# Patient Record
Sex: Male | Born: 1935 | Race: White | Hispanic: No | Marital: Married | Smoking: Former smoker
Health system: Southern US, Community
[De-identification: ages and names within clinical notes are randomized; demographics above are authoritative.]

## PROBLEM LIST (undated history)

## (undated) ENCOUNTER — Emergency Department (HOSPITAL_COMMUNITY): Payer: No Typology Code available for payment source | Source: Home / Self Care

## (undated) DIAGNOSIS — E119 Type 2 diabetes mellitus without complications: Secondary | ICD-10-CM

## (undated) DIAGNOSIS — R350 Frequency of micturition: Secondary | ICD-10-CM

## (undated) DIAGNOSIS — I10 Essential (primary) hypertension: Secondary | ICD-10-CM

## (undated) DIAGNOSIS — N400 Enlarged prostate without lower urinary tract symptoms: Secondary | ICD-10-CM

## (undated) DIAGNOSIS — I2510 Atherosclerotic heart disease of native coronary artery without angina pectoris: Secondary | ICD-10-CM

## (undated) DIAGNOSIS — R011 Cardiac murmur, unspecified: Secondary | ICD-10-CM

## (undated) DIAGNOSIS — K219 Gastro-esophageal reflux disease without esophagitis: Secondary | ICD-10-CM

## (undated) DIAGNOSIS — E7800 Pure hypercholesterolemia, unspecified: Secondary | ICD-10-CM

## (undated) DIAGNOSIS — H409 Unspecified glaucoma: Secondary | ICD-10-CM

## (undated) HISTORY — PX: BACK SURGERY: SHX140

## (undated) HISTORY — PX: LEG SURGERY: SHX1003

## (undated) HISTORY — PX: HERNIA REPAIR: SHX51

---

## 2000-08-19 ENCOUNTER — Emergency Department (HOSPITAL_COMMUNITY): Admission: EM | Admit: 2000-08-19 | Discharge: 2000-08-20 | Payer: Self-pay | Admitting: Emergency Medicine

## 2000-09-01 ENCOUNTER — Encounter: Payer: Self-pay | Admitting: Neurosurgery

## 2000-09-02 ENCOUNTER — Encounter: Payer: Self-pay | Admitting: Neurosurgery

## 2000-09-02 ENCOUNTER — Inpatient Hospital Stay (HOSPITAL_COMMUNITY): Admission: RE | Admit: 2000-09-02 | Discharge: 2000-09-03 | Payer: Self-pay | Admitting: Neurosurgery

## 2000-11-15 ENCOUNTER — Encounter: Payer: Self-pay | Admitting: Orthopaedic Surgery

## 2000-11-15 ENCOUNTER — Encounter: Admission: RE | Admit: 2000-11-15 | Discharge: 2000-11-15 | Payer: Self-pay | Admitting: Orthopaedic Surgery

## 2003-12-03 ENCOUNTER — Encounter (INDEPENDENT_AMBULATORY_CARE_PROVIDER_SITE_OTHER): Payer: Self-pay | Admitting: Specialist

## 2003-12-03 ENCOUNTER — Observation Stay (HOSPITAL_COMMUNITY): Admission: RE | Admit: 2003-12-03 | Discharge: 2003-12-04 | Payer: Self-pay | Admitting: General Surgery

## 2009-03-06 ENCOUNTER — Ambulatory Visit (HOSPITAL_COMMUNITY): Admission: RE | Admit: 2009-03-06 | Discharge: 2009-03-06 | Payer: Self-pay | Admitting: Orthopaedic Surgery

## 2010-10-02 LAB — COMPREHENSIVE METABOLIC PANEL
ALT: 20 U/L (ref 0–53)
AST: 30 U/L (ref 0–37)
Calcium: 9.6 mg/dL (ref 8.4–10.5)
GFR calc Af Amer: >60 mL/min (ref >60)
Sodium: 137 mEq/L (ref 135–145)
Total Protein: 7 g/dL (ref 6.0–8.3)

## 2010-10-02 LAB — GLUCOSE, CAPILLARY
Glucose-Capillary: 119 mg/dL — ABNORMAL HIGH (ref 70–99)
Glucose-Capillary: 73 mg/dL (ref 70–99)
Glucose-Capillary: 97 mg/dL (ref 70–99)

## 2010-10-02 LAB — CBC
HCT: 42.3 % (ref 39.0–52.0)
Hemoglobin: 14.2 g/dL (ref 13.0–17.0)
MCHC: 33.5 g/dL (ref 30.0–36.0)
MCV: 90 fL (ref 78.0–100.0)
Platelets: 275 10*3/uL (ref 150–400)
RBC: 4.7 MIL/uL (ref 4.22–5.81)
RDW: 15.1 % (ref 11.5–15.5)
WBC: 7.5 10*3/uL (ref 4.0–10.5)

## 2010-10-02 LAB — PROTIME-INR: INR: 0.9 (ref 0.00–1.49)

## 2010-10-02 LAB — DIFFERENTIAL
Eosinophils Absolute: 0.1 10*3/uL (ref 0.0–0.7)
Eosinophils Relative: 1 % (ref 0–5)
Lymphs Abs: 0.7 10*3/uL (ref 0.7–4.0)
Monocytes Relative: 12 % (ref 3–12)

## 2010-11-13 NOTE — H&P (Signed)
[place]  Patient:    Kurt Navarro, Kurt Navarro Visit Number: [id number] MRN: [id number]          Service Type: SUR Location: 3000 [id number] Attending Physician:  Danella Penton Admit Date:  09/02/2000                           History and Physical  BRIEF HISTORY:  Mr. Saetern is a gentleman who was in my office less than 1 week ago with a 1 month history of back pain with radiation down to the groin and then to the right leg which is getting worse.  What happened was, he was at work, he was crawling and doing some type of work underneath the house.  The pain was so severe up to the point that he was seen by his medical physician who proceeded with course of treatment without improvement.  Because of that an MRI was obtained and sent to [place] for evaluation.  He denies any pain in the left leg related to bowel or bladder.  PAST MEDICAL HISTORY:  About 3 years fracture the L1 with some stenosis at that level.  Off and on he complains of some back pain but the pain that he is having right now is completely different.  REVIEW OF SYSTEMS:  Positive for high blood pressure, leg pain, back pain.  He has a history of premature heart contractions but according to him his physician told him it is no big deal.  FAMILY HISTORY:  Mother is 75 and in good condition.  Father died at the age of 38 with a heart attack.  SOCIAL HISTORY:  Does not smoke, does not drink.  MEDICATIONS:  He is taking Cardura.  PHYSICAL EXAMINATION:  GENERAL:  The patient came to my office with main complaint in the right leg. He had quite a bit of difficulty undressing.  HEENT:  Normal.  NECK:  Normal.  NODES:  Clear.  HEART:  Sounds normal.  ABDOMEN:  Normal.  EXTREMITIES:  Normal pulses.  NEUROLOGIC:  Mental status:  Normal.  Cranial nerves:  Normal.  Strength is 5/5 except that in the right leg I found weakness of the right iliopsoas and the quadriceps.  Distal is  normal.  The left leg is normal.  Reflexes are symmetrical without decrease of the right knee jerk.  Sterile dressing on the left side is positive at 80, right side about 45 degrees.  Femoral stretch maneuver is highly positive on the right side and negative on the left side. The MRI showed that he has an old fracture of L1, but at the level of 3-4 especially going to the right side he has a large herniated disk extending into the body of L5 and also into the body of L4 affecting the L4 and L5 nerve root.  There is displacement of the thecal sac.  CLINICAL IMPRESSION: 1. Right L3-4 herniated disk. 2. Fracture of L1 - old.  RECOMMENDATIONS:  The patient wants to go ahead with surgery.  The procedure will be a right L3-4 diskectomy.  He knows about the risks, such as infection, CSF lead, worsening pain, paralysis, need for further surgery. Attending Physician:  Danella Penton DD:  09/02/00 TD:  09/03/00 Job: [id number]

## 2010-11-13 NOTE — Op Note (Signed)
[place] [place]  Patient:    Kurt Navarro, Kurt Navarro Visit Number: [id number] MRN: [id number]          Service Type: SUR Location: 3000 [id number] Attending Physician:  Danella Penton Proc. Date: 09/02/00 Admit Date:  09/02/2000 Discharge Date: 09/03/2000                             Operative Report  PREOPERATIVE DIAGNOSIS:   Right L3-L4 herniated disk with L3 fragment.  POSTOPERATIVE DIAGNOSIS:  Right L3-L4 herniated disk with L3 fragment.  OPERATION:  Right L3 hemilaminectomy, removal of L3 fragment, foraminotomy, decompression of the L3 nerve root.  Microscope.  SURGEON:  Tanya Nones. Jeral Fruit, M.D.  ASSISTANT:  Hewitt Shorts, M.D.  INDICATIONS:  [name] is admitted because of back and right leg pain.  He had weakness of because of back and right leg pain.  He had weakness of the quadriceps and the psoas.  MRI showed a large herniated disk at the level of L3-L4 with fragment going to the body of L4.  Surgery was advised.  The patient knew of the risk such as infection, CSF leak, worsening of the pain, paralysis and need for further surgery.  DESCRIPTION OF PROCEDURE:  The patient was taken to the operating room, properly positioned and placed on the monitor.  The lumbar area was prepped with Betadine.  A midline incision from L3 to L4 was made.  Muscles were retracted on the right side.  We took an x-ray that showed indeed we were at the level of L3.  From then on we went down and localized L3-L4.  By x-ray we knew there was a large herniated disk.  We proceeded with hemilaminectomy of L3 and partial of L4.  This was done with the microscope and the drill.  With the microscope, with microdissection we removed the yellow ligament.  We identified the L3 nerve root and later the L4.  Indeed, at the level of L3, there were at least four fragments going to the foramina affecting the L3 nerve root.  We went above into the shoulder of the nerve root  and we found at least two more.  We localized the L3-4 disk and indeed there was some bulging but there was no evidence of any opening.  Because of that, we decided not to do any diskectomy.  Foraminotomy with decompression of the L3-L4 nerve root was achieved.  Balfour maneuver was negative.  The wound was closed with Vicryl and nylon.  The patient did well. Attending Physician:  Danella Penton DD:  09/02/00 TD:  09/04/00 Job: [id number]

## 2010-11-13 NOTE — Op Note (Signed)
Kurt Navarro, Kurt Navarro                            ACCOUNT NO.:  [id number]   MEDICAL RECORD NO.:  [id number]                   PATIENT TYPE:  AMB   LOCATION:  DAY                                  FACILITY[facility]   PHYSICIAN:  Timothy E. Earlene Plater, M.D.              DATE OF BIRTH:  18-May-1927   DATE OF PROCEDURE:  12/03/2003  DATE OF DISCHARGE:                                 OPERATIVE REPORT   PREOPERATIVE DIAGNOSES:  1. Biliary dyskinesia.  2. Chronic cholecystitis.   PREOPERATIVE DIAGNOSIS:  1. Biliary dyskinesia.  2. Chronic cholecystitis.   OPERATION/PROCEDURE:  Laparoscopic cholecystectomy and operative  cholangiogram.   SURGEON:  Timothy E. Earlene Plater, M.D.   ASSISTANT:  Vikki Ports, M.D.   ANESTHESIA:  CRNA supervised by M.D.   INDICATIONS:  Kurt Navarro is 63.  Has been seen, evaluated and worked up by his  internist and gastroenterologist in [place] and found to have biliary  dyskinesia.  After review of his data and a careful discussion with him and  his family, he wishes to proceed with a laparoscopic cholecystectomy.  Today  his CBC and chemistry profile are normal.  He is seen, identified and the  permit signed.   DESCRIPTION OF PROCEDURE:  The patient is taken to the operating room and  placed supine. General endotracheal anesthesia administered.  The abdomen  was shaved, prepped and draped in the usual fashion.  Marcaine 0.25% with  epinephrine was used prior to each incision.   An infraumbilical incision made, the fascia identified, opened vertically,  peritoneum entered.  The Hasson catheter placed and tied in place with a #1  Vicryl.  General peritoneoscopy was unremarkable.  A second 10 mm trocar was  placed in the mid epigastrium.  Two 5 mm trocars in the right upper  quadrant.  The gallbladder was tense, did have some fatty adhesions.  It was  punctured by the first forceps applied on the dome.  The bile was suctioned  away.  The gallbladder was then  placed on traction.  Careful dissection at  the base of the gallbladder revealed a normal appearing, small cystic duct  entering the gallbladder.  This was completely dissected out with a full  window posteriorly and a clip applied on the gallbladder side of the cystic  duct.  An incision made in the cystic duct and a percutaneous catheter  placed into the cystic duct.  A real time cholangiogram was made showing  quick and rapid emptying of dye into the duodenum with full filling of the  normal biliary tree.  The clip and catheter were removed.  The stump of the  cystic duct was doubly clipped, the cystic duct divided.  The cystic artery  identified posteriorly, was dissected free, triply clipped and divided.  The  gallbladder was removed from the gallbladder bed without incident or  complication.  Bleeding was controlled with the  cautery.  The gallbladder  was placed in an EndoCatch bag and then removed through the infraumbilical  incision which was then tied under direct vision.  One small bleeder on the  peritoneum was cauterized.  Copious irrigation was carried out until all  bile was removed and irrigant was clear.  All sites were inspected and there  was no bleeding or complication.  All CO2, irrigant, instruments, and  trocars were removed under direct vision.  The fascial defect in the mid  epigastrium was closed with a single suture of 0 Vicryl.  Skin incisions  inspected and closed with 3-0 Monocryl.  Steri-Strips applied.  Final counts  correct.  He tolerated it well and was removed to the recovery room in good  condition.                                               Timothy E. Earlene Plater, M.D.    TED/MEDQ  D:  12/03/2003  T:  12/03/2003  Job:  [id number]   cc:   Renae Fickle  [address]  [place]  [place] [address]  Fax: [phone]   [facility]  [address]  [place]  [place] [address]  Fax: [phone]

## 2011-01-21 ENCOUNTER — Inpatient Hospital Stay (HOSPITAL_COMMUNITY)
Admission: EM | Admit: 2011-01-21 | Discharge: 2011-01-25 | DRG: 481 | Disposition: A | Discharge status: Skilled Nursing Facility | Payer: Medicare Other | Attending: Orthopedic Surgery | Admitting: Orthopedic Surgery

## 2011-01-21 ENCOUNTER — Emergency Department (HOSPITAL_COMMUNITY): Payer: Medicare Other

## 2011-01-21 DIAGNOSIS — S72143A Displaced intertrochanteric fracture of unspecified femur, initial encounter for closed fracture: Principal | ICD-10-CM | POA: Diagnosis present

## 2011-01-21 DIAGNOSIS — I10 Essential (primary) hypertension: Secondary | ICD-10-CM | POA: Diagnosis present

## 2011-01-21 DIAGNOSIS — E236 Other disorders of pituitary gland: Secondary | ICD-10-CM | POA: Diagnosis not present

## 2011-01-21 DIAGNOSIS — D62 Acute posthemorrhagic anemia: Secondary | ICD-10-CM | POA: Diagnosis not present

## 2011-01-21 DIAGNOSIS — Y92009 Unspecified place in unspecified non-institutional (private) residence as the place of occurrence of the external cause: Secondary | ICD-10-CM

## 2011-01-21 DIAGNOSIS — W010XXA Fall on same level from slipping, tripping and stumbling without subsequent striking against object, initial encounter: Secondary | ICD-10-CM | POA: Diagnosis present

## 2011-01-21 DIAGNOSIS — E039 Hypothyroidism, unspecified: Secondary | ICD-10-CM | POA: Diagnosis present

## 2011-01-21 DIAGNOSIS — E119 Type 2 diabetes mellitus without complications: Secondary | ICD-10-CM | POA: Diagnosis present

## 2011-01-21 LAB — COMPREHENSIVE METABOLIC PANEL
Albumin: 3.1 g/dL — ABNORMAL LOW (ref 3.5–5.2)
Alkaline Phosphatase: 57 U/L (ref 39–117)
BUN: 24 mg/dL — ABNORMAL HIGH (ref 6–23)
CO2: 24 mEq/L (ref 19–32)
Chloride: 97 mEq/L (ref 96–112)
Creatinine, Ser: 0.86 mg/dL (ref 0.50–1.35)
GFR calc non Af Amer: >60 mL/min (ref >60)
Potassium: 4.6 mEq/L (ref 3.5–5.1)
Total Bilirubin: 0.2 mg/dL — ABNORMAL LOW (ref 0.3–1.2)

## 2011-01-21 LAB — URINALYSIS, ROUTINE W REFLEX MICROSCOPIC
Bilirubin Urine: NEGATIVE
Ketones, ur: NEGATIVE mg/dL
Leukocytes, UA: NEGATIVE
Nitrite: NEGATIVE
Protein, ur: NEGATIVE mg/dL
Urobilinogen, UA: 0.2 mg/dL (ref 0.0–1.0)
pH: 7 (ref 5.0–8.0)

## 2011-01-21 LAB — DIFFERENTIAL
Basophils Absolute: 0 10*3/uL (ref 0.0–0.1)
Eosinophils Absolute: 0.1 10*3/uL (ref 0.0–0.7)
Lymphocytes Relative: 7 % — ABNORMAL LOW (ref 12–46)
Lymphs Abs: 0.6 10*3/uL — ABNORMAL LOW (ref 0.7–4.0)
Monocytes Relative: 11 % (ref 3–12)

## 2011-01-21 LAB — CBC
MCH: 29.3 pg (ref 26.0–34.0)
MCHC: 33.9 g/dL (ref 30.0–36.0)
Platelets: 225 10*3/uL (ref 150–400)
RBC: 4.16 MIL/uL — ABNORMAL LOW (ref 4.22–5.81)
RDW: 15 % (ref 11.5–15.5)

## 2011-01-21 LAB — ABO/RH: ABO/RH(D): A NEG

## 2011-01-22 ENCOUNTER — Inpatient Hospital Stay (HOSPITAL_COMMUNITY): Payer: Medicare Other

## 2011-01-22 LAB — HEMOGLOBIN A1C: Mean Plasma Glucose: 166 mg/dL — ABNORMAL HIGH (ref <117)

## 2011-01-22 LAB — GLUCOSE, CAPILLARY
Glucose-Capillary: 183 mg/dL — ABNORMAL HIGH (ref 70–99)
Glucose-Capillary: 145 mg/dL — ABNORMAL HIGH (ref 70–99)
Glucose-Capillary: 158 mg/dL — ABNORMAL HIGH (ref 70–99)
Glucose-Capillary: 129 mg/dL — ABNORMAL HIGH (ref 70–99)
Glucose-Capillary: 139 mg/dL — ABNORMAL HIGH (ref 70–99)
Glucose-Capillary: 121 mg/dL — ABNORMAL HIGH (ref 70–99)
Glucose-Capillary: 194 mg/dL — ABNORMAL HIGH (ref 70–99)

## 2011-01-22 LAB — SURGICAL PCR SCREEN: MRSA, PCR: NEGATIVE

## 2011-01-22 LAB — APTT: aPTT: 29 seconds (ref 24–37)

## 2011-01-23 LAB — PROTIME-INR: Prothrombin Time: 15.1 seconds (ref 11.6–15.2)

## 2011-01-23 LAB — GLUCOSE, CAPILLARY
Glucose-Capillary: 148 mg/dL — ABNORMAL HIGH (ref 70–99)
Glucose-Capillary: 155 mg/dL — ABNORMAL HIGH (ref 70–99)

## 2011-01-23 LAB — URINE CULTURE

## 2011-01-23 LAB — BASIC METABOLIC PANEL
Calcium: 8.4 mg/dL (ref 8.4–10.5)
GFR calc Af Amer: >60 mL/min (ref >60)
GFR calc non Af Amer: >60 mL/min (ref >60)
Glucose, Bld: 159 mg/dL — ABNORMAL HIGH (ref 70–99)
Sodium: 127 mEq/L — ABNORMAL LOW (ref 135–145)

## 2011-01-23 LAB — CBC
MCV: 86.2 fL (ref 78.0–100.0)
Platelets: 227 10*3/uL (ref 150–400)
RDW: 14.6 % (ref 11.5–15.5)
WBC: 10.3 10*3/uL (ref 4.0–10.5)

## 2011-01-23 LAB — PREPARE RBC (CROSSMATCH)

## 2011-01-23 NOTE — Op Note (Signed)
NAMEADYN, Kurt Navarro                ACCOUNT NO.:  [id number]  MEDICAL RECORD NO.:  [id number]  LOCATION:  1601                         FACILITY:  [facility]  PHYSICIAN:  Eulas Post, MD    DATE OF BIRTH:  05/07/1927  DATE OF PROCEDURE:  01/22/2011 DATE OF DISCHARGE:                              OPERATIVE REPORT   ATTENDING SURGEON:  Eulas Post, MD  ASSISTANT:  Kurt Navarro, OPA, orthopedic PA-C  PREOPERATIVE DIAGNOSIS:  Right intertrochanteric hip fracture.  POSTOPERATIVE DIAGNOSIS:  Right intertrochanteric hip fracture.  OPERATIVE PROCEDURE:  Right hip trochanteric femoral nail.  ANESTHESIA:  General.  ESTIMATED BLOOD LOSS:  75 cc.  OPERATIVE IMPLANTS:  Synthes size 11 trochanteric femoral nail with a 95 mm helical blade.  PREOPERATIVE INDICATIONS:  Mr. Kurt Navarro is an 77 year old gentleman who fell and broke his right hip.  He elected for surgical treatment. The risks, benefits, and alternatives were discussed with him before the procedure including but not limited to risks of infection, bleeding, nerve injury, malunion, nonunion, hardware prominence, hardware failure, the need for hardware removal, blood clots, cardiopulmonary complications, failure to regain ambulatory function, among others and he is willing to proceed.  OPERATIVE PROCEDURE:  The patient brought to the operating room, placed in supine position.  IV antibiotics were given.  General anesthesia was administered.  He was placed on the fracture table.  The right lower extremity was closed reduced using C-arm guidance.  The right lower extremity was then prepped and draped in usual sterile fashion. Incision was made proximal to the greater trochanter.  A guidewire was introduced in the appropriate location in the femur.  I opened the femur with the reamer and then measured the length of the nail.  The nail was selected and placed down by hand.  I did use a mallet at the end to completely seat  the nail, and it had reasonably good interference fit. I confirmed reduction on the lateral view and then placed a guide pin into the center-center location.  A 95 mm helical blade was selected.  I opened the lateral cortex and then reamed up into the head in order to prepare the path.  Anatomic reduction was achieved.  I placed the helical blade and had excellent support and fit and I did not need any distal interlocking screws due to the stability of the fracture as well as the interference fit of the nail within the medullary cortex.  The length was confirmed and final C-arm pictures were taken and I secured the sliding portion of the nail proximally and then backed it off half of turn and then removed all the instruments.  The wounds were irrigated copiously and the subcutaneous tissue was closed with Vicryl followed by Monocryl with Steri-Strips.  Sterile gauze for the skin.  The patient was awakened and returned to PACU in stable and satisfactory condition.  Kurt Navarro, orthopedic PA-C, was present and scrubbed throughout the case and critical for assistance with reduction as well as manipulation, instrumentation, and closure.  He tolerated the procedure well and there were no complications.  He will be weightbearing as tolerated and will have Lovenox transitioning to Coumadin for  a period of 1 month postoperatively.     Eulas Post, MD     JPL/MEDQ  D:  01/22/2011  T:  01/22/2011  Job:  [id number]  Electronically Signed by Teryl Lucy MD on 01/23/2011 09:47:49 AM

## 2011-01-24 LAB — CBC
MCH: 30.2 pg (ref 26.0–34.0)
MCHC: 35.5 g/dL (ref 30.0–36.0)
Platelets: 190 10*3/uL (ref 150–400)
RBC: 3.38 MIL/uL — ABNORMAL LOW (ref 4.22–5.81)

## 2011-01-24 LAB — TYPE AND SCREEN: Unit division: 0

## 2011-01-24 LAB — GLUCOSE, CAPILLARY: Glucose-Capillary: 178 mg/dL — ABNORMAL HIGH (ref 70–99)

## 2011-01-24 LAB — BASIC METABOLIC PANEL
BUN: 10 mg/dL (ref 6–23)
Creatinine, Ser: 0.68 mg/dL (ref 0.50–1.35)
GFR calc Af Amer: >60 mL/min (ref >60)
GFR calc non Af Amer: >60 mL/min (ref >60)

## 2011-01-24 LAB — PROTIME-INR: Prothrombin Time: 16.2 seconds — ABNORMAL HIGH (ref 11.6–15.2)

## 2011-01-25 LAB — GLUCOSE, CAPILLARY
Glucose-Capillary: 170 mg/dL — ABNORMAL HIGH (ref 70–99)
Glucose-Capillary: 203 mg/dL — ABNORMAL HIGH (ref 70–99)
Glucose-Capillary: 227 mg/dL — ABNORMAL HIGH (ref 70–99)
Glucose-Capillary: 173 mg/dL — ABNORMAL HIGH (ref 70–99)

## 2011-01-25 LAB — BASIC METABOLIC PANEL
BUN: 16 mg/dL (ref 6–23)
Chloride: 99 mEq/L (ref 96–112)
GFR calc Af Amer: >60 mL/min (ref >60)
Glucose, Bld: 162 mg/dL — ABNORMAL HIGH (ref 70–99)
Potassium: 4.3 mEq/L (ref 3.5–5.1)

## 2011-01-28 NOTE — Discharge Summary (Signed)
  NAMEAUBRY, Kurt Navarro                ACCOUNT NO.:  [id number]  MEDICAL RECORD NO.:  [id number]  LOCATION:  1601                         FACILITY:  [facility]  PHYSICIAN:  Eulas Post, MD    DATE OF BIRTH:  1926-12-06  DATE OF ADMISSION:  01/21/2011 DATE OF DISCHARGE:                        DISCHARGE SUMMARY - REFERRING   DATE OF ANTICIPATED DISCHARGE:  January 25, 2011.  ADMISSION DIAGNOSES: 1. Right intertrochanteric hip fracture. 2. History of diabetes. 3. Hypertension. 4. Hypothyroidism. 5. Lumbar compression fracture. 6. History of right shoulder arthroscopy. 7. History of lumbar spine surgery.  DISCHARGE DIAGNOSES: 1. Right intertrochanteric hip fracture. 2. History of diabetes. 3. Hypertension. 4. Hypothyroidism. 5. Lumbar compression fracture. 6. History of right shoulder arthroscopy. 7. History of lumbar spine surgery.  Additional diagnoses during the hospital:  Acute postoperative blood loss anemia and hyponatremia with SIADH.  HOSPITAL COURSE:  Mr. Kurt Navarro is an 77 year old gentleman who fell and broke his right hip.  He had an intertrochanteric hip fracture.  He underwent intramedullary nailing and tolerated the procedure well. Postoperatively, he did have acute postoperative blood loss anemia with hemoglobin going down to the low 8s on postoperative day #1 and he was given 2 units of packed red blood cells.  This corrected his hemoglobin appropriately.  He also had progressive decline in his sodium, down to 123.  He had fluid restriction implemented, and followup sodiums are pending.  His dressings were clean and dry and his neurovascular function in his right lower extremity was intact.  He was given perioperative antibiotics and antimicrobial prophylaxis.  He was given sequential compression devices and Lovenox bridging with Coumadin for 1 month for DVT prophylaxis.  He was working with physical therapy, weightbearing as tolerated and making steady  progress.  He is planned be discharged to skilled nursing facility tomorrow.  He will follow up with me in 2 weeks.  No known complications.  He benefited maximally from his hospital stay.     Eulas Post, MD     JPL/MEDQ  D:  01/24/2011  T:  01/24/2011  Job:  [id number]  cc:   Claude Manges. Cleophas Dunker, M.D. Fax: [phone]  Electronically Signed by Teryl Lucy MD on 01/28/2011 11:39:33 AM

## 2014-04-28 ENCOUNTER — Encounter (HOSPITAL_COMMUNITY): Payer: Self-pay | Admitting: Emergency Medicine

## 2014-04-28 ENCOUNTER — Emergency Department (HOSPITAL_COMMUNITY)
Admission: EM | Admit: 2014-04-28 | Discharge: 2014-04-28 | Disposition: A | Discharge status: Home / Self Care | Payer: No Typology Code available for payment source | Attending: Emergency Medicine | Admitting: Emergency Medicine

## 2014-04-28 ENCOUNTER — Emergency Department (HOSPITAL_COMMUNITY): Payer: No Typology Code available for payment source

## 2014-04-28 DIAGNOSIS — R011 Cardiac murmur, unspecified: Secondary | ICD-10-CM | POA: Insufficient documentation

## 2014-04-28 DIAGNOSIS — Y92410 Unspecified street and highway as the place of occurrence of the external cause: Secondary | ICD-10-CM | POA: Insufficient documentation

## 2014-04-28 DIAGNOSIS — Z8639 Personal history of other endocrine, nutritional and metabolic disease: Secondary | ICD-10-CM | POA: Diagnosis not present

## 2014-04-28 DIAGNOSIS — S29001A Unspecified injury of muscle and tendon of front wall of thorax, initial encounter: Secondary | ICD-10-CM | POA: Diagnosis not present

## 2014-04-28 DIAGNOSIS — E119 Type 2 diabetes mellitus without complications: Secondary | ICD-10-CM | POA: Insufficient documentation

## 2014-04-28 DIAGNOSIS — Y9389 Activity, other specified: Secondary | ICD-10-CM | POA: Insufficient documentation

## 2014-04-28 DIAGNOSIS — I10 Essential (primary) hypertension: Secondary | ICD-10-CM | POA: Diagnosis not present

## 2014-04-28 HISTORY — DX: Type 2 diabetes mellitus without complications: E11.9

## 2014-04-28 HISTORY — DX: Pure hypercholesterolemia, unspecified: E78.00

## 2014-04-28 HISTORY — DX: Essential (primary) hypertension: I10
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
## 2014-04-28 NOTE — Discharge Instructions (Signed)

## 2014-12-05 ENCOUNTER — Other Ambulatory Visit: Payer: Self-pay | Admitting: Urology

## 2014-12-05 NOTE — Progress Notes (Signed)
Please release orders in Epic to sign and held surgery 12-11-13 pre op 12-11-14 Thanks

## 2014-12-10 ENCOUNTER — Other Ambulatory Visit (HOSPITAL_COMMUNITY): Payer: Self-pay | Admitting: *Deleted

## 2014-12-10 NOTE — Progress Notes (Signed)
ekg 11-27-14 dr Thea Silversmith on chart Medical clearance/lov note dr Thea Silversmith on chart for 12-12-14 surgery

## 2014-12-10 NOTE — Patient Instructions (Addendum)
Kurt Navarro  12/10/2014  Your procedure is scheduled on: 12-12-14  Report to [facility] Main  Entrance and follow signs to               Short Stay Center at  700 AM.  Call this number if you have problems the morning of surgery [phone]   Remember: ONLY 1 PERSON MAY GO WITH YOU TO SHORT STAY TO GET  READY MORNING OF YOUR SURGERY.  Do not eat food or drink liquids :After Midnight.     Take these medicines the morning of surgery with A SIP OF WATER: Levothyroxine, Omeprazole, Tamsulosin                               You may not have any metal on your body including hair pins and              piercings  Do not wear jewelry, make-up, lotions, powders or perfumes, deodorant             Do not wear nail polish.  Do not shave  48 hours prior to surgery.              Men may shave face and neck.   Do not bring valuables to the hospital. [place] IS NOT             RESPONSIBLE   FOR VALUABLES.  Contacts, dentures or bridgework may not be worn into surgery.  Leave suitcase in the car. After surgery it may be brought to your room.     Patients discharged the day of surgery will not be allowed to drive home.  Name and phone number of your driver: son timathy mixell cell [phone]  Special Instructions: N/A              Please read over the following fact sheets you were given: _____________________________________________________________________             [facility] - Preparing for Surgery Before surgery, you can play an important role.  Because skin is not sterile, your skin needs to be as free of germs as possible.  You can reduce the number of germs on your skin by washing with CHG (chlorahexidine gluconate) soap before surgery.  CHG is an antiseptic cleaner which kills germs and bonds with the skin to continue killing germs even after washing. Please DO NOT use if you have an allergy to CHG or antibacterial soaps.  If your skin becomes  reddened/irritated stop using the CHG and inform your nurse when you arrive at Short Stay. Do not shave (including legs and underarms) for at least 48 hours prior to the first CHG shower.  You may shave your face/neck. Please follow these instructions carefully:  1.  Shower with CHG Soap the night before surgery and the  morning of Surgery.  2.  If you choose to wash your hair, wash your hair first as usual with your  normal  shampoo.  3.  After you shampoo, rinse your hair and body thoroughly to remove the  shampoo.                           4.  Use CHG as you would any other liquid soap.  You can apply chg directly  to the skin  and wash                       Gently with a scrungie or clean washcloth.  5.  Apply the CHG Soap to your body ONLY FROM THE NECK DOWN.   Do not use on face/ open                           Wound or open sores. Avoid contact with eyes, ears mouth and genitals (private parts).                       Wash face,  Genitals (private parts) with your normal soap.             6.  Wash thoroughly, paying special attention to the area where your surgery  will be performed.  7.  Thoroughly rinse your body with warm water from the neck down.  8.  DO NOT shower/wash with your normal soap after using and rinsing off  the CHG Soap.                9.  Pat yourself dry with a clean towel.            10.  Wear clean pajamas.            11.  Place clean sheets on your bed the night of your first shower and do not  sleep with pets. Day of Surgery : Do not apply any lotions/deodorants the morning of surgery.  Please wear clean clothes to the hospital/surgery center.  FAILURE TO FOLLOW THESE INSTRUCTIONS MAY RESULT IN THE CANCELLATION OF YOUR SURGERY PATIENT SIGNATURE_________________________________  NURSE SIGNATURE__________________________________  ________________________________________________________________________

## 2014-12-11 ENCOUNTER — Encounter (HOSPITAL_COMMUNITY)
Admission: RE | Admit: 2014-12-11 | Discharge: 2014-12-11 | Disposition: A | Discharge status: Home / Self Care | Payer: Medicare Other | Source: Ambulatory Visit | Attending: Urology | Admitting: Urology

## 2014-12-11 ENCOUNTER — Encounter (HOSPITAL_COMMUNITY): Payer: Self-pay

## 2014-12-11 DIAGNOSIS — N3281 Overactive bladder: Secondary | ICD-10-CM | POA: Diagnosis not present

## 2014-12-11 DIAGNOSIS — N3941 Urge incontinence: Secondary | ICD-10-CM | POA: Diagnosis not present

## 2014-12-11 DIAGNOSIS — Z791 Long term (current) use of non-steroidal anti-inflammatories (NSAID): Secondary | ICD-10-CM | POA: Diagnosis not present

## 2014-12-11 DIAGNOSIS — Z947 Corneal transplant status: Secondary | ICD-10-CM | POA: Diagnosis not present

## 2014-12-11 DIAGNOSIS — I10 Essential (primary) hypertension: Secondary | ICD-10-CM | POA: Diagnosis not present

## 2014-12-11 DIAGNOSIS — I2510 Atherosclerotic heart disease of native coronary artery without angina pectoris: Secondary | ICD-10-CM | POA: Diagnosis not present

## 2014-12-11 DIAGNOSIS — K219 Gastro-esophageal reflux disease without esophagitis: Secondary | ICD-10-CM | POA: Diagnosis not present

## 2014-12-11 DIAGNOSIS — Z79899 Other long term (current) drug therapy: Secondary | ICD-10-CM | POA: Diagnosis not present

## 2014-12-11 DIAGNOSIS — E119 Type 2 diabetes mellitus without complications: Secondary | ICD-10-CM | POA: Diagnosis not present

## 2014-12-11 DIAGNOSIS — R312 Other microscopic hematuria: Secondary | ICD-10-CM | POA: Diagnosis present

## 2014-12-11 DIAGNOSIS — Z87891 Personal history of nicotine dependence: Secondary | ICD-10-CM | POA: Diagnosis not present

## 2014-12-11 DIAGNOSIS — H409 Unspecified glaucoma: Secondary | ICD-10-CM | POA: Diagnosis not present

## 2014-12-11 DIAGNOSIS — N3289 Other specified disorders of bladder: Secondary | ICD-10-CM | POA: Diagnosis not present

## 2014-12-11 DIAGNOSIS — N3011 Interstitial cystitis (chronic) with hematuria: Secondary | ICD-10-CM | POA: Diagnosis not present

## 2014-12-11 HISTORY — DX: Gastro-esophageal reflux disease without esophagitis: K21.9

## 2014-12-11 HISTORY — DX: Unspecified glaucoma: H40.9

## 2014-12-11 HISTORY — DX: Frequency of micturition: R35.0

## 2014-12-11 HISTORY — DX: Benign prostatic hyperplasia without lower urinary tract symptoms: N40.0

## 2014-12-11 HISTORY — DX: Cardiac murmur, unspecified: R01.1

## 2014-12-11 HISTORY — DX: Atherosclerotic heart disease of native coronary artery without angina pectoris: I25.10

## 2014-12-11 LAB — CBC
HCT: 38.1 % — ABNORMAL LOW (ref 39.0–52.0)
Hemoglobin: 12.4 g/dL — ABNORMAL LOW (ref 13.0–17.0)
MCH: 29.5 pg (ref 26.0–34.0)
MCHC: 32.5 g/dL (ref 30.0–36.0)
MCV: 90.5 fL (ref 78.0–100.0)
PLATELETS: 283 10*3/uL (ref 150–400)
RBC: 4.21 MIL/uL — ABNORMAL LOW (ref 4.22–5.81)
RDW: 13.6 % (ref 11.5–15.5)
WBC: 8.2 10*3/uL (ref 4.0–10.5)

## 2014-12-11 LAB — BASIC METABOLIC PANEL
Anion gap: 7 (ref 5–15)
BUN: 25 mg/dL — AB (ref 6–20)
CALCIUM: 9.1 mg/dL (ref 8.9–10.3)
CO2: 26 mmol/L (ref 22–32)
CREATININE: 0.82 mg/dL (ref 0.61–1.24)
Chloride: 98 mmol/L — ABNORMAL LOW (ref 101–111)
Glucose, Bld: 144 mg/dL — ABNORMAL HIGH (ref 65–99)
POTASSIUM: 5 mmol/L (ref 3.5–5.1)
Sodium: 131 mmol/L — ABNORMAL LOW (ref 135–145)

## 2014-12-11 MED ORDER — GENTAMICIN SULFATE 40 MG/ML IJ SOLN
320.0000 mg | Freq: Once | INTRAVENOUS | Status: AC
  Administered 2014-12-12: 320 mg via INTRAVENOUS
  Filled 2014-12-11: qty 8

## 2014-12-11 NOTE — Progress Notes (Signed)
bmet results faxed to dr wrenn by epic 

## 2014-12-11 NOTE — H&P (Signed)
Active Problems Problems  1. Microscopic hematuria (R31.2) 2. Nocturia (R35.1) 3. Pyuria (N39.0) 4. Urge incontinence of urine (N39.41) 5. Urinary urgency (R39.15)  History of Present Illness Kurt Navarro is an 81 yo WM who is sent in consulation by Dr. Thea Silversmith for OAB and pyuria.  He has a several year history of frequency but has been getting progressively worse. He has frequency q61min and has urgency with UUI. He has nocturia q1hrs. He has no hesitancy and reports a good stream.  He feels like he empties. He has no dysuria or hematuria. He has been on tamsulosin for several years.  He has had no UTI's. He has had no stones. He has had no GU surgery. He has had some pyuria on a recent UA but a negative culture. His UA today has 3-6 RBC's and 3-6 WBC's.  His PSA is 0.14.  He has CKD 3.   Past Medical History Problems  1. History of Benign heart murmur (R01.0) 2. History of diabetes mellitus (Z86.39) 3. History of esophageal reflux (Z87.19) 4. History of glaucoma (Z86.69) 5. History of Tremor (R25.1)  Surgical History Problems  1. History of Cornea Transplant 2. History of Laminectomy Lumbar 3. History of Tonsillectomy  Current Meds 1. Ecotrin 325 MG Oral Tablet Delayed Release;  Therapy: (Recorded:25May2016) to Recorded 2. ICaps MV TABS;  Therapy: (Recorded:25May2016) to Recorded 3. Iron TABS;  Therapy: (Recorded:25May2016) to Recorded 4. Levothyroxine Sodium 50 MCG Oral Tablet;  Therapy: (Recorded:25May2016) to Recorded 5. Losartan Potassium 100 MG Oral Tablet;  Therapy: (Recorded:25May2016) to Recorded 6. MetFORMIN HCl ER 500 MG Oral Tablet Extended Release 24 Hour;  Therapy: (Recorded:25May2016) to Recorded 7. Multi-Vitamin TABS;  Therapy: (Recorded:25May2016) to Recorded 8. Omeprazole 20 MG Oral Capsule Delayed Release;  Therapy: (Recorded:25May2016) to Recorded 9. Pravastatin Sodium 20 MG Oral Tablet;  Therapy: (Recorded:25May2016) to Recorded 10. Tamsulosin HCl -  0.4 MG Oral Capsule;   Therapy: (Recorded:25May2016) to Recorded 11. Travatan Z 0.004 % Ophthalmic Solution;   Therapy: (Recorded:25May2016) to Recorded  Allergies Medication  1. No Known Drug Allergies  Family History Problems  1. No pertinent family history : Mother  Social History Problems    Death in the family, father   Death in the family, mother   age 57   Former smoker (Z87.891)   Quit in 40   Married   No alcohol use   No caffeine use   Number of children   1 Son & 1 Daughter   Occupation   Retired  Review of Building surveyor, constitutional, skin, eye, otolaryngeal, hematologic/lymphatic, cardiovascular, pulmonary, endocrine, musculoskeletal, gastrointestinal, neurological and psychiatric system(s) were reviewed and pertinent findings if present are noted and are otherwise negative.  Genitourinary: urinary frequency, feelings of urinary urgency, nocturia and incontinence.  Gastrointestinal: (He has some rectal urgency with encopresis on occasion).  Eyes: blurred vision.  Hematologic/Lymphatic: a tendency to easily bruise.    Vitals Vital Signs [Data Includes: Last 1 Day]  Recorded: 25May2016 01:59PM  Height: 5 ft 7 in Weight: 150 lb  BMI Calculated: 23.49 BSA Calculated: 1.79 Blood Pressure: 146 / 81 Temperature: 98.1 F Heart Rate: 84  Physical Exam Constitutional: Well nourished and well developed . No acute distress. Elderly and frail with a mild tremor.  ENT:. The ears and nose are normal in appearance.  Neck: The appearance of the neck is normal and no neck mass is present.  Pulmonary: No respiratory distress and normal respiratory rhythm and effort.  Cardiovascular: Heart rate and rhythm are normal .  No peripheral edema. A grade 3/6 systolic murmur was heard.  Abdomen: The abdomen is soft and nontender. No masses are palpated. No CVA tenderness. No hernias are palpable. No hepatosplenomegaly noted.  Rectal: Rectal exam  demonstrates normal sphincter tone, no tenderness and no masses. Estimated prostate size is 2+. The prostate has no nodularity and is not tender. The left seminal vesicle is nonpalpable. The right seminal vesicle is nonpalpable. The perineum is normal on inspection.  Genitourinary: Examination of the penis demonstrates no discharge, no masses, no lesions and a normal meatus. The scrotum is without lesions. Examination of the right scrotum demonstrates a hydrocele. Examination of the left scrotum demostrates a hydrocele. The left testis is non-tender and without masses.  Lymphatics: The supraclavicular, femoral and inguinal nodes are not enlarged or tender.  Skin: Normal skin turgor, no visible rash and no visible skin lesions.  Neuro/Psych:. Mood and affect are appropriate. Normal sensation of the perineum/perianal region (S3,4,5). He is slow to stand and has a tremor in the left hand but no focal deficits.    Results/Data Urine [Data Includes: Last 1 Day]   25May2016  COLOR YELLOW   APPEARANCE CLEAR   SPECIFIC GRAVITY 1.015   pH 7.0   GLUCOSE NEG mg/dL  BILIRUBIN NEG   KETONE NEG mg/dL  BLOOD TRACE   PROTEIN NEG mg/dL  UROBILINOGEN 0.2 mg/dL  NITRITE NEG   LEUKOCYTE ESTERASE NEG   SQUAMOUS EPITHELIAL/HPF RARE   WBC 3-6 WBC/hpf  RBC 3-6 RBC/hpf  BACTERIA RARE   CRYSTALS NONE SEEN   CASTS NONE SEEN    Old records or history reviewed: I have reviewed records and labs from Dr. Thea Silversmith.  The following clinical lab reports were reviewed:  UA reviewed.  Flow Rate: Voided 329 ml. A peak flow rate of 55ml/s, mean flow rate of 61ml/s and variable but sustained. flow curve .  PVR: Ultrasound PVR 153 ml. But he didn't think he emptied as well as he could since he was getting a specimen.    Procedure  Procedure: Cystoscopy   Indication: Lower Urinary Tract Symptoms.  Informed Consent: Risks, benefits, and potential adverse events were discussed and informed consent was obtained from the  patient.  Prep: The patient was prepped with betadine.  Antibiotic prophylaxis:. Antibiotic not given since he had prior c. diff.  Procedure Note:  Urethral meatus:. No abnormalities.  Anterior urethra: No abnormalities.  Prostatic urethra: No abnormalities . Estimated length was 2 cm. There was visual obstruction of the prostatic urethra. The lateral prostatic lobes were enlarged. No intravesical median lobe was visualized.  Bladder: Visulization was clear. The ureteral orifices were in the normal anatomic position bilaterally and had clear efflux of urine. A systematic survey of the bladder demonstrated no bladder tumors or stones. Examination of the bladder demonstrated mild trabeculation erythematous mucosa (There is patchy erythema on the posterior wall that is worrisome for CIS vis chronic inflammation/Hunner's ulcer. ). The patient tolerated the procedure well.  Complications: None.    Assessment Assessed  1. Urge incontinence of urine (N39.41) 2. Urinary urgency (R39.15) 3. Nocturia (R35.1) 4. Pyuria (N39.0) 5. Microscopic hematuria (R31.2)  He has OAB wet with posterior bladder wall lesions.  He has a small prostate but may have some obstruction.   Plan Health Maintenance  1. UA With REFLEX; [Do Not Release]; Status:Resulted - Requires Verification;   Done:  25May2016 01:29PM Microscopic hematuria  2. Follow-up Schedule Surgery Office  Follow-up  Status: Complete  Done: 25May2016 3. URINE  CYTOLOGY; Status:In Progress - Specimen/Data Collected;   Done: 25May2016 Urge incontinence of urine  4. Complex Uroflowmetry; Status:Complete;   Done: 25May2016 5. Cysto; Status:Hold For - Appointment,Date of Service; Requested for:25May2016;  6. PVR U/S; Status:Complete;   Done: 25May2016 7. URINE CULTURE; Status:In Progress - Specimen/Data Collected;   Done: 25May2016  I am going to set him up for cystoscopy with bilateral retrogrades, HOD and bladder biopsy with fulguration. The risks  of bleeding, infection, bladder injury, thrombotic events and anesthetic risks reviewed.  I will get him cleared by Dr. Thea Silversmith.   Urine culture and cytology sent today.

## 2014-12-12 ENCOUNTER — Encounter (HOSPITAL_COMMUNITY)
Admission: RE | Disposition: A | Discharge status: Home / Self Care | Payer: Self-pay | Source: Ambulatory Visit | Attending: Urology

## 2014-12-12 ENCOUNTER — Encounter (HOSPITAL_COMMUNITY): Payer: Self-pay | Admitting: *Deleted

## 2014-12-12 ENCOUNTER — Ambulatory Visit (HOSPITAL_COMMUNITY): Payer: Medicare Other | Admitting: Certified Registered Nurse Anesthetist

## 2014-12-12 ENCOUNTER — Ambulatory Visit (HOSPITAL_COMMUNITY)
Admission: RE | Admit: 2014-12-12 | Discharge: 2014-12-12 | Disposition: A | Discharge status: Home / Self Care | Payer: Medicare Other | Source: Ambulatory Visit | Attending: Urology | Admitting: Urology

## 2014-12-12 DIAGNOSIS — N3289 Other specified disorders of bladder: Secondary | ICD-10-CM | POA: Diagnosis not present

## 2014-12-12 DIAGNOSIS — E119 Type 2 diabetes mellitus without complications: Secondary | ICD-10-CM | POA: Insufficient documentation

## 2014-12-12 DIAGNOSIS — Z947 Corneal transplant status: Secondary | ICD-10-CM | POA: Insufficient documentation

## 2014-12-12 DIAGNOSIS — N3281 Overactive bladder: Secondary | ICD-10-CM | POA: Diagnosis not present

## 2014-12-12 DIAGNOSIS — I2510 Atherosclerotic heart disease of native coronary artery without angina pectoris: Secondary | ICD-10-CM | POA: Insufficient documentation

## 2014-12-12 DIAGNOSIS — Z87891 Personal history of nicotine dependence: Secondary | ICD-10-CM | POA: Insufficient documentation

## 2014-12-12 DIAGNOSIS — I10 Essential (primary) hypertension: Secondary | ICD-10-CM | POA: Insufficient documentation

## 2014-12-12 DIAGNOSIS — H409 Unspecified glaucoma: Secondary | ICD-10-CM | POA: Insufficient documentation

## 2014-12-12 DIAGNOSIS — N3941 Urge incontinence: Secondary | ICD-10-CM | POA: Insufficient documentation

## 2014-12-12 DIAGNOSIS — K219 Gastro-esophageal reflux disease without esophagitis: Secondary | ICD-10-CM | POA: Insufficient documentation

## 2014-12-12 DIAGNOSIS — Z79899 Other long term (current) drug therapy: Secondary | ICD-10-CM | POA: Insufficient documentation

## 2014-12-12 DIAGNOSIS — N3011 Interstitial cystitis (chronic) with hematuria: Secondary | ICD-10-CM | POA: Insufficient documentation

## 2014-12-12 DIAGNOSIS — Z791 Long term (current) use of non-steroidal anti-inflammatories (NSAID): Secondary | ICD-10-CM | POA: Insufficient documentation

## 2014-12-12 HISTORY — PX: CYSTOSCOPY WITH RETROGRADE PYELOGRAM, URETEROSCOPY AND STENT PLACEMENT: SHX5789

## 2014-12-12 LAB — GLUCOSE, CAPILLARY
GLUCOSE-CAPILLARY: 130 mg/dL — AB (ref 65–99)
GLUCOSE-CAPILLARY: 141 mg/dL — AB (ref 65–99)

## 2014-12-12 SURGERY — CYSTOURETEROSCOPY, WITH RETROGRADE PYELOGRAM AND STENT INSERTION
Anesthesia: General | Laterality: Bilateral

## 2014-12-12 MED ORDER — TRAMADOL HCL 50 MG PO TABS: 50.0000 mg | ORAL_TABLET | Freq: Four times a day (QID) | ORAL | Status: DC | PRN

## 2014-12-12 MED ORDER — FENTANYL CITRATE (PF) 100 MCG/2ML IJ SOLN
INTRAMUSCULAR | Status: AC
  Filled 2014-12-12: qty 2

## 2014-12-12 MED ORDER — OXYCODONE HCL 5 MG PO TABS: 5.0000 mg | ORAL_TABLET | ORAL | Status: DC | PRN

## 2014-12-12 MED ORDER — FENTANYL CITRATE (PF) 100 MCG/2ML IJ SOLN
INTRAMUSCULAR | Status: DC | PRN
  Administered 2014-12-12 (×2): 50 ug via INTRAVENOUS

## 2014-12-12 MED ORDER — SODIUM CHLORIDE 0.9 % IV SOLN: 250.0000 mL | INTRAVENOUS | Status: DC | PRN

## 2014-12-12 MED ORDER — ONDANSETRON HCL 4 MG/2ML IJ SOLN
INTRAMUSCULAR | Status: DC | PRN
  Administered 2014-12-12: 4 mg via INTRAVENOUS

## 2014-12-12 MED ORDER — ACETAMINOPHEN 650 MG RE SUPP
650.0000 mg | RECTAL | Status: DC | PRN
  Filled 2014-12-12: qty 1

## 2014-12-12 MED ORDER — MEPERIDINE HCL 50 MG/ML IJ SOLN: 6.2500 mg | INTRAMUSCULAR | Status: DC | PRN

## 2014-12-12 MED ORDER — LACTATED RINGERS IV SOLN
INTRAVENOUS | Status: DC
  Administered 2014-12-12: 09:00:00 via INTRAVENOUS
  Administered 2014-12-12: 1000 mL via INTRAVENOUS

## 2014-12-12 MED ORDER — PROPOFOL 10 MG/ML IV BOLUS
INTRAVENOUS | Status: AC
  Filled 2014-12-12: qty 20

## 2014-12-12 MED ORDER — SUCCINYLCHOLINE CHLORIDE 20 MG/ML IJ SOLN
INTRAMUSCULAR | Status: DC | PRN
  Administered 2014-12-12: 80 mg via INTRAVENOUS

## 2014-12-12 MED ORDER — FENTANYL CITRATE (PF) 100 MCG/2ML IJ SOLN: 25.0000 ug | INTRAMUSCULAR | Status: DC | PRN

## 2014-12-12 MED ORDER — IOHEXOL 300 MG/ML  SOLN
INTRAMUSCULAR | Status: DC | PRN
  Administered 2014-12-12: 10 mL

## 2014-12-12 MED ORDER — PHENAZOPYRIDINE HCL 100 MG PO TABS: 100.0000 mg | ORAL_TABLET | Freq: Three times a day (TID) | ORAL | Status: DC | PRN

## 2014-12-12 MED ORDER — ACETAMINOPHEN 325 MG PO TABS: 650.0000 mg | ORAL_TABLET | ORAL | Status: DC | PRN

## 2014-12-12 MED ORDER — PROPOFOL 10 MG/ML IV BOLUS
INTRAVENOUS | Status: DC | PRN
  Administered 2014-12-12: 100 mg via INTRAVENOUS

## 2014-12-12 MED ORDER — LIDOCAINE HCL (CARDIAC) 20 MG/ML IV SOLN
INTRAVENOUS | Status: DC | PRN
  Administered 2014-12-12: 80 mg via INTRAVENOUS

## 2014-12-12 MED ORDER — PROMETHAZINE HCL 25 MG/ML IJ SOLN
6.2500 mg | INTRAMUSCULAR | Status: DC | PRN
Start: 2014-12-12 — End: 2014-12-12

## 2014-12-12 MED ORDER — SODIUM CHLORIDE 0.9 % IJ SOLN: 3.0000 mL | INTRAMUSCULAR | Status: DC | PRN

## 2014-12-12 MED ORDER — STERILE WATER FOR IRRIGATION IR SOLN
Status: DC | PRN
  Administered 2014-12-12: 3000 mL

## 2014-12-12 MED ORDER — SODIUM CHLORIDE 0.9 % IJ SOLN: 3.0000 mL | Freq: Two times a day (BID) | INTRAMUSCULAR | Status: DC

## 2014-12-12 SURGICAL SUPPLY — 11 items
BAG URO CATCHER STRL LF (DRAPE) ×3 IMPLANT
CATH URET 5FR 28IN OPEN ENDED (CATHETERS) ×3 IMPLANT
CLOTH BEACON ORANGE TIMEOUT ST (SAFETY) ×3 IMPLANT
GLOVE SURG SS PI 8.0 STRL IVOR (GLOVE) ×3 IMPLANT
GOWN STRL REUS W/TWL XL LVL3 (GOWN DISPOSABLE) ×3 IMPLANT
GUIDEWIRE STR DUAL SENSOR (WIRE) ×3 IMPLANT
MANIFOLD NEPTUNE II (INSTRUMENTS) ×3 IMPLANT
PACK CYSTO (CUSTOM PROCEDURE TRAY) ×3 IMPLANT
SHEATH ACCESS URETERAL 38CM (SHEATH) ×3 IMPLANT
TUBING CONNECTING 10 (TUBING) ×2 IMPLANT
TUBING CONNECTING 10' (TUBING) ×1

## 2014-12-12 NOTE — Brief Op Note (Signed)
12/12/2014  9:56 AM  PATIENT:  Kurt Navarro  81 y.o. male  PRE-OPERATIVE DIAGNOSIS:  BLADDER LESIONS, Microhematuria  POST-OPERATIVE DIAGNOSIS:  BLADDER LESIONS with IC and HUNNER's ULCERS  PROCEDURE:  Procedure(s): CYSTO WITH BILATERAL RETROGRADE/BIOPSY WITH FULGURATION/HYDRODISTENSION OF BLADDER (Bilateral)  SURGEON:  Surgeon(s) and Role:    * Bjorn Pippin, MD - Primary  PHYSICIAN ASSISTANT:   ASSISTANTS: none   ANESTHESIA:   general  EBL:     BLOOD ADMINISTERED:none  DRAINS: none   LOCAL MEDICATIONS USED:  NONE  SPECIMEN:  Source of Specimen:  Bladder biopsies from posterior wall and dome  DISPOSITION OF SPECIMEN:  PATHOLOGY  COUNTS:  YES  TOURNIQUET:  * No tourniquets in log *  DICTATION: .Other Dictation: Dictation Number [id number]  PLAN OF CARE: Discharge to home after PACU  PATIENT DISPOSITION:  PACU - hemodynamically stable.   Delay start of Pharmacological VTE agent (>24hrs) due to surgical blood loss or risk of bleeding: not applicable

## 2014-12-12 NOTE — Interval H&P Note (Signed)
History and Physical Interval Note:  12/12/2014 9:04 AM  Kurt Navarro  has presented today for surgery, with the diagnosis of BLADER LESIONS  The various methods of treatment have been discussed with the patient and family. After consideration of risks, benefits and other options for treatment, the patient has consented to  Procedure(s): CYSTO WITH BILATERAL RETROGRADE/BIOPSY WITH FULGURATION/HYDRODISTENSION OF BLADDER (Bilateral) as a surgical intervention .  The patient's history has been reviewed, patient examined, no change in status, stable for surgery.  I have reviewed the patient's chart and labs.  Questions were answered to the patient's satisfaction.     Kurt Navarro

## 2014-12-12 NOTE — Anesthesia Procedure Notes (Signed)
Procedure Name: Intubation Date/Time: 12/12/2014 9:18 AM Performed by: [place], Ludivina Guymon C Pre-anesthesia Checklist: Patient identified, Suction available, Emergency Drugs available, Patient being monitored and Timeout performed Patient Re-evaluated:Patient Re-evaluated prior to inductionOxygen Delivery Method: Circle system utilized Preoxygenation: Pre-oxygenation with 100% oxygen Intubation Type: IV induction, Rapid sequence and Cricoid Pressure applied Laryngoscope Size: Mac and 3 Grade View: Grade I Tube type: Oral Tube size: 7.0 mm Number of attempts: 1 Airway Equipment and Method: Stylet Placement Confirmation: ETT inserted through vocal cords under direct vision,  CO2 detector,  positive ETCO2 and breath sounds checked- equal and bilateral Secured at: 20 cm Tube secured with: Tape Dental Injury: Teeth and Oropharynx as per pre-operative assessment

## 2014-12-12 NOTE — Transfer of Care (Signed)
Immediate Anesthesia Transfer of Care Note  Patient: Kurt Navarro  Procedure(s) Performed: Procedure(s): CYSTO WITH BILATERAL RETROGRADE/BIOPSY WITH FULGURATION/HYDRODISTENSION OF BLADDER (Bilateral)  Patient Location: PACU  Anesthesia Type:General  Level of Consciousness: awake and alert   Airway & Oxygen Therapy: Patient Spontanous Breathing and Patient connected to face mask oxygen  Post-op Assessment: Report given to RN and Post -op Vital signs reviewed and stable  Post vital signs: Reviewed and stable  Last Vitals:  Filed Vitals:   12/12/14 0733  BP: 155/71  Pulse: 89  Temp: 36.7 C  Resp: 16    Complications: No apparent anesthesia complications

## 2014-12-12 NOTE — Anesthesia Preprocedure Evaluation (Addendum)
Anesthesia Evaluation  Patient identified by MRN, date of birth, ID band Patient awake    Reviewed: Allergy & Precautions, NPO status , Patient's Chart, lab work & pertinent test results  History of Anesthesia Complications Negative for: history of anesthetic complications  Airway Mallampati: I  TM Distance: >3 FB Neck ROM: Full    Dental  (+) Edentulous Upper, Edentulous Lower   Pulmonary former smoker (quit 1978),  breath sounds clear to auscultation        Cardiovascular hypertension, Pt. on medications and Pt. on home beta blockers - angina+ CAD (medical management) Rhythm:Regular Rate:Normal     Neuro/Psych negative neurological ROS     GI/Hepatic Neg liver ROS, GERD-  Poorly Controlled,  Endo/Other  diabetes (glu 130), Oral Hypoglycemic Agents  Renal/GU negative Renal ROS     Musculoskeletal   Abdominal   Peds  Hematology negative hematology ROS (+)   Anesthesia Other Findings   Reproductive/Obstetrics                           Anesthesia Physical Anesthesia Plan  ASA: III  Anesthesia Plan: General   Post-op Pain Management:    Induction: Intravenous  Airway Management Planned: Oral ETT  Additional Equipment:   Intra-op Plan:   Post-operative Plan:   Informed Consent: I have reviewed the patients History and Physical, chart, labs and discussed the procedure including the risks, benefits and alternatives for the proposed anesthesia with the patient or authorized representative who has indicated his/her understanding and acceptance.   Dental advisory given  Plan Discussed with: CRNA and Surgeon  Anesthesia Plan Comments: (Plan routine monitors, GETA (poorly controlled GERD))        Anesthesia Quick Evaluation

## 2014-12-12 NOTE — Discharge Instructions (Signed)
CYSTOSCOPY HOME CARE INSTRUCTIONS  Activity: Rest for the remainder of the day.  Do not drive or operate equipment today.  You may resume normal activities in one to two days as instructed by your physician.   Meals: Drink plenty of liquids and eat light foods such as gelatin or soup this evening.  You may return to a normal meal plan tomorrow.  Return to Work: You may return to work in one to two days or as instructed by your physician.  Special Instructions / Symptoms: Call your physician if any of these symptoms occur:   -persistent or heavy bleeding  -bleeding which continues after first few urination  -large blood clots that are difficult to pass  -urine stream diminishes or stops completely  -fever equal to or higher than 101 degrees Farenheit.  -cloudy urine with a strong, foul odor  -severe pain  Females should always wipe from front to back after elimination.  You may feel some burning pain when you urinate.  This should disappear with time.  Applying moist heat to the lower abdomen or a hot tub bath may help relieve the pain. \  You can resume your Aspirin in 1 week.   Patient Signature:  ________________________________________________________  Nurse's Signature:  ________________________________________________________

## 2014-12-12 NOTE — Anesthesia Postprocedure Evaluation (Signed)
  Anesthesia Post-op Note  Patient: Kurt Navarro  Procedure(s) Performed: Procedure(s): CYSTO WITH BILATERAL RETROGRADE/BIOPSY WITH FULGURATION/HYDRODISTENSION OF BLADDER (Bilateral)  Patient Location: PACU  Anesthesia Type:General  Level of Consciousness: awake, alert , oriented and patient cooperative  Airway and Oxygen Therapy: Patient Spontanous Breathing and Patient connected to nasal cannula oxygen  Post-op Pain: none  Post-op Assessment: Post-op Vital signs reviewed, Patient's Cardiovascular Status Stable, Respiratory Function Stable, Patent Airway, No signs of Nausea or vomiting and Pain level controlled              Post-op Vital Signs: Reviewed and stable  Last Vitals:  Filed Vitals:   12/12/14 1143  BP: 144/77  Pulse: 76  Temp:   Resp: 16    Complications: No apparent anesthesia complications

## 2014-12-13 ENCOUNTER — Encounter (HOSPITAL_COMMUNITY): Payer: Self-pay | Admitting: Urology

## 2014-12-13 NOTE — Op Note (Signed)
NAMEAVRON, MAYO                ACCOUNT NO.:  [id number]  MEDICAL RECORD NO.:  [id number]  LOCATION:  [facility]                         FACILITY:  [facility]  PHYSICIAN:  Excell Seltzer. Annabell Howells, M.D.    DATE OF BIRTH:  03/18/1927  DATE OF PROCEDURE:  12/12/2014 DATE OF DISCHARGE:  12/12/2014                              OPERATIVE REPORT   PROCEDURE: 1. Cystoscopy with bilateral retrograde pyelograms and interpretation. 2. Hydrodistention of the bladder. 3. Bladder biopsy and fulguration.  PREOPERATIVE DIAGNOSIS:  Microhematuria with bladder lesions.  POSTOPERATIVE DIAGNOSIS:  Microhematuria with bladder lesions with interstitial cystitis and Hunner's ulcers.  SURGEON:  Excell Seltzer. Annabell Howells, M.D.  ANESTHESIA:  General.  SPECIMEN:  Bladder biopsy from dome and posterior wall.  DRAINS:  None.  BLOOD LOSS:  None.  COMPLICATIONS:  None.  INDICATIONS:  Mr. Debski is an 81 year old white male who has a history of overactive bladder with urge incontinence.  He also had microhematuria and pyuria.  Cystoscopy in the office revealed lesions on the dome of the bladder which were worrisome for carcinoma in situ versus inflammatory changes.  Urine culture and cytology were negative.  It was felt he needed to undergo cystoscopy, retrograde pyelography with hydrodistention of the bladder, and bladder biopsies.  FINDINGS AND PROCEDURE:  He was taken to the operating room where general anesthetic was induced.  He was given 320 mg of gentamicin for antibiotic prophylaxis.  He was placed in lithotomy position and fitted with PAS hose.  His perineum and genitalia were prepped with Betadine solution.  He was draped in usual sterile fashion.  Cystoscopy was performed using a 23-French scope and 30-degree lens. Examination revealed a normal urethra.  The external sphincter was intact.  The prostatic urethra was short without visible obstruction. Examination of bladder revealed ureteral orifices in the normal  anatomic position.  The bladder wall had mild to moderate trabeculation.  No tumors or stones were identified, but there were 2 stellate appearing scars, one on the posterior wall to the left and one on the dome to the right that had some surrounding erythema.  These findings were most consistent with inflammatory changes.  After completion of thorough cystoscopy, retrograde pyelography was performed using Omnipaque and a 5-French open-end catheter.  Right retrograde pyelogram revealed a normal ureter and intrarenal collecting system.  Left retrograde pyelogram revealed a normal ureter and intrarenal collecting system.  After completion of the retrograde pyelogram, the bladder was dilated under approximately 80 cm of water pressure to capacity and then drained.  His capacity under anesthesia was 650 mL.  It had been 300 mL on urodynamics.  Once hydrodistention had been performed, a cup biopsy forceps was used to obtain biopsies from the abnormal mucosa, adjacent to the stellate scars on the posterior wall and dome.  Once biopsies were obtained, the erythematous areas were generously fulgurated with a Bugbee electrode.  Once hemostasis was achieved.  The bladder was drained.  The cystoscope was removed.  The patient was taken down from lithotomy position.  His anesthetic was reversed, and he was moved to the recovery room in stable condition.  There were no complications.     Excell Seltzer.  Annabell Howells, M.D.     JJW/MEDQ  D:  12/12/2014  T:  12/13/2014  Job:  [id number]

## 2014-12-25 NOTE — ED Notes (Signed)
Pt, being sent by Urgent Care, c/o abdominal pain and constipation x 4 days and emesis starting this morning.  Hx of DM and HTN.

## 2014-12-31 ENCOUNTER — Other Ambulatory Visit: Payer: Self-pay | Admitting: Internal Medicine

## 2014-12-31 DIAGNOSIS — R198 Other specified symptoms and signs involving the digestive system and abdomen: Principal | ICD-10-CM

## 2014-12-31 DIAGNOSIS — IMO0002 Reserved for concepts with insufficient information to code with codable children: Secondary | ICD-10-CM

## 2015-01-03 ENCOUNTER — Ambulatory Visit
Admission: RE | Admit: 2015-01-03 | Discharge: 2015-01-03 | Disposition: A | Discharge status: Home / Self Care | Payer: Medicare Other | Source: Ambulatory Visit | Attending: Internal Medicine | Admitting: Internal Medicine

## 2015-01-03 DIAGNOSIS — IMO0002 Reserved for concepts with insufficient information to code with codable children: Secondary | ICD-10-CM

## 2015-01-03 DIAGNOSIS — R198 Other specified symptoms and signs involving the digestive system and abdomen: Principal | ICD-10-CM

## 2015-01-03 MED ORDER — IOPAMIDOL (ISOVUE-300) INJECTION 61%
100.0000 mL | Freq: Once | INTRAVENOUS | Status: AC | PRN
  Administered 2015-01-03: 100 mL via INTRAVENOUS

## 2015-01-09 ENCOUNTER — Emergency Department (HOSPITAL_COMMUNITY)
Admission: EM | Admit: 2015-01-09 | Discharge: 2015-01-09 | Disposition: A | Discharge status: Home / Self Care | Payer: Medicare Other | Attending: Emergency Medicine | Admitting: Emergency Medicine

## 2015-01-09 ENCOUNTER — Encounter (HOSPITAL_COMMUNITY): Payer: Self-pay

## 2015-01-09 DIAGNOSIS — Z043 Encounter for examination and observation following other accident: Secondary | ICD-10-CM | POA: Diagnosis present

## 2015-01-09 DIAGNOSIS — K219 Gastro-esophageal reflux disease without esophagitis: Secondary | ICD-10-CM | POA: Diagnosis not present

## 2015-01-09 DIAGNOSIS — Z87891 Personal history of nicotine dependence: Secondary | ICD-10-CM | POA: Diagnosis not present

## 2015-01-09 DIAGNOSIS — I10 Essential (primary) hypertension: Secondary | ICD-10-CM | POA: Insufficient documentation

## 2015-01-09 DIAGNOSIS — W1839XA Other fall on same level, initial encounter: Secondary | ICD-10-CM | POA: Insufficient documentation

## 2015-01-09 DIAGNOSIS — E119 Type 2 diabetes mellitus without complications: Secondary | ICD-10-CM | POA: Insufficient documentation

## 2015-01-09 DIAGNOSIS — I2510 Atherosclerotic heart disease of native coronary artery without angina pectoris: Secondary | ICD-10-CM | POA: Diagnosis not present

## 2015-01-09 DIAGNOSIS — E780 Pure hypercholesterolemia: Secondary | ICD-10-CM | POA: Diagnosis not present

## 2015-01-09 DIAGNOSIS — S20212A Contusion of left front wall of thorax, initial encounter: Secondary | ICD-10-CM | POA: Insufficient documentation

## 2015-01-09 DIAGNOSIS — N400 Enlarged prostate without lower urinary tract symptoms: Secondary | ICD-10-CM | POA: Diagnosis not present

## 2015-01-09 DIAGNOSIS — S301XXA Contusion of abdominal wall, initial encounter: Secondary | ICD-10-CM | POA: Insufficient documentation

## 2015-01-09 DIAGNOSIS — Z7982 Long term (current) use of aspirin: Secondary | ICD-10-CM | POA: Insufficient documentation

## 2015-01-09 DIAGNOSIS — W19XXXA Unspecified fall, initial encounter: Secondary | ICD-10-CM

## 2015-01-09 DIAGNOSIS — R011 Cardiac murmur, unspecified: Secondary | ICD-10-CM | POA: Diagnosis not present

## 2015-01-09 DIAGNOSIS — Y93E1 Activity, personal bathing and showering: Secondary | ICD-10-CM | POA: Insufficient documentation

## 2015-01-09 DIAGNOSIS — Z79899 Other long term (current) drug therapy: Secondary | ICD-10-CM | POA: Insufficient documentation

## 2015-01-09 DIAGNOSIS — H409 Unspecified glaucoma: Secondary | ICD-10-CM | POA: Insufficient documentation

## 2015-01-09 DIAGNOSIS — Y998 Other external cause status: Secondary | ICD-10-CM | POA: Diagnosis not present

## 2015-01-09 DIAGNOSIS — T148XXA Other injury of unspecified body region, initial encounter: Secondary | ICD-10-CM

## 2015-01-09 DIAGNOSIS — Y92091 Bathroom in other non-institutional residence as the place of occurrence of the external cause: Secondary | ICD-10-CM | POA: Insufficient documentation

## 2015-01-09 NOTE — ED Notes (Signed)
Pt states he was in the shower at home and fell landing on left side.  Pt did not strike head or have LOC.  Family called 911 to get help getting pt up.  Family able to transport pt here.  C/O pain in left back and side.  Able to stand post event.  Pt takes ASA daily.

## 2015-01-09 NOTE — ED Provider Notes (Signed)
CSN: [id number]     Arrival date & time 01/09/15  1402 History   First MD Initiated Contact with Patient 01/09/15 1549     Chief Complaint  Patient presents with  . Fall  . Back Pain      HPI Patient presents to the emergency department after being seen and evaluated in the urology clinic today for an unrelated complaint.  They noted that he is bruised all over.  They sent him to the ER for evaluation.  Patient reports that he fell 5 days ago in the shower.  He's been walking and related without any difficulty since then.  He denies pain anywhere.  He is not on anticoagulants.  He states that he feels fine and is unsure why he was sent to the emergency department.  His wife tells me that he was sent here for possible need for x-rays.  Patient is without complaints at this time.     Past Medical History  Diagnosis Date  . Diabetes mellitus without complication   . Hypertension   . Hypercholesteremia   . Heart murmur   . Frequent urination   . BPH (benign prostatic hyperplasia)   . GERD (gastroesophageal reflux disease)   . Coronary artery disease     medical management with aspirin  . Glaucoma    Past Surgical History  Procedure Laterality Date  . Leg surgery  5 years ago    Rod in right leg  . Back surgery  years ago    lower  . Hernia repair  years ago  . Cystoscopy with retrograde pyelogram, ureteroscopy and stent placement Bilateral 12/12/2014    Procedure: CYSTO WITH BILATERAL RETROGRADE/BIOPSY WITH FULGURATION/HYDRODISTENSION OF BLADDER;  Surgeon: Bjorn Pippin, MD;  Location: [facility];  Service: Urology;  Laterality: Bilateral;   History reviewed. No pertinent family history. History  Substance Use Topics  . Smoking status: Former Smoker -- 1.00 packs/day for 30 years    Types: Cigarettes    Quit date: 06/28/1976  . Smokeless tobacco: Never Used  . Alcohol Use: No    Review of Systems  All other systems reviewed and are negative.     Allergies  Review of  patient's allergies indicates no known allergies.  Home Medications   Prior to Admission medications   Medication Sig Start Date End Date Taking? Authorizing Provider  aspirin 325 MG tablet Take 325 mg by mouth daily.   Yes Historical Provider, MD  ferrous sulfate 325 (65 FE) MG EC tablet Take 325 mg by mouth 2 (two) times daily.   Yes Historical Provider, MD  finasteride (PROSCAR) 5 MG tablet Take 5 mg by mouth daily.   Yes Historical Provider, MD  ibandronate (BONIVA) 150 MG tablet Take 150 mg by mouth every 30 (thirty) days. 11/12/14  Yes Historical Provider, MD  levothyroxine (SYNTHROID, LEVOTHROID) 50 MCG tablet Take 50 mcg by mouth daily before breakfast.   Yes Historical Provider, MD  losartan (COZAAR) 100 MG tablet Take 100 mg by mouth daily.   Yes Historical Provider, MD  metFORMIN (GLUCOPHAGE) 500 MG tablet Take 500 mg by mouth 4 (four) times daily -  with meals and at bedtime.   Yes Historical Provider, MD  Multiple Vitamin (MULTIVITAMIN WITH MINERALS) TABS tablet Take 1 tablet by mouth daily.   Yes Historical Provider, MD  omeprazole (PRILOSEC) 20 MG capsule Take 40 mg by mouth 2 (two) times daily.    Yes Historical Provider, MD  pravastatin (PRAVACHOL) 20 MG tablet Take 20  mg by mouth every evening.    Yes Historical Provider, MD  tamsulosin (FLOMAX) 0.4 MG CAPS capsule Take 0.4 mg by mouth daily.   Yes Historical Provider, MD  Travoprost, BAK Free, (TRAVATAN) 0.004 % SOLN ophthalmic solution Place 1 drop into both eyes at bedtime.   Yes Historical Provider, MD  phenazopyridine (PYRIDIUM) 100 MG tablet Take 1 tablet (100 mg total) by mouth 3 (three) times daily as needed for pain. Patient not taking: Reported on 01/09/2015 12/12/14   Bjorn PippinJohn Wrenn, MD  traMADol (ULTRAM) 50 MG tablet Take 1 tablet (50 mg total) by mouth every 6 (six) hours as needed. Patient not taking: Reported on 01/09/2015 12/12/14   Bjorn PippinJohn Wrenn, MD    Physical Exam  Constitutional: He is oriented to person, place, and  time. He appears well-developed and well-nourished.  HENT:  Head: Normocephalic and atraumatic.  Eyes: EOM are normal.  Neck: Normal range of motion.  Cardiovascular: Normal rate, regular rhythm, normal heart sounds and intact distal pulses.   Pulmonary/Chest: Effort normal and breath sounds normal. No respiratory distress.  No chest wall tenderness  Abdominal: Soft. He exhibits no distension. There is no tenderness.  Musculoskeletal: Normal range of motion.  Neurological: He is alert and oriented to person, place, and time.  Skin: Skin is warm and dry.  Bruising or crossed abdominal wall and anterior and anterolateral left chest.  Psychiatric: He has a normal mood and affect. Judgment normal.  Nursing note and vitals reviewed.   ED Course  Procedures (including critical care time) Labs Review Labs Reviewed - No data to display  Imaging Review No results found.   EKG Interpretation None      MDM   Final diagnoses:  None    Patient is without focal tenderness at this time.  He is 5 days out from his fall.  He did not injure his head.  He has no pain in his chest.   Azalia BilisKevin Doniven Vanpatten, MD 01/09/15 [phone]

## 2015-01-09 NOTE — Progress Notes (Addendum)
ED CM noted pt without pcp listed  Pt informed Cm he sees Dr Thea SilversmithMackenzie and has seen Dr Samuel GermanyGage in past EPIC updated CM assisted pt to swish his mouth out, elevate hob and provided emesis bag Male family member arrived prior to Cm leaving his room  Dr Patria Maneampos arrived prior to Cm leaving pt room  Pt has walker at home No further DME needed  Consulted with Dr Patria Maneampos about Home health No needs identified at this time

## 2015-02-05 ENCOUNTER — Emergency Department (HOSPITAL_COMMUNITY): Payer: Medicare Other

## 2015-02-05 ENCOUNTER — Encounter (HOSPITAL_COMMUNITY): Payer: Self-pay | Admitting: Emergency Medicine

## 2015-02-05 ENCOUNTER — Emergency Department (HOSPITAL_COMMUNITY)
Admission: EM | Admit: 2015-02-05 | Discharge: 2015-02-05 | Disposition: A | Discharge status: Home / Self Care | Payer: Medicare Other | Attending: Emergency Medicine | Admitting: Emergency Medicine

## 2015-02-05 DIAGNOSIS — W19XXXA Unspecified fall, initial encounter: Secondary | ICD-10-CM

## 2015-02-05 DIAGNOSIS — R011 Cardiac murmur, unspecified: Secondary | ICD-10-CM | POA: Insufficient documentation

## 2015-02-05 DIAGNOSIS — W1839XA Other fall on same level, initial encounter: Secondary | ICD-10-CM | POA: Diagnosis not present

## 2015-02-05 DIAGNOSIS — Z7982 Long term (current) use of aspirin: Secondary | ICD-10-CM | POA: Insufficient documentation

## 2015-02-05 DIAGNOSIS — Z87438 Personal history of other diseases of male genital organs: Secondary | ICD-10-CM | POA: Insufficient documentation

## 2015-02-05 DIAGNOSIS — Z79899 Other long term (current) drug therapy: Secondary | ICD-10-CM | POA: Diagnosis not present

## 2015-02-05 DIAGNOSIS — E780 Pure hypercholesterolemia: Secondary | ICD-10-CM | POA: Insufficient documentation

## 2015-02-05 DIAGNOSIS — S29001A Unspecified injury of muscle and tendon of front wall of thorax, initial encounter: Secondary | ICD-10-CM | POA: Diagnosis not present

## 2015-02-05 DIAGNOSIS — H409 Unspecified glaucoma: Secondary | ICD-10-CM | POA: Insufficient documentation

## 2015-02-05 DIAGNOSIS — Z87891 Personal history of nicotine dependence: Secondary | ICD-10-CM | POA: Diagnosis not present

## 2015-02-05 DIAGNOSIS — I10 Essential (primary) hypertension: Secondary | ICD-10-CM | POA: Diagnosis not present

## 2015-02-05 DIAGNOSIS — K219 Gastro-esophageal reflux disease without esophagitis: Secondary | ICD-10-CM | POA: Insufficient documentation

## 2015-02-05 DIAGNOSIS — I2510 Atherosclerotic heart disease of native coronary artery without angina pectoris: Secondary | ICD-10-CM | POA: Diagnosis not present

## 2015-02-05 DIAGNOSIS — Y9389 Activity, other specified: Secondary | ICD-10-CM | POA: Insufficient documentation

## 2015-02-05 DIAGNOSIS — R911 Solitary pulmonary nodule: Secondary | ICD-10-CM | POA: Diagnosis not present

## 2015-02-05 DIAGNOSIS — S3992XA Unspecified injury of lower back, initial encounter: Secondary | ICD-10-CM | POA: Diagnosis present

## 2015-02-05 DIAGNOSIS — E119 Type 2 diabetes mellitus without complications: Secondary | ICD-10-CM | POA: Insufficient documentation

## 2015-02-05 DIAGNOSIS — Y998 Other external cause status: Secondary | ICD-10-CM | POA: Diagnosis not present

## 2015-02-05 DIAGNOSIS — Y929 Unspecified place or not applicable: Secondary | ICD-10-CM | POA: Diagnosis not present

## 2015-02-05 DIAGNOSIS — S22089A Unspecified fracture of T11-T12 vertebra, initial encounter for closed fracture: Secondary | ICD-10-CM | POA: Diagnosis not present

## 2015-02-05 DIAGNOSIS — R918 Other nonspecific abnormal finding of lung field: Secondary | ICD-10-CM

## 2015-02-05 LAB — CBC WITH DIFFERENTIAL/PLATELET
BASOS PCT: 0 % (ref 0–1)
Basophils Absolute: 0 10*3/uL (ref 0.0–0.1)
EOS ABS: 0 10*3/uL (ref 0.0–0.7)
Eosinophils Relative: 0 % (ref 0–5)
HCT: 33.6 % — ABNORMAL LOW (ref 39.0–52.0)
Hemoglobin: 11.6 g/dL — ABNORMAL LOW (ref 13.0–17.0)
Lymphocytes Relative: 3 % — ABNORMAL LOW (ref 12–46)
Lymphs Abs: 0.4 10*3/uL — ABNORMAL LOW (ref 0.7–4.0)
MCH: 31 pg (ref 26.0–34.0)
MCHC: 34.5 g/dL (ref 30.0–36.0)
MCV: 89.8 fL (ref 78.0–100.0)
Monocytes Absolute: 0.6 10*3/uL (ref 0.1–1.0)
Monocytes Relative: 4 % (ref 3–12)
NEUTROS PCT: 93 % — AB (ref 43–77)
Neutro Abs: 11.9 10*3/uL — ABNORMAL HIGH (ref 1.7–7.7)
Platelets: 269 10*3/uL (ref 150–400)
RBC: 3.74 MIL/uL — ABNORMAL LOW (ref 4.22–5.81)
RDW: 14.5 % (ref 11.5–15.5)
WBC: 12.9 10*3/uL — ABNORMAL HIGH (ref 4.0–10.5)

## 2015-02-05 LAB — BASIC METABOLIC PANEL
ANION GAP: 9 (ref 5–15)
BUN: 18 mg/dL (ref 6–20)
CHLORIDE: 95 mmol/L — AB (ref 101–111)
CO2: 26 mmol/L (ref 22–32)
CREATININE: 0.78 mg/dL (ref 0.61–1.24)
Calcium: 9.4 mg/dL (ref 8.9–10.3)
GFR calc non Af Amer: >60 mL/min (ref >60)
Glucose, Bld: 165 mg/dL — ABNORMAL HIGH (ref 65–99)
Potassium: 4.2 mmol/L (ref 3.5–5.1)
Sodium: 130 mmol/L — ABNORMAL LOW (ref 135–145)

## 2015-02-05 MED ORDER — HYDROCODONE-ACETAMINOPHEN 5-325 MG PO TABS
2.0000 | ORAL_TABLET | Freq: Once | ORAL | Status: AC
  Administered 2015-02-05: 2 via ORAL
  Filled 2015-02-05: qty 2

## 2015-02-05 MED ORDER — HYDROCODONE-ACETAMINOPHEN 5-325 MG PO TABS: 1.0000 | ORAL_TABLET | Freq: Four times a day (QID) | ORAL | Status: DC | PRN

## 2015-02-05 MED ORDER — IOHEXOL 300 MG/ML  SOLN
80.0000 mL | Freq: Once | INTRAMUSCULAR | Status: AC | PRN
  Administered 2015-02-05: 100 mL via INTRAVENOUS

## 2015-02-05 MED ORDER — FENTANYL CITRATE (PF) 100 MCG/2ML IJ SOLN
50.0000 ug | Freq: Once | INTRAMUSCULAR | Status: AC
  Administered 2015-02-05: 50 ug via INTRAVENOUS
  Filled 2015-02-05: qty 2

## 2015-02-05 NOTE — ED Notes (Signed)
Patient being transported to CT

## 2015-02-05 NOTE — ED Notes (Signed)
Pt presents with family by POV for a fall that occurred 2 hrs PTA; pt reports bending over to pick something up when he lost balance and fell forward hitting head; pt also c/o middle back pain, right shoulder pain, and has several skin tears on bilat arms; pt CAOx4 at this time and VSS

## 2015-02-05 NOTE — Discharge Instructions (Signed)
Your CT scan today shows a few lung nodules/masses that need to be worked up in the next 2-3 weeks.  More testing needs to be done before we can determine exactly what they are.  Your CT also shows a possible fracture of your T11 vertebra, which does not require any treatment, but may be painful as it is healing.  The CT also showed 2 old rib fractures, which are not thought to be attributed to your fall today.  Please return if you have cough, fever, or worsening pain.  Otherwise, please follow-up with your primary care provider.  Fall Prevention and Home Safety Falls cause injuries and can affect all age groups. It is possible to prevent falls.  HOW TO PREVENT FALLS  Wear shoes with rubber soles that do not have an opening for your toes.  Keep the inside and outside of your house well lit.  Use night lights throughout your home.  Remove clutter from floors.  Clean up floor spills.  Remove throw rugs or fasten them to the floor with carpet tape.  Do not place electrical cords across pathways.  Put grab bars by your tub, shower, and toilet. Do not use towel bars as grab bars.  Put handrails on both sides of the stairway. Fix loose handrails.  Do not climb on stools or stepladders, if possible.  Do not wax your floors.  Repair uneven or unsafe sidewalks, walkways, or stairs.  Keep items you use a lot within reach.  Be aware of pets.  Keep emergency numbers next to the telephone.  Put smoke detectors in your home and near bedrooms. Ask your doctor what other things you can do to prevent falls. Document Released: 04/10/2009 Document Revised: 12/14/2011 Document Reviewed: 09/14/2011 [facility] Patient Information 2015 [place], [place]. This information is not intended to replace advice given to you by your health care provider. Make sure you discuss any questions you have with your health care provider.  Back, Compression Fracture A compression fracture happens when a force is  put upon the length of your spine. Slipping and falling on your bottom are examples of such a force. When this happens, sometimes the force is great enough to compress the building blocks (vertebral bodies) of your spine. Although this causes a lot of pain, this can usually be treated at home, unless your caregiver feels hospitalization is needed for pain control. Your backbone (spinal column) is made up of 24 main vertebral bodies in addition to the sacrum and coccyx (see illustration). These are held together by tough fibrous tissues (ligaments) and by support of your muscles. Nerve roots pass through the openings between the vertebrae. A sudden wrenching move, injury, or a fall may cause a compression fracture of one of the vertebral bodies. This may result in back pain or spread of pain into the belly (abdomen), the buttocks, and down the leg into the foot. Pain may also be created by muscle spasm alone. Large studies have been undertaken to determine the best possible course of action to help your back following injury and also to prevent future problems. The recommendations are as follows. FOLLOWING A COMPRESSION FRACTURE: Do the following only if advised by your caregiver.   If a back brace has been suggested or provided, wear it as directed.  Do not stop wearing the back brace unless instructed by your caregiver.  When allowed to return to regular activities, avoid a sedentary lifestyle. Actively exercise. Sporadic weekend binges of tennis, racquetball, or waterskiing may actually  aggravate or create problems, especially if you are not in condition for that activity.  Avoid sports requiring sudden body movements until you are in condition for them. Swimming and walking are safer activities.  Maintain good posture.  Avoid obesity.  If not already done, you should have a DEXA scan. Based on the results, be treated for osteoporosis. FOLLOWING ACUTE (SUDDEN) INJURY:  Only take  over-the-counter or prescription medicines for pain, discomfort, or fever as directed by your caregiver.  Use bed rest for only the most extreme acute episode. Prolonged bed rest may aggravate your condition. Ice used for acute conditions is effective. Use a large plastic bag filled with ice. Wrap it in a towel. This also provides excellent pain relief. This may be continuous. Or use it for 30 minutes every 2 hours during acute phase, then as needed. Heat for 30 minutes prior to activities is helpful.  As soon as the acute phase (the time when your back is too painful for you to do normal activities) is over, it is important to resume normal activities and work Arboriculturist. Back injuries can cause potentially marked changes in lifestyle. So it is important to attack these problems aggressively.  See your caregiver for continued problems. He or she can help or refer you for appropriate exercises, physical therapy, and work hardening if needed.  If you are given narcotic medications for your condition, for the next 24 hours do not:  Drive.  Operate machinery or power tools.  Sign legal documents.  Do not drink alcohol, or take sleeping pills or other medications that may interfere with treatment. If your caregiver has given you a follow-up appointment, it is very important to keep that appointment. Not keeping the appointment could result in a chronic or permanent injury, pain, and disability. If there is any problem keeping the appointment, you must call back to this facility for assistance.  SEEK IMMEDIATE MEDICAL CARE IF:  You develop numbness, tingling, weakness, or problems with the use of your arms or legs.  You develop severe back pain not relieved with medications.  You have changes in bowel or bladder control.  You have increasing pain in any areas of the body. Document Released: 06/14/2005 Document Revised: 10/29/2013 Document Reviewed: 01/17/2008 [facility] Patient  Information 2015 [place], [place]. This information is not intended to replace advice given to you by your health care provider. Make sure you discuss any questions you have with your health care provider.

## 2015-02-05 NOTE — ED Notes (Signed)
Pt given instructions regarding incentive spirometry

## 2015-02-05 NOTE — ED Provider Notes (Signed)
CSN: [id number]     Arrival date & time 02/05/15  0603 History   First MD Initiated Contact with Patient 02/05/15 [phone]     Chief Complaint  Patient presents with  . Fall     (Consider location/radiation/quality/duration/timing/severity/associated sxs/prior Treatment) HPI Comments: Patient presents to the emergency department with chief complaint of mechanical fall. Patient states that this morning he lost his balance while he was trying to pick up a towel off the floor. Patient states that he fell forward and landed on his right shoulder. He also hit his forehead. He did not pass out. He complains of pain with deep breathing. He has multiple skin tears on his upper extremities, there are no deep lacerations. Patient also complains of mild mid back pain. Movement makes his symptoms worse. He has taken a hydrocodone with moderate relief.  The history is provided by the patient. No language interpreter was used.    Past Medical History  Diagnosis Date  . Diabetes mellitus without complication   . Hypertension   . Hypercholesteremia   . Heart murmur   . Frequent urination   . BPH (benign prostatic hyperplasia)   . GERD (gastroesophageal reflux disease)   . Coronary artery disease     medical management with aspirin  . Glaucoma    Past Surgical History  Procedure Laterality Date  . Leg surgery  5 years ago    Rod in right leg  . Back surgery  years ago    lower  . Hernia repair  years ago  . Cystoscopy with retrograde pyelogram, ureteroscopy and stent placement Bilateral 12/12/2014    Procedure: CYSTO WITH BILATERAL RETROGRADE/BIOPSY WITH FULGURATION/HYDRODISTENSION OF BLADDER;  Surgeon: Bjorn Pippin, MD;  Location: [facility];  Service: Urology;  Laterality: Bilateral;   History reviewed. No pertinent family history. Social History  Substance Use Topics  . Smoking status: Former Smoker -- 1.00 packs/day for 30 years    Types: Cigarettes    Quit date: 06/28/1976  . Smokeless tobacco:  Never Used  . Alcohol Use: No    Review of Systems  Constitutional: Negative for fever and chills.  Respiratory: Negative for shortness of breath.   Cardiovascular: Positive for chest pain.  Gastrointestinal: Negative for nausea, vomiting, diarrhea and constipation.  Genitourinary: Negative for dysuria.  Musculoskeletal: Positive for back pain and arthralgias.  All other systems reviewed and are negative.     Allergies  Review of patient's allergies indicates no known allergies.  Home Medications   Prior to Admission medications   Medication Sig Start Date End Date Taking? Authorizing Provider  aspirin 325 MG tablet Take 325 mg by mouth daily.    Historical Provider, MD  ferrous sulfate 325 (65 FE) MG EC tablet Take 325 mg by mouth 2 (two) times daily.    Historical Provider, MD  finasteride (PROSCAR) 5 MG tablet Take 5 mg by mouth daily.    Historical Provider, MD  ibandronate (BONIVA) 150 MG tablet Take 150 mg by mouth every 30 (thirty) days. 11/12/14   Historical Provider, MD  levothyroxine (SYNTHROID, LEVOTHROID) 50 MCG tablet Take 50 mcg by mouth daily before breakfast.    Historical Provider, MD  losartan (COZAAR) 100 MG tablet Take 100 mg by mouth daily.    Historical Provider, MD  metFORMIN (GLUCOPHAGE) 500 MG tablet Take 500 mg by mouth 4 (four) times daily -  with meals and at bedtime.    Historical Provider, MD  Multiple Vitamin (MULTIVITAMIN WITH MINERALS) TABS tablet Take 1  tablet by mouth daily.    Historical Provider, MD  omeprazole (PRILOSEC) 20 MG capsule Take 40 mg by mouth 2 (two) times daily.     Historical Provider, MD  phenazopyridine (PYRIDIUM) 100 MG tablet Take 1 tablet (100 mg total) by mouth 3 (three) times daily as needed for pain. Patient not taking: Reported on 01/09/2015 12/12/14   Bjorn Pippin, MD  pravastatin (PRAVACHOL) 20 MG tablet Take 20 mg by mouth every evening.     Historical Provider, MD  tamsulosin (FLOMAX) 0.4 MG CAPS capsule Take 0.4 mg by  mouth daily.    Historical Provider, MD  traMADol (ULTRAM) 50 MG tablet Take 1 tablet (50 mg total) by mouth every 6 (six) hours as needed. Patient not taking: Reported on 01/09/2015 12/12/14   Bjorn Pippin, MD  Travoprost, BAK Free, (TRAVATAN) 0.004 % SOLN ophthalmic solution Place 1 drop into both eyes at bedtime.    Historical Provider, MD   BP 159/62 mmHg  Pulse 84  Temp(Src) 98.2 F (36.8 C) (Oral)  Resp 20  SpO2 96% Physical Exam  Constitutional: He is oriented to person, place, and time. He appears well-developed and well-nourished.  HENT:  Head: Normocephalic and atraumatic.  Eyes: Conjunctivae and EOM are normal. Pupils are equal, round, and reactive to light. Right eye exhibits no discharge. Left eye exhibits no discharge. No scleral icterus.  Neck: Normal range of motion. Neck supple. No JVD present.  Cardiovascular: Normal rate, regular rhythm and normal heart sounds.  Exam reveals no gallop and no friction rub.   No murmur heard. Pulmonary/Chest: Effort normal and breath sounds normal. No respiratory distress. He has no wheezes. He has no rales. He exhibits no tenderness.  CTAB  Right ribs ttp  Abdominal: Soft. He exhibits no distension and no mass. There is no tenderness. There is no rebound and no guarding.  No focal abdominal tenderness, no RLQ tenderness or pain at McBurney's point, no RUQ tenderness or Murphy's sign, no left-sided abdominal tenderness, no fluid wave, or signs of peritonitis   Musculoskeletal: Normal range of motion. He exhibits no edema or tenderness.  Lumbar spine mildly ttp, no bony abnormality or deformity  ROM and strength is 5/5 Ambulatory  Neurological: He is alert and oriented to person, place, and time.  Skin: Skin is warm and dry.  Multiple skin tears on bilateral elbows  Psychiatric: He has a normal mood and affect. His behavior is normal. Judgment and thought content normal.  Nursing note and vitals reviewed.   ED Course  Procedures  (including critical care time) Labs Review Labs Reviewed - No data to display  Imaging Review Dg Chest 2 View  02/05/2015   CLINICAL DATA:  Larey Seat. Week and soreness all over. Tenderness in the back and right shoulder area.  EXAM: CHEST  2 VIEW  COMPARISON:  04/28/2014  FINDINGS: There are old right rib fractures. Age-indeterminate fractures involving the left fifth and sixth ribs. Densities at the left lung base could represent atelectasis or scarring. Heart size is grossly normal with atherosclerotic calcifications at the aorta. Negative for a pneumothorax. Again noted is a compression fracture in the upper lumbar spine.  IMPRESSION: Low lung volumes bilaterally.  Bilateral rib fractures. Left rib fractures are age indeterminate and new from the prior examination.   Electronically Signed   By: Richarda Overlie M.D.   On: 02/05/2015 07:55   Dg Lumbar Spine Complete  02/05/2015   CLINICAL DATA:  Injury, fall in the bathroom  EXAM: LUMBAR  SPINE - COMPLETE 4+ VIEW  COMPARISON:  01/03/2015  FINDINGS: Five views of the lumbar spine submitted. There is diffuse osteopenia. Atherosclerotic calcifications of abdominal aorta and iliac arteries. Again noted compression deformity upper endplate of L1 vertebral body is stable from prior exam. There is disc space flattening with mild anterior spurring at T12-L1 and L1-L2 level. Mild anterior spurring upper endplate of L3 and L4 vertebral body. Minimal disc space flattening at L5-S1 level. There is mild lower lumbar levoscoliosis.  IMPRESSION: No acute fracture or subluxation. Mild lower lumbar levoscoliosis. Degenerative changes as described above. Stable compression fracture upper endplate of L1 vertebral body. Diffuse osteopenia.   Electronically Signed   By: Natasha Mead M.D.   On: 02/05/2015 08:05   Dg Shoulder Right  02/05/2015   CLINICAL DATA:  Larey Seat with weakness. Tenderness in the back and right shoulder area.  EXAM: RIGHT SHOULDER - 2+ VIEW  COMPARISON:  04/28/2014   FINDINGS: There are old right rib fractures. The right shoulder is located without a fracture. Right AC joint is intact. Evidence for spurring and degenerative changes at the right glenohumeral joint.  IMPRESSION: No acute bone abnormality in the right shoulder.   Electronically Signed   By: Richarda Overlie M.D.   On: 02/05/2015 08:00     EKG Interpretation None      MDM   Final diagnoses:  Fall  T11 vertebral fracture, closed, initial encounter  Multiple lung nodules on CT     Chest x-ray remarkable for for possible fractures. Patient see by and discussed with Dr. Clarene Duke, who recommend CT chest to rule out further fractures or small pneumo.  11:47 AM CT chest was remarkable for no acute rib fractures, but is noted to have a spiculated mass concerning for carcinoma. Patient will need to have a PET scan. I have informed the patient of these findings, and have encouraged him to seek appropriate follow-up with his primary care provider. There is also possible T11 fracture, which could be causing his back pain. CT shows possible developing pneumonia versus atelectasis. Pneumonia thought to be less likely, this patient does not have fever or cough, and has not been feeling sick. I will give the patient an incentive spirometer and encourage him to follow-up with primary care. Return precautions have been given. Patient understands agrees the plan.   Roxy Horseman, PA-C 02/05/15 1149  Laurence Spates, MD 02/05/15 [phone]

## 2015-02-05 NOTE — ED Notes (Signed)
50 mcg of fentanyl wasted and witnessed by Deeann Saint, RN

## 2015-02-05 NOTE — ED Notes (Signed)
Patient transported to X-ray 

## 2015-02-21 ENCOUNTER — Emergency Department (HOSPITAL_COMMUNITY): Payer: Medicare Other

## 2015-02-21 ENCOUNTER — Encounter (HOSPITAL_COMMUNITY): Payer: Self-pay | Admitting: *Deleted

## 2015-02-21 ENCOUNTER — Inpatient Hospital Stay (HOSPITAL_COMMUNITY)
Admission: EM | Admit: 2015-02-21 | Discharge: 2015-03-04 | DRG: 177 | Disposition: A | Discharge status: Hospice | Payer: Medicare Other | Attending: Internal Medicine | Admitting: Internal Medicine

## 2015-02-21 DIAGNOSIS — Z6822 Body mass index (BMI) 22.0-22.9, adult: Secondary | ICD-10-CM | POA: Diagnosis not present

## 2015-02-21 DIAGNOSIS — M546 Pain in thoracic spine: Secondary | ICD-10-CM | POA: Diagnosis not present

## 2015-02-21 DIAGNOSIS — R0600 Dyspnea, unspecified: Secondary | ICD-10-CM | POA: Diagnosis not present

## 2015-02-21 DIAGNOSIS — Z66 Do not resuscitate: Secondary | ICD-10-CM | POA: Diagnosis present

## 2015-02-21 DIAGNOSIS — K219 Gastro-esophageal reflux disease without esophagitis: Secondary | ICD-10-CM | POA: Diagnosis not present

## 2015-02-21 DIAGNOSIS — R011 Cardiac murmur, unspecified: Secondary | ICD-10-CM | POA: Diagnosis present

## 2015-02-21 DIAGNOSIS — R4182 Altered mental status, unspecified: Secondary | ICD-10-CM | POA: Diagnosis present

## 2015-02-21 DIAGNOSIS — I2510 Atherosclerotic heart disease of native coronary artery without angina pectoris: Secondary | ICD-10-CM | POA: Diagnosis not present

## 2015-02-21 DIAGNOSIS — R296 Repeated falls: Secondary | ICD-10-CM | POA: Diagnosis not present

## 2015-02-21 DIAGNOSIS — M549 Dorsalgia, unspecified: Secondary | ICD-10-CM

## 2015-02-21 DIAGNOSIS — R627 Adult failure to thrive: Secondary | ICD-10-CM | POA: Insufficient documentation

## 2015-02-21 DIAGNOSIS — R1314 Dysphagia, pharyngoesophageal phase: Secondary | ICD-10-CM | POA: Insufficient documentation

## 2015-02-21 DIAGNOSIS — R400 Somnolence: Secondary | ICD-10-CM | POA: Diagnosis not present

## 2015-02-21 DIAGNOSIS — R404 Transient alteration of awareness: Secondary | ICD-10-CM | POA: Diagnosis not present

## 2015-02-21 DIAGNOSIS — F419 Anxiety disorder, unspecified: Secondary | ICD-10-CM | POA: Diagnosis not present

## 2015-02-21 DIAGNOSIS — I10 Essential (primary) hypertension: Secondary | ICD-10-CM | POA: Diagnosis present

## 2015-02-21 DIAGNOSIS — N390 Urinary tract infection, site not specified: Secondary | ICD-10-CM | POA: Diagnosis not present

## 2015-02-21 DIAGNOSIS — H409 Unspecified glaucoma: Secondary | ICD-10-CM | POA: Diagnosis present

## 2015-02-21 DIAGNOSIS — N400 Enlarged prostate without lower urinary tract symptoms: Secondary | ICD-10-CM | POA: Diagnosis present

## 2015-02-21 DIAGNOSIS — Z515 Encounter for palliative care: Secondary | ICD-10-CM | POA: Insufficient documentation

## 2015-02-21 DIAGNOSIS — G9340 Encephalopathy, unspecified: Secondary | ICD-10-CM | POA: Diagnosis not present

## 2015-02-21 DIAGNOSIS — Z87891 Personal history of nicotine dependence: Secondary | ICD-10-CM | POA: Diagnosis not present

## 2015-02-21 DIAGNOSIS — E780 Pure hypercholesterolemia: Secondary | ICD-10-CM | POA: Diagnosis not present

## 2015-02-21 DIAGNOSIS — E118 Type 2 diabetes mellitus with unspecified complications: Secondary | ICD-10-CM | POA: Diagnosis not present

## 2015-02-21 DIAGNOSIS — L8990 Pressure ulcer of unspecified site, unspecified stage: Secondary | ICD-10-CM | POA: Diagnosis present

## 2015-02-21 DIAGNOSIS — E039 Hypothyroidism, unspecified: Secondary | ICD-10-CM | POA: Diagnosis not present

## 2015-02-21 DIAGNOSIS — J398 Other specified diseases of upper respiratory tract: Secondary | ICD-10-CM | POA: Insufficient documentation

## 2015-02-21 DIAGNOSIS — E119 Type 2 diabetes mellitus without complications: Secondary | ICD-10-CM

## 2015-02-21 DIAGNOSIS — J690 Pneumonitis due to inhalation of food and vomit: Secondary | ICD-10-CM | POA: Diagnosis present

## 2015-02-21 DIAGNOSIS — R1310 Dysphagia, unspecified: Secondary | ICD-10-CM

## 2015-02-21 DIAGNOSIS — Z0189 Encounter for other specified special examinations: Secondary | ICD-10-CM

## 2015-02-21 DIAGNOSIS — Z7982 Long term (current) use of aspirin: Secondary | ICD-10-CM | POA: Diagnosis not present

## 2015-02-21 DIAGNOSIS — E785 Hyperlipidemia, unspecified: Secondary | ICD-10-CM | POA: Diagnosis present

## 2015-02-21 DIAGNOSIS — M5489 Other dorsalgia: Secondary | ICD-10-CM | POA: Diagnosis not present

## 2015-02-21 LAB — CBC WITH DIFFERENTIAL/PLATELET
BASOS PCT: 0 % (ref 0–1)
Basophils Absolute: 0 10*3/uL (ref 0.0–0.1)
EOS ABS: 0.1 10*3/uL (ref 0.0–0.7)
EOS PCT: 1 % (ref 0–5)
HCT: 34.3 % — ABNORMAL LOW (ref 39.0–52.0)
Hemoglobin: 11.7 g/dL — ABNORMAL LOW (ref 13.0–17.0)
Lymphocytes Relative: 12 % (ref 12–46)
Lymphs Abs: 0.9 10*3/uL (ref 0.7–4.0)
MCH: 30.6 pg (ref 26.0–34.0)
MCHC: 34.1 g/dL (ref 30.0–36.0)
MCV: 89.8 fL (ref 78.0–100.0)
MONO ABS: 0.7 10*3/uL (ref 0.1–1.0)
Monocytes Relative: 10 % (ref 3–12)
Neutro Abs: 5.9 10*3/uL (ref 1.7–7.7)
Neutrophils Relative %: 77 % (ref 43–77)
PLATELETS: 314 10*3/uL (ref 150–400)
RBC: 3.82 MIL/uL — AB (ref 4.22–5.81)
RDW: 14.8 % (ref 11.5–15.5)
WBC: 7.7 10*3/uL (ref 4.0–10.5)

## 2015-02-21 LAB — COMPREHENSIVE METABOLIC PANEL
ALK PHOS: 66 U/L (ref 38–126)
ALT: 15 U/L — ABNORMAL LOW (ref 17–63)
AST: 19 U/L (ref 15–41)
Albumin: 3.5 g/dL (ref 3.5–5.0)
Anion gap: 9 (ref 5–15)
BILIRUBIN TOTAL: 0.5 mg/dL (ref 0.3–1.2)
BUN: 15 mg/dL (ref 6–20)
CALCIUM: 8.9 mg/dL (ref 8.9–10.3)
CO2: 27 mmol/L (ref 22–32)
Chloride: 96 mmol/L — ABNORMAL LOW (ref 101–111)
Creatinine, Ser: 0.77 mg/dL (ref 0.61–1.24)
GFR calc Af Amer: >60 mL/min (ref >60)
GFR calc non Af Amer: >60 mL/min (ref >60)
Glucose, Bld: 107 mg/dL — ABNORMAL HIGH (ref 65–99)
Potassium: 3.9 mmol/L (ref 3.5–5.1)
Sodium: 132 mmol/L — ABNORMAL LOW (ref 135–145)
TOTAL PROTEIN: 5.9 g/dL — AB (ref 6.5–8.1)

## 2015-02-21 LAB — URINE MICROSCOPIC-ADD ON

## 2015-02-21 LAB — URINALYSIS, ROUTINE W REFLEX MICROSCOPIC
BILIRUBIN URINE: NEGATIVE
GLUCOSE, UA: NEGATIVE mg/dL
KETONES UR: NEGATIVE mg/dL
NITRITE: NEGATIVE
Protein, ur: NEGATIVE mg/dL
Specific Gravity, Urine: 1.016 (ref 1.005–1.030)
Urobilinogen, UA: 0.2 mg/dL (ref 0.0–1.0)
pH: 6 (ref 5.0–8.0)

## 2015-02-21 LAB — CBG MONITORING, ED: GLUCOSE-CAPILLARY: 93 mg/dL (ref 65–99)

## 2015-02-21 MED ORDER — ONDANSETRON HCL 4 MG/2ML IJ SOLN: 4.0000 mg | Freq: Four times a day (QID) | INTRAMUSCULAR | Status: DC | PRN

## 2015-02-21 MED ORDER — ENOXAPARIN SODIUM 40 MG/0.4ML ~~LOC~~ SOLN
40.0000 mg | SUBCUTANEOUS | Status: DC
  Administered 2015-02-21 – 2015-03-02 (×10): 40 mg via SUBCUTANEOUS
  Filled 2015-02-21 (×10): qty 0.4

## 2015-02-21 MED ORDER — FERROUS SULFATE 325 (65 FE) MG PO TBEC
325.0000 mg | DELAYED_RELEASE_TABLET | Freq: Two times a day (BID) | ORAL | Status: DC
  Administered 2015-02-21 – 2015-02-24 (×6): 325 mg via ORAL
  Filled 2015-02-21 (×9): qty 1

## 2015-02-21 MED ORDER — DEXTROSE 5 % IV SOLN
1.0000 g | Freq: Once | INTRAVENOUS | Status: AC
  Administered 2015-02-21: 1 g via INTRAVENOUS
  Filled 2015-02-21: qty 10

## 2015-02-21 MED ORDER — LOSARTAN POTASSIUM 50 MG PO TABS
100.0000 mg | ORAL_TABLET | Freq: Every day | ORAL | Status: DC
  Administered 2015-02-22 – 2015-02-23 (×2): 100 mg via ORAL
  Filled 2015-02-21 (×3): qty 2

## 2015-02-21 MED ORDER — FINASTERIDE 5 MG PO TABS
5.0000 mg | ORAL_TABLET | Freq: Every day | ORAL | Status: DC
  Administered 2015-02-22 – 2015-02-24 (×3): 5 mg via ORAL
  Filled 2015-02-21 (×3): qty 1

## 2015-02-21 MED ORDER — ASPIRIN 325 MG PO TABS
325.0000 mg | ORAL_TABLET | Freq: Every day | ORAL | Status: DC
  Administered 2015-02-22 – 2015-02-24 (×3): 325 mg via ORAL
  Filled 2015-02-21 (×3): qty 1

## 2015-02-21 MED ORDER — THIAMINE HCL 100 MG/ML IJ SOLN
100.0000 mg | Freq: Once | INTRAMUSCULAR | Status: AC
  Administered 2015-02-21: 100 mg via INTRAVENOUS
  Filled 2015-02-21: qty 2

## 2015-02-21 MED ORDER — PANTOPRAZOLE SODIUM 40 MG PO TBEC
40.0000 mg | DELAYED_RELEASE_TABLET | Freq: Every day | ORAL | Status: DC
  Administered 2015-02-22 – 2015-02-24 (×3): 40 mg via ORAL
  Filled 2015-02-21 (×3): qty 1

## 2015-02-21 MED ORDER — LEVOTHYROXINE SODIUM 50 MCG PO TABS
50.0000 ug | ORAL_TABLET | Freq: Every day | ORAL | Status: DC
  Administered 2015-02-22 – 2015-02-24 (×3): 50 ug via ORAL
  Filled 2015-02-21 (×3): qty 1

## 2015-02-21 MED ORDER — SODIUM CHLORIDE 0.9 % IV BOLUS (SEPSIS)
1000.0000 mL | Freq: Once | INTRAVENOUS | Status: AC
  Administered 2015-02-21: 1000 mL via INTRAVENOUS

## 2015-02-21 MED ORDER — DEXTROSE 5 % IV SOLN
1.0000 g | INTRAVENOUS | Status: DC
Start: 2015-02-22 — End: 2015-02-23
  Administered 2015-02-22: 1 g via INTRAVENOUS
  Filled 2015-02-21 (×2): qty 10

## 2015-02-21 MED ORDER — ONDANSETRON HCL 4 MG PO TABS: 4.0000 mg | ORAL_TABLET | Freq: Four times a day (QID) | ORAL | Status: DC | PRN

## 2015-02-21 MED ORDER — LATANOPROST 0.005 % OP SOLN
1.0000 [drp] | Freq: Every day | OPHTHALMIC | Status: DC
  Administered 2015-02-21 – 2015-03-03 (×11): 1 [drp] via OPHTHALMIC
  Filled 2015-02-21 (×2): qty 2.5

## 2015-02-21 MED ORDER — METFORMIN HCL 500 MG PO TABS: 500.0000 mg | ORAL_TABLET | Freq: Three times a day (TID) | ORAL | Status: DC

## 2015-02-21 MED ORDER — ADULT MULTIVITAMIN W/MINERALS CH
1.0000 | ORAL_TABLET | Freq: Every day | ORAL | Status: DC
  Administered 2015-02-22 – 2015-02-24 (×3): 1 via ORAL
  Filled 2015-02-21 (×3): qty 1

## 2015-02-21 MED ORDER — TETANUS-DIPHTH-ACELL PERTUSSIS 5-2.5-18.5 LF-MCG/0.5 IM SUSP
0.5000 mL | Freq: Once | INTRAMUSCULAR | Status: AC
  Administered 2015-02-21: 0.5 mL via INTRAMUSCULAR
  Filled 2015-02-21: qty 0.5

## 2015-02-21 MED ORDER — TAMSULOSIN HCL 0.4 MG PO CAPS
0.4000 mg | ORAL_CAPSULE | Freq: Every day | ORAL | Status: DC
  Administered 2015-02-22 – 2015-02-23 (×2): 0.4 mg via ORAL
  Filled 2015-02-21 (×2): qty 1

## 2015-02-21 MED ORDER — PRAVASTATIN SODIUM 20 MG PO TABS
20.0000 mg | ORAL_TABLET | Freq: Every evening | ORAL | Status: DC
  Administered 2015-02-21 – 2015-02-23 (×3): 20 mg via ORAL
  Filled 2015-02-21 (×3): qty 1

## 2015-02-21 NOTE — ED Provider Notes (Signed)
CSN: [id number]     Arrival date & time 02/21/15  1359 History   First MD Initiated Contact with Patient 02/21/15 1600     Chief Complaint  Patient presents with  . Fall     (Consider location/radiation/quality/duration/timing/severity/associated sxs/prior Treatment) HPI Level V caveat altered mental status patient is disoriented history is obtained from son and from granddaughter Patient has fallen 4 times in the past 9 days including one time 9 days ago fell again 5 days ago and twice yesterday. Patient denies pain anywhere. He is also had diminished appetite and increased confusion over the past several weeks. He presently denies pain anywhere. No fever. He was seen here on 02/05/2015 for fall. Had diagnostic workup including chest CT scan which showed spiculated lung mass and atelectasis versus pneumonia. He was sent home with incentive spirometer. Past Medical History  Diagnosis Date  . Diabetes mellitus without complication   . Hypertension   . Hypercholesteremia   . Heart murmur   . Frequent urination   . BPH (benign prostatic hyperplasia)   . GERD (gastroesophageal reflux disease)   . Coronary artery disease     medical management with aspirin  . Glaucoma    Past Surgical History  Procedure Laterality Date  . Leg surgery  5 years ago    Rod in right leg  . Back surgery  years ago    lower  . Hernia repair  years ago  . Cystoscopy with retrograde pyelogram, ureteroscopy and stent placement Bilateral 12/12/2014    Procedure: CYSTO WITH BILATERAL RETROGRADE/BIOPSY WITH FULGURATION/HYDRODISTENSION OF BLADDER;  Surgeon: Bjorn Pippin, MD;  Location: [facility];  Service: Urology;  Laterality: Bilateral;   No family history on file. Social History  Substance Use Topics  . Smoking status: Former Smoker -- 1.00 packs/day for 30 years    Types: Cigarettes    Quit date: 06/28/1976  . Smokeless tobacco: Never Used  . Alcohol Use: No    Review of Systems  Unable to perform ROS:  Mental status change  Musculoskeletal: Positive for gait problem.  Allergic/Immunologic: Positive for immunocompromised state.       Diabetic      Allergies  Review of patient's allergies indicates no known allergies.  Home Medications   Prior to Admission medications   Medication Sig Start Date End Date Taking? Authorizing Provider  aspirin 325 MG tablet Take 325 mg by mouth daily.    Historical Provider, MD  ferrous sulfate 325 (65 FE) MG EC tablet Take 325 mg by mouth 2 (two) times daily.    Historical Provider, MD  finasteride (PROSCAR) 5 MG tablet Take 5 mg by mouth daily.    Historical Provider, MD  HYDROcodone-acetaminophen (NORCO/VICODIN) 5-325 MG per tablet Take 1-2 tablets by mouth every 6 (six) hours as needed. 02/05/15   Roxy Horseman, PA-C  ibandronate (BONIVA) 150 MG tablet Take 150 mg by mouth every 30 (thirty) days. 11/12/14   Historical Provider, MD  levothyroxine (SYNTHROID, LEVOTHROID) 50 MCG tablet Take 50 mcg by mouth daily before breakfast.    Historical Provider, MD  losartan (COZAAR) 100 MG tablet Take 100 mg by mouth daily.    Historical Provider, MD  metFORMIN (GLUCOPHAGE) 500 MG tablet Take 500 mg by mouth 4 (four) times daily -  with meals and at bedtime.    Historical Provider, MD  Multiple Vitamin (MULTIVITAMIN WITH MINERALS) TABS tablet Take 1 tablet by mouth daily.    Historical Provider, MD  Multiple Vitamins-Minerals (ICAPS PO) Take 1  capsule by mouth 2 (two) times daily.    Historical Provider, MD  omeprazole (PRILOSEC) 20 MG capsule Take 40 mg by mouth 2 (two) times daily.     Historical Provider, MD  phenazopyridine (PYRIDIUM) 100 MG tablet Take 1 tablet (100 mg total) by mouth 3 (three) times daily as needed for pain. Patient not taking: Reported on 01/09/2015 12/12/14   Bjorn Pippin, MD  pravastatin (PRAVACHOL) 20 MG tablet Take 20 mg by mouth every evening.     Historical Provider, MD  tamsulosin (FLOMAX) 0.4 MG CAPS capsule Take 0.4 mg by mouth daily.     Historical Provider, MD  traMADol (ULTRAM) 50 MG tablet Take 1 tablet (50 mg total) by mouth every 6 (six) hours as needed. Patient not taking: Reported on 01/09/2015 12/12/14   Bjorn Pippin, MD  Travoprost, BAK Free, (TRAVATAN) 0.004 % SOLN ophthalmic solution Place 1 drop into both eyes at bedtime.    Historical Provider, MD   BP 163/66 mmHg  Pulse 79  Temp(Src) 97.6 F (36.4 C) (Oral)  Resp 18  SpO2 99% Physical Exam  Constitutional: No distress.  Frail-appearing and chronically ill-appearing  HENT:  Head: Normocephalic and atraumatic.  Eyes: Conjunctivae are normal. Pupils are equal, round, and reactive to light.  Neck: Neck supple. No tracheal deviation present. No thyromegaly present.  Cardiovascular: Normal rate and regular rhythm.   No murmur heard. Pulmonary/Chest: Effort normal and breath sounds normal.  Abdominal: Soft. Bowel sounds are normal. He exhibits no distension. There is no tenderness.  Genitourinary: Penis normal.  Musculoskeletal: Normal range of motion. He exhibits no edema or tenderness.  Neurological: He is alert. No cranial nerve deficit. Coordination normal.  Oriented to name and hospital does not know month or year follow some commands moves all extremities well  Skin: Skin is warm and dry. No rash noted.  Purplish ecchymosis at right flank. Tiny abrasion to right elbow.  Psychiatric: He has a normal mood and affect.  Nursing note and vitals reviewed.   ED Course  Procedures (including critical care time) Labs Review Labs Reviewed  URINALYSIS, ROUTINE W REFLEX MICROSCOPIC (NOT AT [facility]) - Abnormal; Notable for the following:    APPearance CLOUDY (*)    Hgb urine dipstick LARGE (*)    Leukocytes, UA LARGE (*)    All other components within normal limits  URINE MICROSCOPIC-ADD ON    Imaging Review Dg Hip Unilat  With Pelvis 2-3 Views Right  02/21/2015   CLINICAL DATA:  Multiple falls over the past 10 days. Right hip pain and bruising. History of  prior right intertrochanteric fracture. Initial encounter.  EXAM: DG HIP (WITH OR WITHOUT PELVIS) 2-3V RIGHT  COMPARISON:  Single view of the abdomen 12/26/2014.  FINDINGS: A hip screw and long IM nail are in place for fixation of a healed right intertrochanteric fracture. No acute fracture is identified. No notable degenerative change is identified. No focal bony lesion is seen.  IMPRESSION: No acute abnormality. Healed right intertrochanteric fracture with fixation hardware in place.   Electronically Signed   By: Drusilla Kanner M.D.   On: 02/21/2015 15:45   I have personally reviewed and evaluated these images and lab results as part of my medical decision-making.   EKG Interpretation   Date/Time:  Friday February 21 2015 17:05:10 EDT Ventricular Rate:  77 PR Interval:  149 QRS Duration: 79 QT Interval:  347 QTC Calculation: 393 R Axis:   -7 Text Interpretation:  Sinus rhythm No significant change since  last  tracing Confirmed by Ethelda Chick  MD, Marthann Abshier [id number]) on 02/21/2015 5:11:15 PM     Results for orders placed or performed during the hospital encounter of 02/21/15  Urinalysis, Routine w reflex microscopic (not at [facility])  Result Value Ref Range   Color, Urine YELLOW YELLOW   APPearance CLOUDY (A) CLEAR   Specific Gravity, Urine 1.016 1.005 - 1.030   pH 6.0 5.0 - 8.0   Glucose, UA NEGATIVE NEGATIVE mg/dL   Hgb urine dipstick LARGE (A) NEGATIVE   Bilirubin Urine NEGATIVE NEGATIVE   Ketones, ur NEGATIVE NEGATIVE mg/dL   Protein, ur NEGATIVE NEGATIVE mg/dL   Urobilinogen, UA 0.2 0.0 - 1.0 mg/dL   Nitrite NEGATIVE NEGATIVE   Leukocytes, UA LARGE (A) NEGATIVE  Urine microscopic-add on  Result Value Ref Range   Squamous Epithelial / LPF RARE RARE   WBC, UA TOO NUMEROUS TO COUNT <3 WBC/hpf   RBC / HPF 11-20 <3 RBC/hpf   Bacteria, UA RARE RARE  Comprehensive metabolic panel  Result Value Ref Range   Sodium 132 (L) 135 - 145 mmol/L   Potassium 3.9 3.5 - 5.1 mmol/L   Chloride 96 (L)  101 - 111 mmol/L   CO2 27 22 - 32 mmol/L   Glucose, Bld 107 (H) 65 - 99 mg/dL   BUN 15 6 - 20 mg/dL   Creatinine, Ser 6.04 0.61 - 1.24 mg/dL   Calcium 8.9 8.9 - 54.0 mg/dL   Total Protein 5.9 (L) 6.5 - 8.1 g/dL   Albumin 3.5 3.5 - 5.0 g/dL   AST 19 15 - 41 U/L   ALT 15 (L) 17 - 63 U/L   Alkaline Phosphatase 66 38 - 126 U/L   Total Bilirubin 0.5 0.3 - 1.2 mg/dL   GFR calc non Af Amer >60 >60 mL/min   GFR calc Af Amer >60 >60 mL/min   Anion gap 9 5 - 15  CBC with Differential/Platelet  Result Value Ref Range   WBC 7.7 4.0 - 10.5 K/uL   RBC 3.82 (L) 4.22 - 5.81 MIL/uL   Hemoglobin 11.7 (L) 13.0 - 17.0 g/dL   HCT 98.1 (L) 19.1 - 47.8 %   MCV 89.8 78.0 - 100.0 fL   MCH 30.6 26.0 - 34.0 pg   MCHC 34.1 30.0 - 36.0 g/dL   RDW 29.5 62.1 - 30.8 %   Platelets 314 150 - 400 K/uL   Neutrophils Relative % 77 43 - 77 %   Neutro Abs 5.9 1.7 - 7.7 K/uL   Lymphocytes Relative 12 12 - 46 %   Lymphs Abs 0.9 0.7 - 4.0 K/uL   Monocytes Relative 10 3 - 12 %   Monocytes Absolute 0.7 0.1 - 1.0 K/uL   Eosinophils Relative 1 0 - 5 %   Eosinophils Absolute 0.1 0.0 - 0.7 K/uL   Basophils Relative 0 0 - 1 %   Basophils Absolute 0.0 0.0 - 0.1 K/uL  CBG monitoring, ED  Result Value Ref Range   Glucose-Capillary 93 65 - 99 mg/dL   Dg Chest 2 View  6/57/8469   CLINICAL DATA:  Multiple falls yesterday, increased confusion, incontinence, decreased appetite, history hypertension, diabetes mellitus, coronary artery disease, former smoker  EXAM: CHEST  2 VIEW  COMPARISON:  02/13/2015  FINDINGS: Low lung volumes.  Upper normal heart size.  Atherosclerotic calcification aorta.  Pulmonary vascular congestion.  Bibasilar atelectasis, increased.  Apices clear.  Questionable small RIGHT pleural effusion.  No pneumothorax.  Bones demineralized.  IMPRESSION: Decreased  lung volumes with increased bibasilar atelectasis and questionable small RIGHT pleural effusion.   Electronically Signed   By: Ulyses Southward M.D.   On:  02/21/2015 18:17   Dg Chest 2 View  02/05/2015   CLINICAL DATA:  Larey Seat. Week and soreness all over. Tenderness in the back and right shoulder area.  EXAM: CHEST  2 VIEW  COMPARISON:  04/28/2014  FINDINGS: There are old right rib fractures. Age-indeterminate fractures involving the left fifth and sixth ribs. Densities at the left lung base could represent atelectasis or scarring. Heart size is grossly normal with atherosclerotic calcifications at the aorta. Negative for a pneumothorax. Again noted is a compression fracture in the upper lumbar spine.  IMPRESSION: Low lung volumes bilaterally.  Bilateral rib fractures. Left rib fractures are age indeterminate and new from the prior examination.   Electronically Signed   By: Richarda Overlie M.D.   On: 02/05/2015 07:55   Dg Lumbar Spine Complete  02/05/2015   CLINICAL DATA:  Injury, fall in the bathroom  EXAM: LUMBAR SPINE - COMPLETE 4+ VIEW  COMPARISON:  01/03/2015  FINDINGS: Five views of the lumbar spine submitted. There is diffuse osteopenia. Atherosclerotic calcifications of abdominal aorta and iliac arteries. Again noted compression deformity upper endplate of L1 vertebral body is stable from prior exam. There is disc space flattening with mild anterior spurring at T12-L1 and L1-L2 level. Mild anterior spurring upper endplate of L3 and L4 vertebral body. Minimal disc space flattening at L5-S1 level. There is mild lower lumbar levoscoliosis.  IMPRESSION: No acute fracture or subluxation. Mild lower lumbar levoscoliosis. Degenerative changes as described above. Stable compression fracture upper endplate of L1 vertebral body. Diffuse osteopenia.   Electronically Signed   By: Natasha Mead M.D.   On: 02/05/2015 08:05   Dg Shoulder Right  02/05/2015   CLINICAL DATA:  Larey Seat with weakness. Tenderness in the back and right shoulder area.  EXAM: RIGHT SHOULDER - 2+ VIEW  COMPARISON:  04/28/2014  FINDINGS: There are old right rib fractures. The right shoulder is located  without a fracture. Right AC joint is intact. Evidence for spurring and degenerative changes at the right glenohumeral joint.  IMPRESSION: No acute bone abnormality in the right shoulder.   Electronically Signed   By: Richarda Overlie M.D.   On: 02/05/2015 08:00   Ct Head Wo Contrast  02/21/2015   CLINICAL DATA:  Multiple falls with 2 yesterday. Confusion. Incontinence. Decreased appetite.  EXAM: CT HEAD WITHOUT CONTRAST  TECHNIQUE: Contiguous axial images were obtained from the base of the skull through the vertex without intravenous contrast.  COMPARISON:  None  FINDINGS: Sinuses/Soft tissues: Surgical changes about the globes. Mucosal thickening of the left maxillary sinus. No skull fracture. Clear mastoid air cells.  Intracranial: Expected cerebral atrophy. Moderate low density in the periventricular white matter likely related to small vessel disease. No mass lesion, hemorrhage, hydrocephalus, acute infarct, intra-axial, or extra-axial fluid collection.  IMPRESSION: 1.  No acute intracranial abnormality. 2.  Cerebral atrophy and small vessel ischemic change. 3. Mild sinus disease.   Electronically Signed   By: Jeronimo Greaves M.D.   On: 02/21/2015 17:56   Ct Head Wo Contrast  02/05/2015   CLINICAL DATA:  Fall.  Hit head.  EXAM: CT HEAD WITHOUT CONTRAST  TECHNIQUE: Contiguous axial images were obtained from the base of the skull through the vertex without intravenous contrast.  COMPARISON:  None.  FINDINGS: There is diffuse low attenuation throughout the subcortical and periventricular white matter  compatible with chronic microvascular disease. Prominence of the sulci and the ventricles noted consistent with brain atrophy. No abnormal extra-axial fluid collections, intracranial hemorrhage or mass noted. There is mild mucosal thickening involving the maxillary sinuses. The paranasal sinuses are clear. The mastoid air cells appear clear. The calvarium is intact.  IMPRESSION: 1. No acute intracranial abnormality. 2.  Chronic microvascular disease and brain atrophy.   Electronically Signed   By: Signa Kell M.D.   On: 02/05/2015 08:26   Ct Chest W Contrast  02/05/2015   CLINICAL DATA:  Fall.  Right rib pain  EXAM: CT CHEST WITH CONTRAST  TECHNIQUE: Multidetector CT imaging of the chest was performed during intravenous contrast administration.  CONTRAST:  OMNIPAQUE IOHEXOL 300 MG/ML  SOLN  COMPARISON:  Chest x-ray 02/05/2015.  No prior chest CT for review  FINDINGS: Left upper lobe mass lesion measures 3.5 x 11.6 cm. This has spiculated margins and is worrisome for carcinoma. Additional spiculated mass in the superior segment left lower lobe adjacent to the major fissure measures approximately 23 x 13 mm. These lesions are difficult to see on chest x-ray due to their location. Both lesions are worrisome for carcinoma. No mediastinal adenopathy.  Chronic lung disease with probable interstitial scarring on the right.  Right lower lobe airspace disease may represent atelectasis or pneumonia. Small right effusion. Mild left lower lobe atelectasis.  Coronary artery calcification. Heart size normal. Atherosclerotic calcification aortic arch without aneurysm or dissection. No central pulmonary embolus.  Chronic right rib fractures with deformity. No acute right rib fracture. Left rib fractures appear subacute with callus formation. No acute left rib fracture.  Negative for mass in the upper abdomen.  Mild compression fracture at T11 of indeterminate age.  IMPRESSION: Spiculated mass left upper lobe and superior segment left lower lobe. These findings are concerning for carcinoma of the lung. Pneumonia and fibrosis considered less likely. PET-CT recommended for further evaluation.  Probable chronic lung disease with interstitial scarring on the right. Right lower lobe atelectasis/ infiltrate. Small right effusion. Pneumonia not excluded on the right.  Bilateral rib fractures appear subacute to chronic. No acute rib fracture.   Mild compression fracture T11, possibly acute.  These results were called by telephone at the time of interpretation on 02/05/2015 at 11:18 am to [facility] , who verbally acknowledged these results.   Electronically Signed   By: Marlan Palau M.D.   On: 02/05/2015 11:19   Ct Cervical Spine Wo Contrast  02/05/2015   CLINICAL DATA:  81 year old with recent fall. Head laceration. Complains of pain in the upper back.  EXAM: CT CERVICAL SPINE WITHOUT CONTRAST  TECHNIQUE: Multidetector CT imaging of the cervical spine was performed without intravenous contrast. Multiplanar CT image reconstructions were also generated.  COMPARISON:  01/25/2008  FINDINGS: Negative for an acute fracture dislocation in the cervical spine. There is mild sclerosis and deformity of the T3 superior endplate which appears new since 2009. There is mild retrolisthesis at C3-C4 with disc space narrowing. Bilateral facet arthropathy. No significant soft tissue edema. No significant neck lymphadenopathy.  Images of the upper lungs demonstrate a 2.7 x 1.1 cm opacity in the medial left upper lobe. This opacity contains some scattered calcifications. There are additional patchy opacities and calcifications in the right upper lung. There is also a small focal opacity in the medial superior segment of the left lower lobe measuring up to 1.5 cm.  IMPRESSION: Degenerative changes in cervical spine without acute bone abnormality.  Mild compression  deformity of the T3 vertebral body of unknown age. Legrand Rams an old injury based on the sclerosis but recommend clinical correlation.  There are focal parenchymal densities or lesions in the left upper lung, particularly in the left upper lobe. These appear to be new since 2009. Findings are nonspecific but a neoplastic process cannot be excluded. Recommend further characterization with a post contrast chest CT.  These results were called by telephone at the time of interpretation on 02/05/2015 at 8:42 am to  [facility], PA-C, who verbally acknowledged these results.   Electronically Signed   By: Richarda Overlie M.D.   On: 02/05/2015 08:52   Dg Hip Unilat  With Pelvis 2-3 Views Right  02/21/2015   CLINICAL DATA:  Multiple falls over the past 10 days. Right hip pain and bruising. History of prior right intertrochanteric fracture. Initial encounter.  EXAM: DG HIP (WITH OR WITHOUT PELVIS) 2-3V RIGHT  COMPARISON:  Single view of the abdomen 12/26/2014.  FINDINGS: A hip screw and long IM nail are in place for fixation of a healed right intertrochanteric fracture. No acute fracture is identified. No notable degenerative change is identified. No focal bony lesion is seen.  IMPRESSION: No acute abnormality. Healed right intertrochanteric fracture with fixation hardware in place.   Electronically Signed   By: Drusilla Kanner M.D.   On: 02/21/2015 15:45    MDM  Spoke with family members and patient. Patient is DO NOT RESUSCITATE CODE STATUS. I spoke with Dr. Sanda Klein. Plan admit medical surgical floor. He will need intravenous antibiotics for urinary tract infection. Suggest neurologic consult, and consultation with social worker and case management Final diagnoses:  None   diagnosis #1 altered mental status #2 multiple falls #3 contusions multiple sites #4 urinary tract infection      Doug Sou, MD 02/21/15 1919

## 2015-02-21 NOTE — ED Notes (Signed)
Pt family reports multiple fall with 2 yesterday. Pt states that he was suppose to have PT/OT evaluate. Pt family also reports increased confusion, incontinence, and decreased appetite.

## 2015-02-21 NOTE — ED Notes (Signed)
MD Jacubowitz at the bedside   

## 2015-02-21 NOTE — Progress Notes (Signed)
Report received from Dahlia Client, RN from ED. Pt is to be admitted to 5w15. Awaiting pt's arrival.

## 2015-02-21 NOTE — ED Notes (Signed)
Patient to be transported upstairs by Ronaldo Miyamoto, EMT.

## 2015-02-21 NOTE — ED Notes (Signed)
CBG 93 

## 2015-02-21 NOTE — H&P (Signed)
[facility] History and Physical  Kurt Navarro ZOX:[id number] DOB: 31-Jul-1926 DOA: 02/21/2015  Referring physician: Doug Sou, MD PCP: Thayer Headings, MD   Chief Complaint: Fall and change in mental status.  HPI: Kurt Navarro is a 81 y.o. male with a past medical history of type 2 diabetes, hypertension, hyperlipidemia, heart murmur, BPH, GERD, coronary artery disease, glaucoma who was brought in by his son to the emergency department due to progressive weakness associated with 4 falls in the last 9 days and confusion. Apparently the patient has not hit his head, he hasn't have fever or chills, but he is usually alert oriented 3 and has been unable to tell the current date even down to the year, as well as being confused with other events for the past week plus.He is he is oriented to place and is currently in no acute distress. He is unable to provide further details on his history.   Review of Systems:  Unable to obtain.  Past Medical History  Diagnosis Date  . Diabetes mellitus without complication   . Hypertension   . Hypercholesteremia   . Heart murmur   . Frequent urination   . BPH (benign prostatic hyperplasia)   . GERD (gastroesophageal reflux disease)   . Coronary artery disease     medical management with aspirin  . Glaucoma    Past Surgical History  Procedure Laterality Date  . Leg surgery  5 years ago    Rod in right leg  . Back surgery  years ago    lower  . Hernia repair  years ago  . Cystoscopy with retrograde pyelogram, ureteroscopy and stent placement Bilateral 12/12/2014    Procedure: CYSTO WITH BILATERAL RETROGRADE/BIOPSY WITH FULGURATION/HYDRODISTENSION OF BLADDER;  Surgeon: Bjorn Pippin, MD;  Location: [facility];  Service: Urology;  Laterality: Bilateral;   Social History:  reports that he quit smoking about 38 years ago. His smoking use included Cigarettes. He has a 30 pack-year smoking history. He has never used smokeless tobacco. He reports that  he does not drink alcohol or use illicit drugs.  No Known Allergies  No family history on file.   Prior to Admission medications   Medication Sig Start Date End Date Taking? Authorizing Provider  aspirin 325 MG tablet Take 325 mg by mouth daily.   Yes Historical Provider, MD  ferrous sulfate 325 (65 FE) MG EC tablet Take 325 mg by mouth 2 (two) times daily.   Yes Historical Provider, MD  finasteride (PROSCAR) 5 MG tablet Take 5 mg by mouth daily.   Yes Historical Provider, MD  ibandronate (BONIVA) 150 MG tablet Take 150 mg by mouth every 30 (thirty) days. 11/12/14  Yes Historical Provider, MD  levothyroxine (SYNTHROID, LEVOTHROID) 50 MCG tablet Take 50 mcg by mouth daily before breakfast.   Yes Historical Provider, MD  losartan (COZAAR) 100 MG tablet Take 100 mg by mouth daily.   Yes Historical Provider, MD  metFORMIN (GLUCOPHAGE) 500 MG tablet Take 500 mg by mouth 4 (four) times daily -  with meals and at bedtime.   Yes Historical Provider, MD  Multiple Vitamin (MULTIVITAMIN WITH MINERALS) TABS tablet Take 1 tablet by mouth daily.   Yes Historical Provider, MD  Multiple Vitamins-Minerals (ICAPS PO) Take 1 capsule by mouth 2 (two) times daily.   Yes Historical Provider, MD  naproxen sodium (ANAPROX) 220 MG tablet Take 220 mg by mouth 2 (two) times daily as needed (pain).   Yes Historical Provider, MD  omeprazole (PRILOSEC) 20 MG  capsule Take 40 mg by mouth 2 (two) times daily.    Yes Historical Provider, MD  pravastatin (PRAVACHOL) 20 MG tablet Take 20 mg by mouth every evening.    Yes Historical Provider, MD  tamsulosin (FLOMAX) 0.4 MG CAPS capsule Take 0.4 mg by mouth daily after supper.    Yes Historical Provider, MD  Travoprost, BAK Free, (TRAVATAN) 0.004 % SOLN ophthalmic solution Place 1 drop into both eyes at bedtime.   Yes Historical Provider, MD  HYDROcodone-acetaminophen (NORCO/VICODIN) 5-325 MG per tablet Take 1-2 tablets by mouth every 6 (six) hours as needed. Patient not taking:  Reported on 02/21/2015 02/05/15   Roxy Horseman, PA-C  phenazopyridine (PYRIDIUM) 100 MG tablet Take 1 tablet (100 mg total) by mouth 3 (three) times daily as needed for pain. Patient not taking: Reported on 01/09/2015 12/12/14   Bjorn Pippin, MD  traMADol (ULTRAM) 50 MG tablet Take 1 tablet (50 mg total) by mouth every 6 (six) hours as needed. Patient not taking: Reported on 01/09/2015 12/12/14   Bjorn Pippin, MD   Physical Exam: Filed Vitals:   02/21/15 1915 02/21/15 1945 02/21/15 2015 02/21/15 2121  BP: 177/74 170/69 176/67 165/74  Pulse: 81 76 77 74  Temp:   97.6 F (36.4 C) 98 F (36.7 C)  TempSrc:   Oral Oral  Resp: 27 14 16 15   Height:   5\' 5"  (1.651 m)   Weight:   65.4 kg (144 lb 2.9 oz)   SpO2: 98% 97% 96% 99%    Wt Readings from Last 3 Encounters:  02/21/15 65.4 kg (144 lb 2.9 oz)  12/12/14 66.225 kg (146 lb)  12/11/14 66.225 kg (146 lb)    General:  Appears calm and comfortable Eyes: PERRL, normal lids, irises & conjunctiva ENT: grossly normal hearing, lips & tongue Neck: no LAD, masses or thyromegaly Cardiovascular: RRR,. No LE edema. Telemetry: SR, no arrhythmias  Respiratory: CTA bilaterally, no w/r/r. Normal respiratory effort. Abdomen: soft, ntnd Skin: no rash or induration seen on limited exam Musculoskeletal: grossly normal tone BUE/BLE Psychiatric: grossly normal mood and affect, speech fluent, but confused. Neurologic: Awake alert oriented 2           Labs on Admission:  Basic Metabolic Panel:  Recent Labs Lab 02/21/15 1719  NA 132*  K 3.9  CL 96*  CO2 27  GLUCOSE 107*  BUN 15  CREATININE 0.77  CALCIUM 8.9   Liver Function Tests:  Recent Labs Lab 02/21/15 1719  AST 19  ALT 15*  ALKPHOS 66  BILITOT 0.5  PROT 5.9*  ALBUMIN 3.5   No results for input(s): LIPASE, AMYLASE in the last 168 hours. No results for input(s): AMMONIA in the last 168 hours. CBC:  Recent Labs Lab 02/21/15 1719  WBC 7.7  NEUTROABS 5.9  HGB 11.7*  HCT 34.3*    MCV 89.8  PLT 314     Recent Labs Lab 02/21/15 1708  GLUCAP 93    Radiological Exams on Admission: Dg Chest 2 View  02/21/2015   CLINICAL DATA:  Multiple falls yesterday, increased confusion, incontinence, decreased appetite, history hypertension, diabetes mellitus, coronary artery disease, former smoker  EXAM: CHEST  2 VIEW  COMPARISON:  02/13/2015  FINDINGS: Low lung volumes.  Upper normal heart size.  Atherosclerotic calcification aorta.  Pulmonary vascular congestion.  Bibasilar atelectasis, increased.  Apices clear.  Questionable small RIGHT pleural effusion.  No pneumothorax.  Bones demineralized.  IMPRESSION: Decreased lung volumes with increased bibasilar atelectasis and questionable small RIGHT pleural  effusion.   Electronically Signed   By: Ulyses Southward M.D.   On: 02/21/2015 18:17   Ct Head Wo Contrast  02/21/2015   CLINICAL DATA:  Multiple falls with 2 yesterday. Confusion. Incontinence. Decreased appetite.  EXAM: CT HEAD WITHOUT CONTRAST  TECHNIQUE: Contiguous axial images were obtained from the base of the skull through the vertex without intravenous contrast.  COMPARISON:  None  FINDINGS: Sinuses/Soft tissues: Surgical changes about the globes. Mucosal thickening of the left maxillary sinus. No skull fracture. Clear mastoid air cells.  Intracranial: Expected cerebral atrophy. Moderate low density in the periventricular white matter likely related to small vessel disease. No mass lesion, hemorrhage, hydrocephalus, acute infarct, intra-axial, or extra-axial fluid collection.  IMPRESSION: 1.  No acute intracranial abnormality. 2.  Cerebral atrophy and small vessel ischemic change. 3. Mild sinus disease.   Electronically Signed   By: Jeronimo Greaves M.D.   On: 02/21/2015 17:56   Dg Hip Unilat  With Pelvis 2-3 Views Right  02/21/2015   CLINICAL DATA:  Multiple falls over the past 10 days. Right hip pain and bruising. History of prior right intertrochanteric fracture. Initial encounter.   EXAM: DG HIP (WITH OR WITHOUT PELVIS) 2-3V RIGHT  COMPARISON:  Single view of the abdomen 12/26/2014.  FINDINGS: A hip screw and long IM nail are in place for fixation of a healed right intertrochanteric fracture. No acute fracture is identified. No notable degenerative change is identified. No focal bony lesion is seen.  IMPRESSION: No acute abnormality. Healed right intertrochanteric fracture with fixation hardware in place.   Electronically Signed   By: Drusilla Kanner M.D.   On: 02/21/2015 15:45    EKG: Independently reviewed. Vent. rate 77 BPM PR interval 149 ms QRS duration 79 ms QT/QTc 347/393 ms P-R-T axes 36 -7 49 Sinus Rhythm.  Assessment/Plan Principal Problem:   Altered mental status   UTI Work up has been benign so far. Follow up urine culture and sensitivity. Continue Rocephin.   Active Problems:   Pressure ulcer Continue local care.     Hyperlipidemia Continue Statin.    Essential hypertension Continue cozaar. Monitor blood pressure.    BPH (benign prostatic hypertrophy) Continue Proscar, Flomax    Diabetes mellitus, type 2 Continue insulin sliding scale CBG monitoring   Code Status: Full code. His son Kathlene November rescinded his DNR/DNI status.  He stated that he wants more time to discuss it with the family. DVT Prophylaxis: Lovenox SQ. Family Communication:  I spoke for about 15 minutes on the phone with son Casimiro Needle. Viana,Jacklane Spouse [phone]    [place], [place] [phone]    Disposition Plan: Admit for further evaluation and monitoring. Family would like to discuss the case with social services for possible temporary physical rehabilitation.  Time spent: Over 70 minutes.   Bobette Mo [facility] Pager [phone].

## 2015-02-22 DIAGNOSIS — E785 Hyperlipidemia, unspecified: Secondary | ICD-10-CM

## 2015-02-22 DIAGNOSIS — E118 Type 2 diabetes mellitus with unspecified complications: Secondary | ICD-10-CM

## 2015-02-22 LAB — COMPREHENSIVE METABOLIC PANEL
ALBUMIN: 3.2 g/dL — AB (ref 3.5–5.0)
ALT: 14 U/L — AB (ref 17–63)
AST: 16 U/L (ref 15–41)
Alkaline Phosphatase: 61 U/L (ref 38–126)
Anion gap: 8 (ref 5–15)
BUN: 10 mg/dL (ref 6–20)
CHLORIDE: 98 mmol/L — AB (ref 101–111)
CO2: 26 mmol/L (ref 22–32)
CREATININE: 0.81 mg/dL (ref 0.61–1.24)
Calcium: 8.6 mg/dL — ABNORMAL LOW (ref 8.9–10.3)
GFR calc non Af Amer: >60 mL/min (ref >60)
GLUCOSE: 116 mg/dL — AB (ref 65–99)
Potassium: 3.8 mmol/L (ref 3.5–5.1)
SODIUM: 132 mmol/L — AB (ref 135–145)
Total Bilirubin: 0.5 mg/dL (ref 0.3–1.2)
Total Protein: 5.8 g/dL — ABNORMAL LOW (ref 6.5–8.1)

## 2015-02-22 LAB — CBC
HCT: 31.6 % — ABNORMAL LOW (ref 39.0–52.0)
Hemoglobin: 10.8 g/dL — ABNORMAL LOW (ref 13.0–17.0)
MCH: 30.6 pg (ref 26.0–34.0)
MCHC: 34.2 g/dL (ref 30.0–36.0)
MCV: 89.5 fL (ref 78.0–100.0)
PLATELETS: 305 10*3/uL (ref 150–400)
RBC: 3.53 MIL/uL — AB (ref 4.22–5.81)
RDW: 14.9 % (ref 11.5–15.5)
WBC: 6.7 10*3/uL (ref 4.0–10.5)

## 2015-02-22 LAB — GLUCOSE, CAPILLARY
GLUCOSE-CAPILLARY: 192 mg/dL — AB (ref 65–99)
Glucose-Capillary: 116 mg/dL — ABNORMAL HIGH (ref 65–99)
Glucose-Capillary: 149 mg/dL — ABNORMAL HIGH (ref 65–99)
Glucose-Capillary: 145 mg/dL — ABNORMAL HIGH (ref 65–99)

## 2015-02-22 MED ORDER — INSULIN ASPART 100 UNIT/ML ~~LOC~~ SOLN
0.0000 [IU] | Freq: Every day | SUBCUTANEOUS | Status: DC
  Administered 2015-02-27 – 2015-03-01 (×2): 2 [IU] via SUBCUTANEOUS

## 2015-02-22 MED ORDER — INSULIN ASPART 100 UNIT/ML ~~LOC~~ SOLN
0.0000 [IU] | Freq: Three times a day (TID) | SUBCUTANEOUS | Status: DC
  Administered 2015-02-22: 2 [IU] via SUBCUTANEOUS
  Administered 2015-02-22 – 2015-02-23 (×3): 1 [IU] via SUBCUTANEOUS
  Administered 2015-02-23: 5 [IU] via SUBCUTANEOUS
  Administered 2015-02-24: 1 [IU] via SUBCUTANEOUS
  Administered 2015-02-24: 2 [IU] via SUBCUTANEOUS
  Administered 2015-02-24 – 2015-02-25 (×2): 1 [IU] via SUBCUTANEOUS
  Administered 2015-02-26 (×2): 3 [IU] via SUBCUTANEOUS
  Administered 2015-02-26 – 2015-02-27 (×2): 2 [IU] via SUBCUTANEOUS
  Administered 2015-02-27: 3 [IU] via SUBCUTANEOUS
  Administered 2015-02-27 – 2015-02-28 (×3): 2 [IU] via SUBCUTANEOUS
  Administered 2015-03-01: 3 [IU] via SUBCUTANEOUS
  Administered 2015-03-01 – 2015-03-02 (×3): 2 [IU] via SUBCUTANEOUS
  Administered 2015-03-02 – 2015-03-03 (×2): 1 [IU] via SUBCUTANEOUS
  Filled 2015-02-22 (×2): qty 1

## 2015-02-22 NOTE — Progress Notes (Signed)
[facility]                                                                              Patient Demographics  Kurt Navarro, is a 81 y.o. male, DOB - 03/06/1927, RUE:[id number]  Admit date - 02/21/2015   Admitting Physician Bobette Mo, MD  Outpatient Primary MD for the patient is Thayer Headings, MD  LOS - 1   Chief Complaint  Patient presents with  . Fall      HPI 02/21/2015 by Dr. Sanda Klein Kurt Navarro is a 81 y.o. male with a past medical history of type 2 diabetes, hypertension, hyperlipidemia, heart murmur, BPH, GERD, coronary artery disease, glaucoma who was brought in by his son to the emergency department due to progressive weakness associated with 4 falls in the last 9 days and confusion. Apparently the patient has not hit his head, he hasn't have fever or chills, but he is usually alert oriented 3 and has been unable to tell the current date even down to the year, as well as being confused with other events for the past week plus.He is he is oriented to place and is currently in no acute distress. He is unable to provide further details on his history.  Assessment & Plan   Acute encephalopathy secondary UTI -CT head: No acute intracranial abnormality -CXR: Decreased lung volumes with increased bibasilar atelectasis, questionable small right pleural effusion -UA: WBC TNTC, Large leukocytes  -Urine culture neg -Patient currently afebrile with no leukocytosis  Fall/physical deconditioning -PT consulted and appreciated, recommended SNF -Will consult social work for placement  Diabetes mellitus, type 2 -Will discontinue metformin -Continue insulin sliding scale CBG monitoring  Hyperlipidemia -Continue statin  Essential hypertension -Continue Cozaar  BPH -Continue Proscar, Flomax  Hypothyroidism -Continue Synthroid  Code Status: Full  Family Communication: None at bedside  Disposition Plan: Admitted  Time Spent in minutes   30  minutes  Procedures  None  Consults   None  DVT Prophylaxis  Lovenox  Lab Results  Component Value Date   PLT 305 02/22/2015    Medications  Scheduled Meds: . aspirin  325 mg Oral Daily  . cefTRIAXone (ROCEPHIN)  IV  1 g Intravenous Q24H  . enoxaparin (LOVENOX) injection  40 mg Subcutaneous Q24H  . ferrous sulfate  325 mg Oral BID  . finasteride  5 mg Oral Daily  . insulin aspart  0-5 Units Subcutaneous QHS  . insulin aspart  0-9 Units Subcutaneous TID WC  . latanoprost  1 drop Both Eyes QHS  . levothyroxine  50 mcg Oral QAC breakfast  . losartan  100 mg Oral Daily  . multivitamin with minerals  1 tablet Oral Daily  . pantoprazole  40 mg Oral Daily  . pravastatin  20 mg Oral QPM  . tamsulosin  0.4 mg Oral QPC supper   Continuous Infusions:  PRN Meds:.ondansetron **OR** ondansetron (ZOFRAN) IV  Antibiotics    Anti-infectives    Start     Dose/Rate Route Frequency Ordered Stop   02/22/15 1630  cefTRIAXone (ROCEPHIN) 1 g in dextrose 5 % 50 mL IVPB     1 g 100 mL/hr over 30 Minutes Intravenous Every 24 hours  02/21/15 2054     02/21/15 1630  cefTRIAXone (ROCEPHIN) 1 g in dextrose 5 % 50 mL IVPB     1 g 100 mL/hr over 30 Minutes Intravenous  Once 02/21/15 1626 02/21/15 1822      Subjective:   Kurt Navarro seen and examined today.  Patient does not know why he is here. States his family dropped him off because he has been falling. Patient currently denies any chest pain or shortness of breath, abdominal pain, dizziness or headache.   Objective:   Filed Vitals:   02/21/15 2015 02/21/15 2121 02/22/15 0521 02/22/15 1301  BP: 176/67 165/74 145/57 159/61  Pulse: 77 74 74 64  Temp: 97.6 F (36.4 C) 98 F (36.7 C) 98.3 F (36.8 C) 98.1 F (36.7 C)  TempSrc: Oral Oral Oral Oral  Resp: 16 15 17 20   Height: 5\' 5"  (1.651 m)     Weight: 65.4 kg (144 lb 2.9 oz)     SpO2: 96% 99% 94% 98%    Wt Readings from Last 3 Encounters:  02/21/15 65.4 kg (144 lb 2.9 oz)   12/12/14 66.225 kg (146 lb)  12/11/14 66.225 kg (146 lb)     Intake/Output Summary (Last 24 hours) at 02/22/15 1349 Last data filed at 02/22/15 0600  Gross per 24 hour  Intake    300 ml  Output   1250 ml  Net   -950 ml    Exam  General: Well developed, elderly, chronically ill-appearing  HEENT: NCAT, mucous membranes moist.   Cardiovascular: S1 S2 auscultated, RRR  Respiratory: Clear to auscultation bilaterally with equal chest rise  Abdomen: Soft, nontender, nondistended, + bowel sounds  Extremities: warm dry without cyanosis clubbing or edema  Neuro: AAO x2, self and place.  Nonfocal.  Skin: Without rashes exudates or nodules, bruising and ecchymosis noted on the extremities.  Psych: Pleasant  Data Review   Micro Results No results found for this or any previous visit (from the past 240 hour(s)).  Radiology Reports Dg Chest 2 View  02/21/2015   CLINICAL DATA:  Multiple falls yesterday, increased confusion, incontinence, decreased appetite, history hypertension, diabetes mellitus, coronary artery disease, former smoker  EXAM: CHEST  2 VIEW  COMPARISON:  02/13/2015  FINDINGS: Low lung volumes.  Upper normal heart size.  Atherosclerotic calcification aorta.  Pulmonary vascular congestion.  Bibasilar atelectasis, increased.  Apices clear.  Questionable small RIGHT pleural effusion.  No pneumothorax.  Bones demineralized.  IMPRESSION: Decreased lung volumes with increased bibasilar atelectasis and questionable small RIGHT pleural effusion.   Electronically Signed   By: Ulyses Southward M.D.   On: 02/21/2015 18:17   Dg Chest 2 View  02/05/2015   CLINICAL DATA:  Larey Seat. Week and soreness all over. Tenderness in the back and right shoulder area.  EXAM: CHEST  2 VIEW  COMPARISON:  04/28/2014  FINDINGS: There are old right rib fractures. Age-indeterminate fractures involving the left fifth and sixth ribs. Densities at the left lung base could represent atelectasis or scarring. Heart  size is grossly normal with atherosclerotic calcifications at the aorta. Negative for a pneumothorax. Again noted is a compression fracture in the upper lumbar spine.  IMPRESSION: Low lung volumes bilaterally.  Bilateral rib fractures. Left rib fractures are age indeterminate and new from the prior examination.   Electronically Signed   By: Richarda Overlie M.D.   On: 02/05/2015 07:55   Dg Lumbar Spine Complete  02/05/2015   CLINICAL DATA:  Injury, fall in the bathroom  EXAM: LUMBAR SPINE - COMPLETE 4+ VIEW  COMPARISON:  01/03/2015  FINDINGS: Five views of the lumbar spine submitted. There is diffuse osteopenia. Atherosclerotic calcifications of abdominal aorta and iliac arteries. Again noted compression deformity upper endplate of L1 vertebral body is stable from prior exam. There is disc space flattening with mild anterior spurring at T12-L1 and L1-L2 level. Mild anterior spurring upper endplate of L3 and L4 vertebral body. Minimal disc space flattening at L5-S1 level. There is mild lower lumbar levoscoliosis.  IMPRESSION: No acute fracture or subluxation. Mild lower lumbar levoscoliosis. Degenerative changes as described above. Stable compression fracture upper endplate of L1 vertebral body. Diffuse osteopenia.   Electronically Signed   By: Natasha Mead M.D.   On: 02/05/2015 08:05   Dg Shoulder Right  02/05/2015   CLINICAL DATA:  Larey Seat with weakness. Tenderness in the back and right shoulder area.  EXAM: RIGHT SHOULDER - 2+ VIEW  COMPARISON:  04/28/2014  FINDINGS: There are old right rib fractures. The right shoulder is located without a fracture. Right AC joint is intact. Evidence for spurring and degenerative changes at the right glenohumeral joint.  IMPRESSION: No acute bone abnormality in the right shoulder.   Electronically Signed   By: Richarda Overlie M.D.   On: 02/05/2015 08:00   Ct Head Wo Contrast  02/21/2015   CLINICAL DATA:  Multiple falls with 2 yesterday. Confusion. Incontinence. Decreased appetite.   EXAM: CT HEAD WITHOUT CONTRAST  TECHNIQUE: Contiguous axial images were obtained from the base of the skull through the vertex without intravenous contrast.  COMPARISON:  None  FINDINGS: Sinuses/Soft tissues: Surgical changes about the globes. Mucosal thickening of the left maxillary sinus. No skull fracture. Clear mastoid air cells.  Intracranial: Expected cerebral atrophy. Moderate low density in the periventricular white matter likely related to small vessel disease. No mass lesion, hemorrhage, hydrocephalus, acute infarct, intra-axial, or extra-axial fluid collection.  IMPRESSION: 1.  No acute intracranial abnormality. 2.  Cerebral atrophy and small vessel ischemic change. 3. Mild sinus disease.   Electronically Signed   By: Jeronimo Greaves M.D.   On: 02/21/2015 17:56   Ct Head Wo Contrast  02/05/2015   CLINICAL DATA:  Fall.  Hit head.  EXAM: CT HEAD WITHOUT CONTRAST  TECHNIQUE: Contiguous axial images were obtained from the base of the skull through the vertex without intravenous contrast.  COMPARISON:  None.  FINDINGS: There is diffuse low attenuation throughout the subcortical and periventricular white matter compatible with chronic microvascular disease. Prominence of the sulci and the ventricles noted consistent with brain atrophy. No abnormal extra-axial fluid collections, intracranial hemorrhage or mass noted. There is mild mucosal thickening involving the maxillary sinuses. The paranasal sinuses are clear. The mastoid air cells appear clear. The calvarium is intact.  IMPRESSION: 1. No acute intracranial abnormality. 2. Chronic microvascular disease and brain atrophy.   Electronically Signed   By: Signa Kell M.D.   On: 02/05/2015 08:26   Ct Chest W Contrast  02/05/2015   CLINICAL DATA:  Fall.  Right rib pain  EXAM: CT CHEST WITH CONTRAST  TECHNIQUE: Multidetector CT imaging of the chest was performed during intravenous contrast administration.  CONTRAST:  OMNIPAQUE IOHEXOL 300 MG/ML  SOLN   COMPARISON:  Chest x-ray 02/05/2015.  No prior chest CT for review  FINDINGS: Left upper lobe mass lesion measures 3.5 x 11.6 cm. This has spiculated margins and is worrisome for carcinoma. Additional spiculated mass in the superior segment left lower lobe adjacent to the  major fissure measures approximately 23 x 13 mm. These lesions are difficult to see on chest x-ray due to their location. Both lesions are worrisome for carcinoma. No mediastinal adenopathy.  Chronic lung disease with probable interstitial scarring on the right.  Right lower lobe airspace disease may represent atelectasis or pneumonia. Small right effusion. Mild left lower lobe atelectasis.  Coronary artery calcification. Heart size normal. Atherosclerotic calcification aortic arch without aneurysm or dissection. No central pulmonary embolus.  Chronic right rib fractures with deformity. No acute right rib fracture. Left rib fractures appear subacute with callus formation. No acute left rib fracture.  Negative for mass in the upper abdomen.  Mild compression fracture at T11 of indeterminate age.  IMPRESSION: Spiculated mass left upper lobe and superior segment left lower lobe. These findings are concerning for carcinoma of the lung. Pneumonia and fibrosis considered less likely. PET-CT recommended for further evaluation.  Probable chronic lung disease with interstitial scarring on the right. Right lower lobe atelectasis/ infiltrate. Small right effusion. Pneumonia not excluded on the right.  Bilateral rib fractures appear subacute to chronic. No acute rib fracture.  Mild compression fracture T11, possibly acute.  These results were called by telephone at the time of interpretation on 02/05/2015 at 11:18 am to [facility] , who verbally acknowledged these results.   Electronically Signed   By: Marlan Palau M.D.   On: 02/05/2015 11:19   Ct Cervical Spine Wo Contrast  02/05/2015   CLINICAL DATA:  81 year old with recent fall. Head laceration.  Complains of pain in the upper back.  EXAM: CT CERVICAL SPINE WITHOUT CONTRAST  TECHNIQUE: Multidetector CT imaging of the cervical spine was performed without intravenous contrast. Multiplanar CT image reconstructions were also generated.  COMPARISON:  01/25/2008  FINDINGS: Negative for an acute fracture dislocation in the cervical spine. There is mild sclerosis and deformity of the T3 superior endplate which appears new since 2009. There is mild retrolisthesis at C3-C4 with disc space narrowing. Bilateral facet arthropathy. No significant soft tissue edema. No significant neck lymphadenopathy.  Images of the upper lungs demonstrate a 2.7 x 1.1 cm opacity in the medial left upper lobe. This opacity contains some scattered calcifications. There are additional patchy opacities and calcifications in the right upper lung. There is also a small focal opacity in the medial superior segment of the left lower lobe measuring up to 1.5 cm.  IMPRESSION: Degenerative changes in cervical spine without acute bone abnormality.  Mild compression deformity of the T3 vertebral body of unknown age. Legrand Rams an old injury based on the sclerosis but recommend clinical correlation.  There are focal parenchymal densities or lesions in the left upper lung, particularly in the left upper lobe. These appear to be new since 2009. Findings are nonspecific but a neoplastic process cannot be excluded. Recommend further characterization with a post contrast chest CT.  These results were called by telephone at the time of interpretation on 02/05/2015 at 8:42 am to [facility], PA-C, who verbally acknowledged these results.   Electronically Signed   By: Richarda Overlie M.D.   On: 02/05/2015 08:52   Dg Hip Unilat  With Pelvis 2-3 Views Right  02/21/2015   CLINICAL DATA:  Multiple falls over the past 10 days. Right hip pain and bruising. History of prior right intertrochanteric fracture. Initial encounter.  EXAM: DG HIP (WITH OR WITHOUT PELVIS) 2-3V  RIGHT  COMPARISON:  Single view of the abdomen 12/26/2014.  FINDINGS: A hip screw and long IM nail are in  place for fixation of a healed right intertrochanteric fracture. No acute fracture is identified. No notable degenerative change is identified. No focal bony lesion is seen.  IMPRESSION: No acute abnormality. Healed right intertrochanteric fracture with fixation hardware in place.   Electronically Signed   By: Drusilla Kanner M.D.   On: 02/21/2015 15:45    CBC  Recent Labs Lab 02/21/15 1719 02/22/15 0531  WBC 7.7 6.7  HGB 11.7* 10.8*  HCT 34.3* 31.6*  PLT 314 305  MCV 89.8 89.5  MCH 30.6 30.6  MCHC 34.1 34.2  RDW 14.8 14.9  LYMPHSABS 0.9  --   MONOABS 0.7  --   EOSABS 0.1  --   BASOSABS 0.0  --     Chemistries   Recent Labs Lab 02/21/15 1719 02/22/15 0531  NA 132* 132*  K 3.9 3.8  CL 96* 98*  CO2 27 26  GLUCOSE 107* 116*  BUN 15 10  CREATININE 0.77 0.81  CALCIUM 8.9 8.6*  AST 19 16  ALT 15* 14*  ALKPHOS 66 61  BILITOT 0.5 0.5   ------------------------------------------------------------------------------------------------------------------ estimated creatinine clearance is 56.9 mL/min (by C-G formula based on Cr of 0.81). ------------------------------------------------------------------------------------------------------------------ No results for input(s): HGBA1C in the last 72 hours. ------------------------------------------------------------------------------------------------------------------ No results for input(s): CHOL, HDL, LDLCALC, TRIG, CHOLHDL, LDLDIRECT in the last 72 hours. ------------------------------------------------------------------------------------------------------------------ No results for input(s): TSH, T4TOTAL, T3FREE, THYROIDAB in the last 72 hours.  Invalid input(s): FREET3 ------------------------------------------------------------------------------------------------------------------ No results for input(s): VITAMINB12,  FOLATE, FERRITIN, TIBC, IRON, RETICCTPCT in the last 72 hours.  Coagulation profile No results for input(s): INR, PROTIME in the last 168 hours.  No results for input(s): DDIMER in the last 72 hours.  Cardiac Enzymes No results for input(s): CKMB, TROPONINI, MYOGLOBIN in the last 168 hours.  Invalid input(s): CK ------------------------------------------------------------------------------------------------------------------ Invalid input(s): POCBNP    Chana Lindstrom D.O. on 02/22/2015 at 1:49 PM  Between 7am to 7pm - Pager - [phone]  After 7pm go to [email or web address] - password [email or web address]  And look for the night coverage person covering for me after hours  [facility] Office  [phone]

## 2015-02-22 NOTE — Evaluation (Signed)
Physical Therapy Evaluation Patient Details Name: Kurt Navarro MRN: [id number] DOB: December 28, 1926 Today's Date: 02/22/2015   History of Present Illness  Patient is a 81 y/o male with hx of type 2 diabetes, HTN, HLD, heart murmur, BPH, GERD, CAD, glaucoma who was brought in by his son to the ED due to progressive weakness associated with 4 falls in the last 9 days and confusion. CXR-Decreased lung volumes with increased bibasilar atelectasis and questionable small RIGHT pleural effusion.  Clinical Impression  Patient presents with confusion, weakness and balance deficits impacting safe mobility. Pt unreliable historian and not able to provide accurate PLOF/history. Per MD notes, pt with multiple falls at home more recently. Pt with bruises throughout. Requires Min-Mod A for transfers/ambulation. Pt continues to be high fall risk and not safe to return home at this time. Recommend ST SNF. Will follow acutely to maximize independence and mobility and decrease fall risk prior to discharge.     Follow Up Recommendations SNF;Supervision/Assistance - 24 hour    Equipment Recommendations  Other (comment) (TBD.)    Recommendations for Other Services OT consult     Precautions / Restrictions Precautions Precautions: Fall Restrictions Weight Bearing Restrictions: No      Mobility  Bed Mobility Overal bed mobility: Needs Assistance Bed Mobility: Supine to Sit     Supine to sit: HOB elevated;Mod assist     General bed mobility comments: Min A to elevate trunk and scoot bottom to EOB. Pt with posterior trunk lean. Pt found incontinent of stool. Use of rail for support.   Transfers Overall transfer level: Needs assistance Equipment used: Rolling walker (2 wheeled) Transfers: Sit to/from Stand Sit to Stand: Min assist         General transfer comment: Min A to boost from EOB with cues for hand placement/technique. Stood from [employer]. Transferred to chair post ambulation  bout.  Ambulation/Gait Ambulation/Gait assistance: Min assist Ambulation Distance (Feet): 20 Feet Assistive device: Rolling walker (2 wheeled) Gait Pattern/deviations: Step-to pattern;Decreased stride length;Decreased dorsiflexion - left;Trunk flexed;Narrow base of support;Shuffle   Gait velocity interpretation: <1.8 ft/sec, indicative of risk for recurrent falls General Gait Details: Pt with decreased foot clearance LLE - shuffling it at times. Assist with RW navigation and for balance. POor safety awareness.  Stairs            Wheelchair Mobility    Modified Rankin (Stroke Patients Only)       Balance Overall balance assessment: Needs assistance Sitting-balance support: Feet supported;Bilateral upper extremity supported Sitting balance-Leahy Scale: Poor Sitting balance - Comments: Posterior lean requiring MIn A initially progressing to min guard.  Postural control: Posterior lean Standing balance support: During functional activity;Bilateral upper extremity supported Standing balance-Leahy Scale: Poor Standing balance comment: Able to stand for pericare with UE support and Min A.                              Pertinent Vitals/Pain Pain Assessment: No/denies pain    Home Living Family/patient expects to be discharged to:: Skilled nursing facility Living Arrangements: Spouse/significant other Available Help at Discharge: Family Type of Home: House Home Access: Stairs to enter   [employer] of Steps: 2 Home Layout: One level Home Equipment: Walker - 2 wheels Additional Comments: Pt confused and not a reliable historian. No family members present to obtain current PLOF/social history.     Prior Function Level of Independence: Independent with assistive device(s)  Comments: Pt reports ambulating with RW PTA. Reports multiple falls. Says, "my physical therapist is Belenda Cruise, she will be working with me."     Hand Dominance         Extremity/Trunk Assessment   Upper Extremity Assessment: Defer to OT evaluation           Lower Extremity Assessment: Generalized weakness         Communication   Communication: No difficulties  Cognition Arousal/Alertness: Awake/alert Behavior During Therapy: WFL for tasks assessed/performed;Impulsive Overall Cognitive Status: Impaired/Different from baseline Area of Impairment: Safety/judgement;Problem solving;Memory     Memory: Decreased short-term memory   Safety/Judgement: Decreased awareness of safety;Decreased awareness of deficits   Problem Solving: Slow processing;Requires verbal cues      General Comments      Exercises        Assessment/Plan    PT Assessment Patient needs continued PT services  PT Diagnosis Difficulty walking;Generalized weakness   PT Problem List Decreased strength;Decreased cognition;Decreased activity tolerance;Decreased balance;Decreased mobility;Decreased safety awareness;Decreased knowledge of use of DME  PT Treatment Interventions Balance training;Gait training;Functional mobility training;Therapeutic activities;Therapeutic exercise;Patient/family education   PT Goals (Current goals can be found in the Care Plan section) Acute Rehab PT Goals Patient Stated Goal: to get out of bed PT Goal Formulation: With patient Time For Goal Achievement: 03/08/15 Potential to Achieve Goals: Fair    Frequency Min 2X/week   Barriers to discharge   Not sure of level of support at home.    Co-evaluation               End of Session Equipment Utilized During Treatment: Gait belt Activity Tolerance: Patient tolerated treatment well Patient left: in chair;with call bell/phone within reach;with chair alarm set Nurse Communication: Mobility status;Precautions         Time: 1610-9604 PT Time Calculation (min) (ACUTE ONLY): 20 min   Charges:   PT Evaluation $Initial PT Evaluation Tier I: 1 Procedure     PT G Codes:         Gilverto Dileonardo A Emonni Depasquale 02/22/2015, 1:16 PM  Mylo Red, PT, DPT [phone]

## 2015-02-23 ENCOUNTER — Inpatient Hospital Stay (HOSPITAL_COMMUNITY): Payer: Medicare Other

## 2015-02-23 LAB — BASIC METABOLIC PANEL
Anion gap: 8 (ref 5–15)
BUN: 8 mg/dL (ref 6–20)
CO2: 29 mmol/L (ref 22–32)
Calcium: 9.3 mg/dL (ref 8.9–10.3)
Chloride: 95 mmol/L — ABNORMAL LOW (ref 101–111)
Creatinine, Ser: 0.87 mg/dL (ref 0.61–1.24)
Glucose, Bld: 137 mg/dL — ABNORMAL HIGH (ref 65–99)
POTASSIUM: 4.1 mmol/L (ref 3.5–5.1)
SODIUM: 132 mmol/L — AB (ref 135–145)

## 2015-02-23 LAB — GLUCOSE, CAPILLARY
Glucose-Capillary: 138 mg/dL — ABNORMAL HIGH (ref 65–99)
Glucose-Capillary: 134 mg/dL — ABNORMAL HIGH (ref 65–99)
Glucose-Capillary: 136 mg/dL — ABNORMAL HIGH (ref 65–99)
Glucose-Capillary: 251 mg/dL — ABNORMAL HIGH (ref 65–99)
Glucose-Capillary: 116 mg/dL — ABNORMAL HIGH (ref 65–99)
Glucose-Capillary: 134 mg/dL — ABNORMAL HIGH (ref 65–99)

## 2015-02-23 LAB — CBC
HCT: 33.9 % — ABNORMAL LOW (ref 39.0–52.0)
HEMOGLOBIN: 11.6 g/dL — AB (ref 13.0–17.0)
MCH: 30.9 pg (ref 26.0–34.0)
MCHC: 34.2 g/dL (ref 30.0–36.0)
MCV: 90.4 fL (ref 78.0–100.0)
PLATELETS: 334 10*3/uL (ref 150–400)
RBC: 3.75 MIL/uL — AB (ref 4.22–5.81)
RDW: 14.5 % (ref 11.5–15.5)
WBC: 8.4 10*3/uL (ref 4.0–10.5)

## 2015-02-23 MED ORDER — VANCOMYCIN HCL 10 G IV SOLR
1250.0000 mg | INTRAVENOUS | Status: DC
  Administered 2015-02-23 – 2015-02-26 (×4): 1250 mg via INTRAVENOUS
  Filled 2015-02-23 (×9): qty 1250

## 2015-02-23 NOTE — Clinical Social Work Note (Signed)
Clinical Social Work Assessment  Patient Details  Name: Kurt Navarro MRN: [id number] Date of Birth: [birth year]  Date of referral:  02/22/15               Reason for consult:  Facility Placement                Permission sought to share information with:  Family Supports Permission granted to share information::  Yes, Verbal Permission Granted  Name::        Agency::     Relationship::     Contact Information:     Housing/Transportation Living arrangements for the past 2 months:  Single Family Home Source of Information:  Patient, Adult Children, Spouse Patient Interpreter Needed:  None Criminal Activity/Legal Involvement Pertinent to Current Situation/Hospitalization:  No - Comment as needed Significant Relationships:  Adult Children Lives with:  Spouse Do you feel safe going back to the place where you live?  Yes Need for family participation in patient care:  Yes (Comment)  Care giving concerns: CSW met with patient while wife, son and daughter were present in the room. Family participated in treatment plan but patient mad clear his options and choices.    Social Worker assessment / plan:  CSW provided family with a list of facilities. Family stated that because patient is in [place] they prefer [facility] as 1st choice and [facility] as 2nd choice. Family stated they would be okay faxing to all of [facility] facilities.   Employment status:  Retired Forensic scientist:  Medicare PT Recommendations:  [place] / Referral to community resources:  [place]  Patient/Family's Response to care: Patient and family were very appreciative of support and discharge plan.   Patient/Family's Understanding of and Emotional Response to Diagnosis, Current Treatment, and Prognosis: Patient and family looking for short term rehab to restore patient to baseline health.   Emotional Assessment Appearance:  Appears stated  age Attitude/Demeanor/Rapport:   (appropriate and pleasant) Affect (typically observed):  Appropriate, Accepting Orientation:  Oriented to Self, Oriented to Place, Oriented to  Time, Oriented to Situation Alcohol / Substance use:  Not Applicable Psych involvement (Current and /or in the community):  No (Comment)  Discharge Needs  Concerns to be addressed:  Discharge Planning Concerns (Patient arrived on Friday and at some point was admited as OBS and then changed to [place]. Be aware that Medicare requires 3 consecutive INPT days of treatment to approve placement. ) Readmission within the last 30 days:  No Current discharge risk:  None Barriers to Discharge:  No Barriers Identified   Christene Lye, LCSW 02/23/2015, 5:19 PM

## 2015-02-23 NOTE — Progress Notes (Signed)
Pt appears more drowsy than usual, crackles on auscultation (RUL) . Pt has increased secretions  and is encouraged for deep breathing and coughing. Pt had low grade fever earlier(100). On-call provider Merdis Delay, NP notified via text-page. awaiting orders. 2323- order received for STAT -CXR , will cont to monitor.

## 2015-02-23 NOTE — Progress Notes (Signed)
OT Cancellation Note  Patient Details Name: Jourdain Guay MRN: [id number] DOB: 11/06/1926   Cancelled Treatment:    Reason Eval/Treat Not Completed:  Social worker standing at room and went in to talk with pt.   Earlie Raveling OTR/L [phone]  02/23/2015, 4:45 PM

## 2015-02-23 NOTE — Progress Notes (Addendum)
ANTIBIOTIC CONSULT NOTE - INITIAL  Pharmacy Consult for vancomycin and Zosyn Indication: UTI  No Known Allergies  Patient Measurements: Height:  (165.1 cm) Weight: 144 lb 2.9 oz (65.4 kg) IBW/kg (Calculated) : 61.5  Vital Signs: Temp: 98.6 F (37 C) (08/28 1249) Temp Source: Oral (08/28 1249) BP: 176/83 mmHg (08/28 1249) Pulse Rate: 79 (08/28 1249) Intake/Output from previous day: 08/27 0701 - 08/28 0700 In: 748 [P.O.:698; IV Piggyback:50] Out: 3500 [Urine:3500] Intake/Output from this shift: Total I/O In: 556 [P.O.:556] Out: 1575 [Urine:1575]  Labs:  Recent Labs  02/21/15 1719 02/22/15 0531 02/23/15 0600  WBC 7.7 6.7 8.4  HGB 11.7* 10.8* 11.6*  PLT 314 305 334  CREATININE 0.77 0.81 0.87   Estimated Creatinine Clearance: 53 mL/min (by C-G formula based on Cr of 0.87). No results for input(s): VANCOTROUGH, VANCOPEAK, VANCORANDOM, GENTTROUGH, GENTPEAK, GENTRANDOM, TOBRATROUGH, TOBRAPEAK, TOBRARND, AMIKACINPEAK, AMIKACINTROU, AMIKACIN in the last 72 hours.   Microbiology: Recent Results (from the past 720 hour(s))  Urine culture     Status: None (Preliminary result)   Collection Time: 02/21/15  5:10 PM  Result Value Ref Range Status   Specimen Description URINE, CLEAN CATCH  Final   Special Requests Immunocompromised  Final   Culture >=100,000 COLONIES/mL ENTEROCOCCUS SPECIES  Final   Report Status PENDING  Incomplete   Organism ID, Bacteria ENTEROCOCCUS SPECIES  Final      Susceptibility   Enterococcus species - MIC*    AMPICILLIN <=2 RESISTANT Resistant     LEVOFLOXACIN >=8 RESISTANT Resistant     NITROFURANTOIN <=16 SENSITIVE Sensitive     VANCOMYCIN 1 SENSITIVE Sensitive     * >=100,000 COLONIES/mL ENTEROCOCCUS SPECIES    Medical History: Past Medical History  Diagnosis Date  . Diabetes mellitus without complication   . Hypertension   . Hypercholesteremia   . Heart murmur   . Frequent urination   . BPH (benign prostatic hyperplasia)   . GERD  (gastroesophageal reflux disease)   . Coronary artery disease     medical management with aspirin  . Glaucoma     Assessment: 81 yo with enterococcal UTI to begin vancomycin.  WBC= 8.4, afeb, SCr= 0.87 and CrCl ~ 50. Urine cultures show resistance to ampicillin however MIC < 2 (lab in re-running results and should be available 8/29).   8/28 vancomycin 8/26 rocephin >> 8/27   Goal of Therapy:  Vancomycin trough level 10-15 mcg/ml  Plan:  -Vancomycin  IV q24h -Will follow renal function, cultures and clinical progress  Harland German, Pharm D 02/23/2015 3:55 PM     ADDENDUM: To broaden IV ABX.  Will start Zosyn 3.375g IV Q8H and monitor CBC and Cx. Vernard Gambles, PharmD, BCPS 02/24/2015 12:28 AM

## 2015-02-23 NOTE — Clinical Social Work Placement (Signed)
   CLINICAL SOCIAL WORK PLACEMENT  NOTE  Date:  02/23/2015  Patient Details  Name: Kurt Navarro MRN: [id number] Date of Birth: October 23, 1926  Clinical Social Work is seeking post-discharge placement for this patient at the Skilled  Nursing Facility level of care (*CSW will initial, date and re-position this form in  chart as items are completed):  Yes   Patient/family provided with [facility] Clinical Social Work Department's list of facilities offering this level of care within the geographic area requested by the patient (or if unable, by the patient's family).  Yes   Patient/family informed of their freedom to choose among providers that offer the needed level of care, that participate in Medicare, Medicaid or managed care program needed by the patient, have an available bed and are willing to accept the patient.  No   Patient/family informed of [facility]'s ownership interest in [facility] and [facility], as well as of the fact that they are under no obligation to receive care at these facilities.  PASRR submitted to EDS on       PASRR number received on       Existing PASRR number confirmed on 02/23/15     FL2 transmitted to all facilities in geographic area requested by pt/family on 02/23/15     FL2 transmitted to all facilities within larger geographic area on 02/23/15     Patient informed that his/her managed care company has contracts with or will negotiate with certain facilities, including the following:            Patient/family informed of bed offers received.  Patient chooses bed at       Physician recommends and patient chooses bed at      Patient to be transferred to   on  .  Patient to be transferred to facility by       Patient family notified on   of transfer.  Name of family member notified:        PHYSICIAN       Additional Comment:    _______________________________________________ Beverly Sessions, LCSW 02/23/2015, 5:25 PM

## 2015-02-23 NOTE — Progress Notes (Signed)
[facility]                                                                              Patient Demographics  Kurt Navarro, is a 81 y.o. male, DOB - 21-Dec-1926, ZOX:[id number]  Admit date - 02/21/2015   Admitting Physician Bobette Mo, MD  Outpatient Primary MD for the patient is Thayer Headings, MD  LOS - 2   Chief Complaint  Patient presents with  . Fall      HPI 02/21/2015 by Dr. Sanda Klein Kurt Navarro is a 81 y.o. male with a past medical history of type 2 diabetes, hypertension, hyperlipidemia, heart murmur, BPH, GERD, coronary artery disease, glaucoma who was brought in by his son to the emergency department due to progressive weakness associated with 4 falls in the last 9 days and confusion. Apparently the patient has not hit his head, he hasn't have fever or chills, but he is usually alert oriented 3 and has been unable to tell the current date even down to the year, as well as being confused with other events for the past week plus.He is he is oriented to place and is currently in no acute distress. He is unable to provide further details on his history.  Assessment & Plan   Acute encephalopathy secondary UTI -CT head: No acute intracranial abnormality -CXR: Decreased lung volumes with increased bibasilar atelectasis, questionable small right pleural effusion -UA: WBC TNTC, Large leukocytes  -Urine culture >100K GPC -Patient currently afebrile with no leukocytosis  Fall/physical deconditioning -PT consulted and appreciated, recommended SNF -social work consulted for placement  Diabetes mellitus, type 2 -Metformin held -Continue insulin sliding scale CBG monitoring  Hyperlipidemia -Continue statin  Essential hypertension -Continue Cozaar  BPH -Continue Proscar, Flomax  Hypothyroidism -Continue Synthroid  Code Status: Full  Family Communication: None at bedside  Disposition Plan: Admitted.  Pending SNF and urine culture  sensitivities  Time Spent in minutes   30 minutes  Procedures  None  Consults   None  DVT Prophylaxis  Lovenox  Lab Results  Component Value Date   PLT 334 02/23/2015    Medications  Scheduled Meds: . aspirin  325 mg Oral Daily  . cefTRIAXone (ROCEPHIN)  IV  1 g Intravenous Q24H  . enoxaparin (LOVENOX) injection  40 mg Subcutaneous Q24H  . ferrous sulfate  325 mg Oral BID  . finasteride  5 mg Oral Daily  . insulin aspart  0-5 Units Subcutaneous QHS  . insulin aspart  0-9 Units Subcutaneous TID WC  . latanoprost  1 drop Both Eyes QHS  . levothyroxine  50 mcg Oral QAC breakfast  . losartan  100 mg Oral Daily  . multivitamin with minerals  1 tablet Oral Daily  . pantoprazole  40 mg Oral Daily  . pravastatin  20 mg Oral QPM  . tamsulosin  0.4 mg Oral QPC supper   Continuous Infusions:  PRN Meds:.ondansetron **OR** ondansetron (ZOFRAN) IV  Antibiotics    Anti-infectives    Start     Dose/Rate Route Frequency Ordered Stop   02/22/15 1630  cefTRIAXone (ROCEPHIN) 1 g in dextrose 5 % 50 mL IVPB     1 g 100 mL/hr over  30 Minutes Intravenous Every 24 hours 02/21/15 2054     02/21/15 1630  cefTRIAXone (ROCEPHIN) 1 g in dextrose 5 % 50 mL IVPB     1 g 100 mL/hr over 30 Minutes Intravenous  Once 02/21/15 1626 02/21/15 1822      Subjective:   Kurt Navarro seen and examined today.  Patient states he feels fine.  Denies any chest pain or shortness of breath, abdominal pain, dizziness or headache.    Objective:   Filed Vitals:   02/21/15 2121 02/22/15 0521 02/22/15 1301 02/22/15 2137  BP: 165/74 145/57 159/61 151/75  Pulse: 74 74 64 67  Temp: 98 F (36.7 C) 98.3 F (36.8 C) 98.1 F (36.7 C) 98.4 F (36.9 C)  TempSrc: Oral Oral Oral Oral  Resp: 15 17 20 18   Height:      Weight:      SpO2: 99% 94% 98% 97%    Wt Readings from Last 3 Encounters:  02/21/15 65.4 kg (144 lb 2.9 oz)  12/12/14 66.225 kg (146 lb)  12/11/14 66.225 kg (146 lb)     Intake/Output  Summary (Last 24 hours) at 02/23/15 1131 Last data filed at 02/23/15 0730  Gross per 24 hour  Intake    470 ml  Output   4375 ml  Net  -3905 ml    Exam  General: Well developed, elderly, chronically ill-appearing  HEENT: NCAT, mucous membranes moist.   Cardiovascular: S1 S2 auscultated, RRR  Respiratory: Clear to auscultation   Abdomen: Soft, nontender, nondistended, + bowel sounds  Extremities: warm dry without cyanosis clubbing or edema  Neuro: AAO x2, self and place.  Nonfocal.  Skin: Without rashes exudates or nodules, bruising and ecchymosis noted on the extremities.  Psych: Pleasant, appropriate  Data Review   Micro Results Recent Results (from the past 240 hour(s))  Urine culture     Status: None (Preliminary result)   Collection Time: 02/21/15  5:10 PM  Result Value Ref Range Status   Specimen Description URINE, CLEAN CATCH  Final   Special Requests Immunocompromised  Final   Culture >=100,000 COLONIES/mL GRAM POSITIVE COCCI  Final   Report Status PENDING  Incomplete    Radiology Reports Dg Chest 2 View  02/21/2015   CLINICAL DATA:  Multiple falls yesterday, increased confusion, incontinence, decreased appetite, history hypertension, diabetes mellitus, coronary artery disease, former smoker  EXAM: CHEST  2 VIEW  COMPARISON:  02/13/2015  FINDINGS: Low lung volumes.  Upper normal heart size.  Atherosclerotic calcification aorta.  Pulmonary vascular congestion.  Bibasilar atelectasis, increased.  Apices clear.  Questionable small RIGHT pleural effusion.  No pneumothorax.  Bones demineralized.  IMPRESSION: Decreased lung volumes with increased bibasilar atelectasis and questionable small RIGHT pleural effusion.   Electronically Signed   By: Ulyses Southward M.D.   On: 02/21/2015 18:17   Dg Chest 2 View  02/05/2015   CLINICAL DATA:  Larey Seat. Week and soreness all over. Tenderness in the back and right shoulder area.  EXAM: CHEST  2 VIEW  COMPARISON:  04/28/2014  FINDINGS:  There are old right rib fractures. Age-indeterminate fractures involving the left fifth and sixth ribs. Densities at the left lung base could represent atelectasis or scarring. Heart size is grossly normal with atherosclerotic calcifications at the aorta. Negative for a pneumothorax. Again noted is a compression fracture in the upper lumbar spine.  IMPRESSION: Low lung volumes bilaterally.  Bilateral rib fractures. Left rib fractures are age indeterminate and new from the prior examination.  Electronically Signed   By: Richarda Overlie M.D.   On: 02/05/2015 07:55   Dg Lumbar Spine Complete  02/05/2015   CLINICAL DATA:  Injury, fall in the bathroom  EXAM: LUMBAR SPINE - COMPLETE 4+ VIEW  COMPARISON:  01/03/2015  FINDINGS: Five views of the lumbar spine submitted. There is diffuse osteopenia. Atherosclerotic calcifications of abdominal aorta and iliac arteries. Again noted compression deformity upper endplate of L1 vertebral body is stable from prior exam. There is disc space flattening with mild anterior spurring at T12-L1 and L1-L2 level. Mild anterior spurring upper endplate of L3 and L4 vertebral body. Minimal disc space flattening at L5-S1 level. There is mild lower lumbar levoscoliosis.  IMPRESSION: No acute fracture or subluxation. Mild lower lumbar levoscoliosis. Degenerative changes as described above. Stable compression fracture upper endplate of L1 vertebral body. Diffuse osteopenia.   Electronically Signed   By: Natasha Mead M.D.   On: 02/05/2015 08:05   Dg Shoulder Right  02/05/2015   CLINICAL DATA:  Larey Seat with weakness. Tenderness in the back and right shoulder area.  EXAM: RIGHT SHOULDER - 2+ VIEW  COMPARISON:  04/28/2014  FINDINGS: There are old right rib fractures. The right shoulder is located without a fracture. Right AC joint is intact. Evidence for spurring and degenerative changes at the right glenohumeral joint.  IMPRESSION: No acute bone abnormality in the right shoulder.   Electronically Signed    By: Richarda Overlie M.D.   On: 02/05/2015 08:00   Ct Head Wo Contrast  02/21/2015   CLINICAL DATA:  Multiple falls with 2 yesterday. Confusion. Incontinence. Decreased appetite.  EXAM: CT HEAD WITHOUT CONTRAST  TECHNIQUE: Contiguous axial images were obtained from the base of the skull through the vertex without intravenous contrast.  COMPARISON:  None  FINDINGS: Sinuses/Soft tissues: Surgical changes about the globes. Mucosal thickening of the left maxillary sinus. No skull fracture. Clear mastoid air cells.  Intracranial: Expected cerebral atrophy. Moderate low density in the periventricular white matter likely related to small vessel disease. No mass lesion, hemorrhage, hydrocephalus, acute infarct, intra-axial, or extra-axial fluid collection.  IMPRESSION: 1.  No acute intracranial abnormality. 2.  Cerebral atrophy and small vessel ischemic change. 3. Mild sinus disease.   Electronically Signed   By: Jeronimo Greaves M.D.   On: 02/21/2015 17:56   Ct Head Wo Contrast  02/05/2015   CLINICAL DATA:  Fall.  Hit head.  EXAM: CT HEAD WITHOUT CONTRAST  TECHNIQUE: Contiguous axial images were obtained from the base of the skull through the vertex without intravenous contrast.  COMPARISON:  None.  FINDINGS: There is diffuse low attenuation throughout the subcortical and periventricular white matter compatible with chronic microvascular disease. Prominence of the sulci and the ventricles noted consistent with brain atrophy. No abnormal extra-axial fluid collections, intracranial hemorrhage or mass noted. There is mild mucosal thickening involving the maxillary sinuses. The paranasal sinuses are clear. The mastoid air cells appear clear. The calvarium is intact.  IMPRESSION: 1. No acute intracranial abnormality. 2. Chronic microvascular disease and brain atrophy.   Electronically Signed   By: Signa Kell M.D.   On: 02/05/2015 08:26   Ct Chest W Contrast  02/05/2015   CLINICAL DATA:  Fall.  Right rib pain  EXAM: CT CHEST  WITH CONTRAST  TECHNIQUE: Multidetector CT imaging of the chest was performed during intravenous contrast administration.  CONTRAST:  OMNIPAQUE IOHEXOL 300 MG/ML  SOLN  COMPARISON:  Chest x-ray 02/05/2015.  No prior chest CT for review  FINDINGS: Left upper lobe mass lesion measures 3.5 x 11.6 cm. This has spiculated margins and is worrisome for carcinoma. Additional spiculated mass in the superior segment left lower lobe adjacent to the major fissure measures approximately 23 x 13 mm. These lesions are difficult to see on chest x-ray due to their location. Both lesions are worrisome for carcinoma. No mediastinal adenopathy.  Chronic lung disease with probable interstitial scarring on the right.  Right lower lobe airspace disease may represent atelectasis or pneumonia. Small right effusion. Mild left lower lobe atelectasis.  Coronary artery calcification. Heart size normal. Atherosclerotic calcification aortic arch without aneurysm or dissection. No central pulmonary embolus.  Chronic right rib fractures with deformity. No acute right rib fracture. Left rib fractures appear subacute with callus formation. No acute left rib fracture.  Negative for mass in the upper abdomen.  Mild compression fracture at T11 of indeterminate age.  IMPRESSION: Spiculated mass left upper lobe and superior segment left lower lobe. These findings are concerning for carcinoma of the lung. Pneumonia and fibrosis considered less likely. PET-CT recommended for further evaluation.  Probable chronic lung disease with interstitial scarring on the right. Right lower lobe atelectasis/ infiltrate. Small right effusion. Pneumonia not excluded on the right.  Bilateral rib fractures appear subacute to chronic. No acute rib fracture.  Mild compression fracture T11, possibly acute.  These results were called by telephone at the time of interpretation on 02/05/2015 at 11:18 am to [facility] , who verbally acknowledged these results.    Electronically Signed   By: Marlan Palau M.D.   On: 02/05/2015 11:19   Ct Cervical Spine Wo Contrast  02/05/2015   CLINICAL DATA:  81 year old with recent fall. Head laceration. Complains of pain in the upper back.  EXAM: CT CERVICAL SPINE WITHOUT CONTRAST  TECHNIQUE: Multidetector CT imaging of the cervical spine was performed without intravenous contrast. Multiplanar CT image reconstructions were also generated.  COMPARISON:  01/25/2008  FINDINGS: Negative for an acute fracture dislocation in the cervical spine. There is mild sclerosis and deformity of the T3 superior endplate which appears new since 2009. There is mild retrolisthesis at C3-C4 with disc space narrowing. Bilateral facet arthropathy. No significant soft tissue edema. No significant neck lymphadenopathy.  Images of the upper lungs demonstrate a 2.7 x 1.1 cm opacity in the medial left upper lobe. This opacity contains some scattered calcifications. There are additional patchy opacities and calcifications in the right upper lung. There is also a small focal opacity in the medial superior segment of the left lower lobe measuring up to 1.5 cm.  IMPRESSION: Degenerative changes in cervical spine without acute bone abnormality.  Mild compression deformity of the T3 vertebral body of unknown age. Legrand Rams an old injury based on the sclerosis but recommend clinical correlation.  There are focal parenchymal densities or lesions in the left upper lung, particularly in the left upper lobe. These appear to be new since 2009. Findings are nonspecific but a neoplastic process cannot be excluded. Recommend further characterization with a post contrast chest CT.  These results were called by telephone at the time of interpretation on 02/05/2015 at 8:42 am to [facility], PA-C, who verbally acknowledged these results.   Electronically Signed   By: Richarda Overlie M.D.   On: 02/05/2015 08:52   Dg Hip Unilat  With Pelvis 2-3 Views Right  02/21/2015   CLINICAL DATA:   Multiple falls over the past 10 days. Right hip pain and bruising. History of prior right intertrochanteric  fracture. Initial encounter.  EXAM: DG HIP (WITH OR WITHOUT PELVIS) 2-3V RIGHT  COMPARISON:  Single view of the abdomen 12/26/2014.  FINDINGS: A hip screw and long IM nail are in place for fixation of a healed right intertrochanteric fracture. No acute fracture is identified. No notable degenerative change is identified. No focal bony lesion is seen.  IMPRESSION: No acute abnormality. Healed right intertrochanteric fracture with fixation hardware in place.   Electronically Signed   By: Drusilla Kanner M.D.   On: 02/21/2015 15:45    CBC  Recent Labs Lab 02/21/15 1719 02/22/15 0531 02/23/15 0600  WBC 7.7 6.7 8.4  HGB 11.7* 10.8* 11.6*  HCT 34.3* 31.6* 33.9*  PLT 314 305 334  MCV 89.8 89.5 90.4  MCH 30.6 30.6 30.9  MCHC 34.1 34.2 34.2  RDW 14.8 14.9 14.5  LYMPHSABS 0.9  --   --   MONOABS 0.7  --   --   EOSABS 0.1  --   --   BASOSABS 0.0  --   --     Chemistries   Recent Labs Lab 02/21/15 1719 02/22/15 0531 02/23/15 0600  NA 132* 132* 132*  K 3.9 3.8 4.1  CL 96* 98* 95*  CO2 27 26 29   GLUCOSE 107* 116* 137*  BUN 15 10 8   CREATININE 0.77 0.81 0.87  CALCIUM 8.9 8.6* 9.3  AST 19 16  --   ALT 15* 14*  --   ALKPHOS 66 61  --   BILITOT 0.5 0.5  --    ------------------------------------------------------------------------------------------------------------------ estimated creatinine clearance is 53 mL/min (by C-G formula based on Cr of 0.87). ------------------------------------------------------------------------------------------------------------------ No results for input(s): HGBA1C in the last 72 hours. ------------------------------------------------------------------------------------------------------------------ No results for input(s): CHOL, HDL, LDLCALC, TRIG, CHOLHDL, LDLDIRECT in the last 72  hours. ------------------------------------------------------------------------------------------------------------------ No results for input(s): TSH, T4TOTAL, T3FREE, THYROIDAB in the last 72 hours.  Invalid input(s): FREET3 ------------------------------------------------------------------------------------------------------------------ No results for input(s): VITAMINB12, FOLATE, FERRITIN, TIBC, IRON, RETICCTPCT in the last 72 hours.  Coagulation profile No results for input(s): INR, PROTIME in the last 168 hours.  No results for input(s): DDIMER in the last 72 hours.  Cardiac Enzymes No results for input(s): CKMB, TROPONINI, MYOGLOBIN in the last 168 hours.  Invalid input(s): CK ------------------------------------------------------------------------------------------------------------------ Invalid input(s): POCBNP    Tobi Leinweber D.O. on 02/23/2015 at 11:31 AM  Between 7am to 7pm - Pager - [phone]  After 7pm go to [email or web address] - password [email or web address]  And look for the night coverage person covering for me after hours  [facility] Office  [phone]

## 2015-02-24 ENCOUNTER — Inpatient Hospital Stay (HOSPITAL_COMMUNITY): Payer: Medicare Other

## 2015-02-24 LAB — URINE CULTURE: Culture: >=100000

## 2015-02-24 LAB — GLUCOSE, CAPILLARY
GLUCOSE-CAPILLARY: 187 mg/dL — AB (ref 65–99)
Glucose-Capillary: 145 mg/dL — ABNORMAL HIGH (ref 65–99)
Glucose-Capillary: 122 mg/dL — ABNORMAL HIGH (ref 65–99)
Glucose-Capillary: 128 mg/dL — ABNORMAL HIGH (ref 65–99)

## 2015-02-24 LAB — STREP PNEUMONIAE URINARY ANTIGEN: STREP PNEUMO URINARY ANTIGEN: NEGATIVE

## 2015-02-24 MED ORDER — PIPERACILLIN-TAZOBACTAM 3.375 G IVPB
3.3750 g | Freq: Three times a day (TID) | INTRAVENOUS | Status: DC
  Administered 2015-02-24 – 2015-02-28 (×13): 3.375 g via INTRAVENOUS
  Filled 2015-02-24 (×18): qty 50

## 2015-02-24 MED ORDER — PIPERACILLIN-TAZOBACTAM 3.375 G IVPB 30 MIN
3.3750 g | Freq: Once | INTRAVENOUS | Status: AC
  Administered 2015-02-24: 3.375 g via INTRAVENOUS
  Filled 2015-02-24: qty 50

## 2015-02-24 NOTE — Evaluation (Signed)
Clinical/Bedside Swallow Evaluation Patient Details  Name: Kurt Navarro MRN: [id number] Date of Birth: Apr 13, 1927  Today's Date: 02/24/2015 Time: SLP Start Time (ACUTE ONLY): 1112 SLP Stop Time (ACUTE ONLY): 1124 SLP Time Calculation (min) (ACUTE ONLY): 12 min  Past Medical History:  Past Medical History  Diagnosis Date  . Diabetes mellitus without complication   . Hypertension   . Hypercholesteremia   . Heart murmur   . Frequent urination   . BPH (benign prostatic hyperplasia)   . GERD (gastroesophageal reflux disease)   . Coronary artery disease     medical management with aspirin  . Glaucoma    Past Surgical History:  Past Surgical History  Procedure Laterality Date  . Leg surgery  5 years ago    Rod in right leg  . Back surgery  years ago    lower  . Hernia repair  years ago  . Cystoscopy with retrograde pyelogram, ureteroscopy and stent placement Bilateral 12/12/2014    Procedure: CYSTO WITH BILATERAL RETROGRADE/BIOPSY WITH FULGURATION/HYDRODISTENSION OF BLADDER;  Surgeon: Bjorn Pippin, MD;  Location: [facility];  Service: Urology;  Laterality: Bilateral;   HPI:  82 y.o. male with a past medical history of type 2 diabetes, hypertension, hyperlipidemia, heart murmur, BPH, GERD, hernia repair, coronary artery disease, glaucoma who was brought in by his son to the emergency department due to progressive weakness associated with 4 falls in the last 9 days and confusion. CXR Patchy bibasilar airspace opacities, right greater than left, raise concern for pneumonia given the patient's symptoms. Small right pleural effusion noted. Head CT negative. Found to have acute encephalopathy due to UTI.   Assessment / Plan / Recommendation Clinical Impression  Respirations wet, gurgly and congested at rest. Unable to expectorate mucous with frequent verbal cues for hard cough, suspect pooling of secretions in pharynx. Given current pna and clinical findings, rec pt not eat lunch and MBS scheduled  for 1300 today.     Aspiration Risk   (mod-severe)    Diet Recommendation  (await MBS)        Other  Recommendations Oral Care Recommendations: Oral care BID   Follow Up Recommendations       Frequency and Duration        Pertinent Vitals/Pain none         Swallow Study           Oral/Motor/Sensory Function Overall Oral Motor/Sensory Function: Appears within functional limits for tasks assessed   Ice Chips Ice chips: Not tested   Thin Liquid Thin Liquid: Impaired Presentation: Cup;Spoon Pharyngeal  Phase Impairments: Suspected delayed Swallow;Wet Vocal Quality    Nectar Thick Nectar Thick Liquid: Not tested   Honey Thick Honey Thick Liquid: Not tested   Puree Puree: Impaired Presentation: Spoon Pharyngeal Phase Impairments: Suspected delayed Swallow   Solid   GO    Solid: Not tested       Royce Macadamia 02/24/2015,11:37 AM  Breck Coons Lonell Face.Ed [employer] [phone]

## 2015-02-24 NOTE — Progress Notes (Signed)
OT Cancellation Note  Patient Details Name: Kurt Navarro MRN: [id number] DOB: 08/04/1926   Cancelled Treatment:    Reason Eval/Treat Not Completed: Other (comment) Pt is Medicare and current D/C plan is SNF. No apparent immediate acute care OT needs, therefore will defer OT to SNF. If OT eval is needed please call Acute Rehab Dept. at [phone] or text page OT at [phone].    Evette Georges [phone] 02/24/2015, 7:23 AM

## 2015-02-24 NOTE — Progress Notes (Signed)
[facility]                                                                              Patient Demographics  Kurt Navarro, is a 81 y.o. male, DOB - [birth year], ZOX:[id number]  Admit date - 02/21/2015   Admitting Physician Bobette Mo, MD  Outpatient Primary MD for the patient is Thayer Headings, MD  LOS - 3   Chief Complaint  Patient presents with  . Fall      HPI 02/21/2015 by Dr. Sanda Klein Kurt Navarro is a 81 y.o. male with a past medical history of type 2 diabetes, hypertension, hyperlipidemia, heart murmur, BPH, GERD, coronary artery disease, glaucoma who was brought in by his son to the emergency department due to progressive weakness associated with 4 falls in the last 9 days and confusion. Apparently the patient has not hit his head, he hasn't have fever or chills, but he is usually alert oriented 3 and has been unable to tell the current date even down to the year, as well as being confused with other events for the past week plus.He is he is oriented to place and is currently in no acute distress. He is unable to provide further details on his history.  Assessment & Plan   Acute encephalopathy secondary UTI/Pneumonia -CT head: No acute intracranial abnormality -CXR 8/26: Decreased lung volumes with increased bibasilar atelectasis, questionable small right pleural effusion -UA: WBC TNTC, Large leukocytes  -Urine culture >100K Enterococcus -Patient currently afebrile with no leukocytosis -CXR 8/28: Has a bi-basilar airspace opacities, concerning for pneumonia -Continue vancomycin and Zosyn -Pending strep and legionella urine antigens  Dysphagia -Will obtain speech eval  Fall/physical deconditioning -PT consulted and appreciated, recommended SNF -social work consulted for placement  Diabetes mellitus, type 2 -Metformin held -Continue insulin sliding scale CBG monitoring  Hyperlipidemia -Continue statin  Essential hypertension -Continue  Cozaar  BPH -Continue Proscar, Flomax  Hypothyroidism -Continue Synthroid  Code Status: Full  Family Communication: None at bedside  Disposition Plan: Admitted.  Pending SNF and urine culture sensitivities  Time Spent in minutes   30 minutes  Procedures  None  Consults   None  DVT Prophylaxis  Lovenox  Lab Results  Component Value Date   PLT 334 02/23/2015    Medications  Scheduled Meds: . aspirin  325 mg Oral Daily  . enoxaparin (LOVENOX) injection  40 mg Subcutaneous Q24H  . ferrous sulfate  325 mg Oral BID  . finasteride  5 mg Oral Daily  . insulin aspart  0-5 Units Subcutaneous QHS  . insulin aspart  0-9 Units Subcutaneous TID WC  . latanoprost  1 drop Both Eyes QHS  . levothyroxine  50 mcg Oral QAC breakfast  . losartan  100 mg Oral Daily  . multivitamin with minerals  1 tablet Oral Daily  . pantoprazole  40 mg Oral Daily  . piperacillin-tazobactam (ZOSYN)  IV  3.375 g Intravenous Q8H  . pravastatin  20 mg Oral QPM  . tamsulosin  0.4 mg Oral QPC supper  . vancomycin  1,250 mg Intravenous Q24H   Continuous Infusions:  PRN Meds:.ondansetron **OR** ondansetron (ZOFRAN) IV  Antibiotics    Anti-infectives  Start     Dose/Rate Route Frequency Ordered Stop   02/24/15 0600  piperacillin-tazobactam (ZOSYN) IVPB 3.375 g     3.375 g 12.5 mL/hr over 240 Minutes Intravenous Every 8 hours 02/24/15 0027     02/24/15 0030  piperacillin-tazobactam (ZOSYN) IVPB 3.375 g     3.375 g 100 mL/hr over 30 Minutes Intravenous  Once 02/24/15 0027 02/24/15 0130   02/23/15 1700  vancomycin (VANCOCIN) 1,250 mg in sodium chloride 0.9 % 250 mL IVPB     1,250 mg 166.7 mL/hr over 90 Minutes Intravenous Every 24 hours 02/23/15 1556     02/22/15 1630  cefTRIAXone (ROCEPHIN) 1 g in dextrose 5 % 50 mL IVPB  Status:  Discontinued     1 g 100 mL/hr over 30 Minutes Intravenous Every 24 hours 02/21/15 2054 02/23/15 1545   02/21/15 1630  cefTRIAXone (ROCEPHIN) 1 g in dextrose 5 % 50 mL  IVPB     1 g 100 mL/hr over 30 Minutes Intravenous  Once 02/21/15 1626 02/21/15 1822      Subjective:   Kurt Navarro seen and examined today.  Complains of cough.  Denies chest pain, shortness of breath, abdominal pain.   Objective:   Filed Vitals:   02/23/15 1249 02/23/15 2132 02/23/15 2234 02/24/15 0500  BP: 176/83 153/69  154/74  Pulse: 79 95  78  Temp: 98.6 F (37 C) 100 F (37.8 C) 99.3 F (37.4 C) 98.9 F (37.2 C)  TempSrc: Oral Oral Oral Oral  Resp: 20 20  19   Height:      Weight:      SpO2: 94% 92%  94%    Wt Readings from Last 3 Encounters:  02/21/15 65.4 kg (144 lb 2.9 oz)  12/12/14 66.225 kg (146 lb)  12/11/14 66.225 kg (146 lb)     Intake/Output Summary (Last 24 hours) at 02/24/15 1233 Last data filed at 02/24/15 1100  Gross per 24 hour  Intake    506 ml  Output   1625 ml  Net  -1119 ml    Exam  General: Well developed, elderly, chronically ill-appearing  HEENT: NCAT, mucous membranes moist.   Cardiovascular: S1 S2 auscultated, RRR  Respiratory: Diminished breath sounds, Course at bases, no wheezing.  occ cough.  Abdomen: Soft, nontender, nondistended, + bowel sounds  Extremities: warm dry without cyanosis clubbing or edema  Neuro: AAO x2, self and place.  Nonfocal.  Skin: Without rashes exudates or nodules, bruising and ecchymosis noted on the extremities.  Data Review   Micro Results Recent Results (from the past 240 hour(s))  Urine culture     Status: None   Collection Time: 02/21/15  5:10 PM  Result Value Ref Range Status   Specimen Description URINE, CLEAN CATCH  Final   Special Requests Immunocompromised  Final   Culture >=100,000 COLONIES/mL ENTEROCOCCUS SPECIES  Final   Report Status 02/24/2015 FINAL  Final   Organism ID, Bacteria ENTEROCOCCUS SPECIES  Final      Susceptibility   Enterococcus species - MIC*    AMPICILLIN <=2 RESISTANT Resistant     LEVOFLOXACIN >=8 RESISTANT Resistant     NITROFURANTOIN <=16 SENSITIVE  Sensitive     VANCOMYCIN 1 SENSITIVE Sensitive     * >=100,000 COLONIES/mL ENTEROCOCCUS SPECIES    Radiology Reports Dg Chest 2 View  02/21/2015   CLINICAL DATA:  Multiple falls yesterday, increased confusion, incontinence, decreased appetite, history hypertension, diabetes mellitus, coronary artery disease, former smoker  EXAM: CHEST  2 VIEW  COMPARISON:  02/13/2015  FINDINGS: Low lung volumes.  Upper normal heart size.  Atherosclerotic calcification aorta.  Pulmonary vascular congestion.  Bibasilar atelectasis, increased.  Apices clear.  Questionable small RIGHT pleural effusion.  No pneumothorax.  Bones demineralized.  IMPRESSION: Decreased lung volumes with increased bibasilar atelectasis and questionable small RIGHT pleural effusion.   Electronically Signed   By: Ulyses Southward M.D.   On: 02/21/2015 18:17   Dg Chest 2 View  02/05/2015   CLINICAL DATA:  Larey Seat. Week and soreness all over. Tenderness in the back and right shoulder area.  EXAM: CHEST  2 VIEW  COMPARISON:  04/28/2014  FINDINGS: There are old right rib fractures. Age-indeterminate fractures involving the left fifth and sixth ribs. Densities at the left lung base could represent atelectasis or scarring. Heart size is grossly normal with atherosclerotic calcifications at the aorta. Negative for a pneumothorax. Again noted is a compression fracture in the upper lumbar spine.  IMPRESSION: Low lung volumes bilaterally.  Bilateral rib fractures. Left rib fractures are age indeterminate and new from the prior examination.   Electronically Signed   By: Richarda Overlie M.D.   On: 02/05/2015 07:55   Dg Lumbar Spine Complete  02/05/2015   CLINICAL DATA:  Injury, fall in the bathroom  EXAM: LUMBAR SPINE - COMPLETE 4+ VIEW  COMPARISON:  01/03/2015  FINDINGS: Five views of the lumbar spine submitted. There is diffuse osteopenia. Atherosclerotic calcifications of abdominal aorta and iliac arteries. Again noted compression deformity upper endplate of L1  vertebral body is stable from prior exam. There is disc space flattening with mild anterior spurring at T12-L1 and L1-L2 level. Mild anterior spurring upper endplate of L3 and L4 vertebral body. Minimal disc space flattening at L5-S1 level. There is mild lower lumbar levoscoliosis.  IMPRESSION: No acute fracture or subluxation. Mild lower lumbar levoscoliosis. Degenerative changes as described above. Stable compression fracture upper endplate of L1 vertebral body. Diffuse osteopenia.   Electronically Signed   By: Natasha Mead M.D.   On: 02/05/2015 08:05   Dg Shoulder Right  02/05/2015   CLINICAL DATA:  Larey Seat with weakness. Tenderness in the back and right shoulder area.  EXAM: RIGHT SHOULDER - 2+ VIEW  COMPARISON:  04/28/2014  FINDINGS: There are old right rib fractures. The right shoulder is located without a fracture. Right AC joint is intact. Evidence for spurring and degenerative changes at the right glenohumeral joint.  IMPRESSION: No acute bone abnormality in the right shoulder.   Electronically Signed   By: Richarda Overlie M.D.   On: 02/05/2015 08:00   Ct Head Wo Contrast  02/21/2015   CLINICAL DATA:  Multiple falls with 2 yesterday. Confusion. Incontinence. Decreased appetite.  EXAM: CT HEAD WITHOUT CONTRAST  TECHNIQUE: Contiguous axial images were obtained from the base of the skull through the vertex without intravenous contrast.  COMPARISON:  None  FINDINGS: Sinuses/Soft tissues: Surgical changes about the globes. Mucosal thickening of the left maxillary sinus. No skull fracture. Clear mastoid air cells.  Intracranial: Expected cerebral atrophy. Moderate low density in the periventricular white matter likely related to small vessel disease. No mass lesion, hemorrhage, hydrocephalus, acute infarct, intra-axial, or extra-axial fluid collection.  IMPRESSION: 1.  No acute intracranial abnormality. 2.  Cerebral atrophy and small vessel ischemic change. 3. Mild sinus disease.   Electronically Signed   By: Jeronimo Greaves M.D.   On: 02/21/2015 17:56   Ct Head Wo Contrast  02/05/2015   CLINICAL DATA:  Fall.  Hit head.  EXAM: CT HEAD WITHOUT CONTRAST  TECHNIQUE: Contiguous axial images were obtained from the base of the skull through the vertex without intravenous contrast.  COMPARISON:  None.  FINDINGS: There is diffuse low attenuation throughout the subcortical and periventricular white matter compatible with chronic microvascular disease. Prominence of the sulci and the ventricles noted consistent with brain atrophy. No abnormal extra-axial fluid collections, intracranial hemorrhage or mass noted. There is mild mucosal thickening involving the maxillary sinuses. The paranasal sinuses are clear. The mastoid air cells appear clear. The calvarium is intact.  IMPRESSION: 1. No acute intracranial abnormality. 2. Chronic microvascular disease and brain atrophy.   Electronically Signed   By: Signa Kell M.D.   On: 02/05/2015 08:26   Ct Chest W Contrast  02/05/2015   CLINICAL DATA:  Fall.  Right rib pain  EXAM: CT CHEST WITH CONTRAST  TECHNIQUE: Multidetector CT imaging of the chest was performed during intravenous contrast administration.  CONTRAST:  OMNIPAQUE IOHEXOL 300 MG/ML  SOLN  COMPARISON:  Chest x-ray 02/05/2015.  No prior chest CT for review  FINDINGS: Left upper lobe mass lesion measures 3.5 x 11.6 cm. This has spiculated margins and is worrisome for carcinoma. Additional spiculated mass in the superior segment left lower lobe adjacent to the major fissure measures approximately 23 x 13 mm. These lesions are difficult to see on chest x-ray due to their location. Both lesions are worrisome for carcinoma. No mediastinal adenopathy.  Chronic lung disease with probable interstitial scarring on the right.  Right lower lobe airspace disease may represent atelectasis or pneumonia. Small right effusion. Mild left lower lobe atelectasis.  Coronary artery calcification. Heart size normal. Atherosclerotic calcification  aortic arch without aneurysm or dissection. No central pulmonary embolus.  Chronic right rib fractures with deformity. No acute right rib fracture. Left rib fractures appear subacute with callus formation. No acute left rib fracture.  Negative for mass in the upper abdomen.  Mild compression fracture at T11 of indeterminate age.  IMPRESSION: Spiculated mass left upper lobe and superior segment left lower lobe. These findings are concerning for carcinoma of the lung. Pneumonia and fibrosis considered less likely. PET-CT recommended for further evaluation.  Probable chronic lung disease with interstitial scarring on the right. Right lower lobe atelectasis/ infiltrate. Small right effusion. Pneumonia not excluded on the right.  Bilateral rib fractures appear subacute to chronic. No acute rib fracture.  Mild compression fracture T11, possibly acute.  These results were called by telephone at the time of interpretation on 02/05/2015 at 11:18 am to [facility] , who verbally acknowledged these results.   Electronically Signed   By: Marlan Palau M.D.   On: 02/05/2015 11:19   Ct Cervical Spine Wo Contrast  02/05/2015   CLINICAL DATA:  81 year old with recent fall. Head laceration. Complains of pain in the upper back.  EXAM: CT CERVICAL SPINE WITHOUT CONTRAST  TECHNIQUE: Multidetector CT imaging of the cervical spine was performed without intravenous contrast. Multiplanar CT image reconstructions were also generated.  COMPARISON:  01/25/2008  FINDINGS: Negative for an acute fracture dislocation in the cervical spine. There is mild sclerosis and deformity of the T3 superior endplate which appears new since 2009. There is mild retrolisthesis at C3-C4 with disc space narrowing. Bilateral facet arthropathy. No significant soft tissue edema. No significant neck lymphadenopathy.  Images of the upper lungs demonstrate a 2.7 x 1.1 cm opacity in the medial left upper lobe. This opacity contains some scattered calcifications.  There are additional patchy opacities and  calcifications in the right upper lung. There is also a small focal opacity in the medial superior segment of the left lower lobe measuring up to 1.5 cm.  IMPRESSION: Degenerative changes in cervical spine without acute bone abnormality.  Mild compression deformity of the T3 vertebral body of unknown age. Legrand Rams an old injury based on the sclerosis but recommend clinical correlation.  There are focal parenchymal densities or lesions in the left upper lung, particularly in the left upper lobe. These appear to be new since 2009. Findings are nonspecific but a neoplastic process cannot be excluded. Recommend further characterization with a post contrast chest CT.  These results were called by telephone at the time of interpretation on 02/05/2015 at 8:42 am to [facility], PA-C, who verbally acknowledged these results.   Electronically Signed   By: Richarda Overlie M.D.   On: 02/05/2015 08:52   Dg Chest Port 1 View  02/24/2015   CLINICAL DATA:  Increased tracheal secretions, acute onset. Initial encounter.  EXAM: PORTABLE CHEST - 1 VIEW  COMPARISON:  Chest radiograph performed 02/21/2015  FINDINGS: The lungs remain hypoexpanded. Patchy bibasilar airspace opacities, right greater than left, raise concern for pneumonia, given the patient's symptoms. A small right pleural effusion is noted. No pneumothorax is seen.  The cardiomediastinal silhouette remains normal in size. No acute osseous abnormalities are identified.  IMPRESSION: Lungs hypoexpanded. Patchy bibasilar airspace opacities, right greater than left, raise concern for pneumonia given the patient's symptoms. Small right pleural effusion noted.   Electronically Signed   By: Roanna Raider M.D.   On: 02/24/2015 00:01   Dg Hip Unilat  With Pelvis 2-3 Views Right  02/21/2015   CLINICAL DATA:  Multiple falls over the past 10 days. Right hip pain and bruising. History of prior right intertrochanteric fracture. Initial  encounter.  EXAM: DG HIP (WITH OR WITHOUT PELVIS) 2-3V RIGHT  COMPARISON:  Single view of the abdomen 12/26/2014.  FINDINGS: A hip screw and long IM nail are in place for fixation of a healed right intertrochanteric fracture. No acute fracture is identified. No notable degenerative change is identified. No focal bony lesion is seen.  IMPRESSION: No acute abnormality. Healed right intertrochanteric fracture with fixation hardware in place.   Electronically Signed   By: Drusilla Kanner M.D.   On: 02/21/2015 15:45    CBC  Recent Labs Lab 02/21/15 1719 02/22/15 0531 02/23/15 0600  WBC 7.7 6.7 8.4  HGB 11.7* 10.8* 11.6*  HCT 34.3* 31.6* 33.9*  PLT 314 305 334  MCV 89.8 89.5 90.4  MCH 30.6 30.6 30.9  MCHC 34.1 34.2 34.2  RDW 14.8 14.9 14.5  LYMPHSABS 0.9  --   --   MONOABS 0.7  --   --   EOSABS 0.1  --   --   BASOSABS 0.0  --   --     Chemistries   Recent Labs Lab 02/21/15 1719 02/22/15 0531 02/23/15 0600  NA 132* 132* 132*  K 3.9 3.8 4.1  CL 96* 98* 95*  CO2 27 26 29   GLUCOSE 107* 116* 137*  BUN 15 10 8   CREATININE 0.77 0.81 0.87  CALCIUM 8.9 8.6* 9.3  AST 19 16  --   ALT 15* 14*  --   ALKPHOS 66 61  --   BILITOT 0.5 0.5  --    ------------------------------------------------------------------------------------------------------------------ estimated creatinine clearance is 53 mL/min (by C-G formula based on Cr of 0.87). ------------------------------------------------------------------------------------------------------------------ No results for input(s): HGBA1C in the last 72 hours. ------------------------------------------------------------------------------------------------------------------  No results for input(s): CHOL, HDL, LDLCALC, TRIG, CHOLHDL, LDLDIRECT in the last 72 hours. ------------------------------------------------------------------------------------------------------------------ No results for input(s): TSH, T4TOTAL, T3FREE, THYROIDAB in the last  72 hours.  Invalid input(s): FREET3 ------------------------------------------------------------------------------------------------------------------ No results for input(s): VITAMINB12, FOLATE, FERRITIN, TIBC, IRON, RETICCTPCT in the last 72 hours.  Coagulation profile No results for input(s): INR, PROTIME in the last 168 hours.  No results for input(s): DDIMER in the last 72 hours.  Cardiac Enzymes No results for input(s): CKMB, TROPONINI, MYOGLOBIN in the last 168 hours.  Invalid input(s): CK ------------------------------------------------------------------------------------------------------------------ Invalid input(s): POCBNP    Binh Doten D.O. on 02/24/2015 at 12:33 PM  Between 7am to 7pm - Pager - [phone]  After 7pm go to [email or web address] - password [email or web address]  And look for the night coverage person covering for me after hours  [facility] Office  [phone]

## 2015-02-24 NOTE — Progress Notes (Signed)
On-call provider Merdis Delay, NP made aware of STAT CXR result .

## 2015-02-24 NOTE — Progress Notes (Signed)
Speech Pathology   MBSS complete. Full report located under chart review in imaging section.  Click on DG swallow function Recommend: NPO, alternate means nutrition.    Breck Coons [place].[employer] [phone]

## 2015-02-24 NOTE — Care Management Important Message (Signed)
Important Message  Patient Details  Name: Tyquon Near MRN: [id number] Date of Birth: 15-Jan-1927   Medicare Important Message Given:  Yes-second notification given    Bernadette Hoit 02/24/2015, 3:54 PM

## 2015-02-25 LAB — BASIC METABOLIC PANEL
Anion gap: 9 (ref 5–15)
BUN: 10 mg/dL (ref 6–20)
CALCIUM: 8.8 mg/dL — AB (ref 8.9–10.3)
CHLORIDE: 97 mmol/L — AB (ref 101–111)
CO2: 24 mmol/L (ref 22–32)
CREATININE: 0.86 mg/dL (ref 0.61–1.24)
GFR calc non Af Amer: >60 mL/min (ref >60)
Glucose, Bld: 121 mg/dL — ABNORMAL HIGH (ref 65–99)
Potassium: 3.9 mmol/L (ref 3.5–5.1)
SODIUM: 130 mmol/L — AB (ref 135–145)

## 2015-02-25 LAB — GLUCOSE, CAPILLARY
GLUCOSE-CAPILLARY: 114 mg/dL — AB (ref 65–99)
GLUCOSE-CAPILLARY: 135 mg/dL — AB (ref 65–99)
Glucose-Capillary: 115 mg/dL — ABNORMAL HIGH (ref 65–99)
Glucose-Capillary: 128 mg/dL — ABNORMAL HIGH (ref 65–99)

## 2015-02-25 MED ORDER — PANTOPRAZOLE SODIUM 40 MG PO PACK
40.0000 mg | PACK | Freq: Every day | ORAL | Status: DC
  Administered 2015-02-25 – 2015-03-02 (×5): 40 mg
  Filled 2015-02-25 (×8): qty 20

## 2015-02-25 MED ORDER — JEVITY 1.2 CAL PO LIQD
1000.0000 mL | ORAL | Status: DC
  Administered 2015-02-25 – 2015-02-28 (×5): 1000 mL
  Filled 2015-02-25 (×13): qty 1000

## 2015-02-25 MED ORDER — PRAVASTATIN SODIUM 20 MG PO TABS
20.0000 mg | ORAL_TABLET | Freq: Every evening | ORAL | Status: DC
  Administered 2015-02-25 – 2015-03-01 (×5): 20 mg
  Filled 2015-02-25 (×5): qty 1

## 2015-02-25 MED ORDER — LOSARTAN POTASSIUM 50 MG PO TABS
100.0000 mg | ORAL_TABLET | Freq: Every day | ORAL | Status: DC
  Administered 2015-02-26 – 2015-03-02 (×5): 100 mg
  Administered 2015-03-03 (×2): 50 mg
  Filled 2015-02-25 (×6): qty 2

## 2015-02-25 MED ORDER — LEVOTHYROXINE SODIUM 50 MCG PO TABS
50.0000 ug | ORAL_TABLET | Freq: Every day | ORAL | Status: DC
  Administered 2015-02-26 – 2015-03-03 (×5): 50 ug
  Filled 2015-02-25 (×5): qty 1

## 2015-02-25 MED ORDER — ADULT MULTIVITAMIN W/MINERALS CH
1.0000 | ORAL_TABLET | Freq: Every day | ORAL | Status: DC
  Administered 2015-02-26 – 2015-03-03 (×6): 1
  Filled 2015-02-25 (×6): qty 1

## 2015-02-25 MED ORDER — FERROUS SULFATE 300 (60 FE) MG/5ML PO SYRP
300.0000 mg | ORAL_SOLUTION | Freq: Two times a day (BID) | ORAL | Status: DC
  Administered 2015-02-25 – 2015-02-28 (×6): 300 mg
  Filled 2015-02-25 (×8): qty 5

## 2015-02-25 MED ORDER — ASPIRIN 325 MG PO TABS
325.0000 mg | ORAL_TABLET | Freq: Every day | ORAL | Status: DC
  Administered 2015-02-26 – 2015-02-27 (×2): 325 mg
  Filled 2015-02-25 (×2): qty 1

## 2015-02-25 NOTE — Clinical Social Work Note (Signed)
Clinical Social Worker continuing to follow patient and family for support and discharge planning needs.  Patient son has been in communication with [facility] and bed offer has been extended.  CSW contacted admissions coordinator who requested updated clinical information - clinicals sent.  Facility hopeful to continue to provide bed offer pending medical updates for patient.  Patient currently with Panda tube feeds - will need to transition to diet vs. PEG prior to SNF discharge.  CSW remains available for support and to facilitate patient discharge needs once medically stable.  Macario Golds, [place] [phone]

## 2015-02-25 NOTE — Progress Notes (Signed)
PT Cancellation Note  Patient Details Name: Kurt Navarro MRN: [id number] DOB: [birth year]   Cancelled Treatment:    Reason Eval/Treat Not Completed: Patient at procedure or test/unavailable Patient currently off unit for test/procedure. Will follow up as time/schedule allows. Likely tomorrow.  [name] 02/25/2015, 3:33 PM  [name], [place] [phone]

## 2015-02-25 NOTE — Progress Notes (Signed)
[facility]                                                                              Patient Demographics  Kurt Navarro, is a 81 y.o. male, DOB - 04-23-1927, ZOX:[id number]  Admit date - 02/21/2015   Admitting Physician Bobette Mo, MD  Outpatient Primary MD for the patient is Thayer Headings, MD  LOS - 4   Chief Complaint  Patient presents with  . Fall      HPI 02/21/2015 by Dr. Sanda Klein Kurt Navarro is a 81 y.o. male with a past medical history of type 2 diabetes, hypertension, hyperlipidemia, heart murmur, BPH, GERD, coronary artery disease, glaucoma who was brought in by his son to the emergency department due to progressive weakness associated with 4 falls in the last 9 days and confusion. Apparently the patient has not hit his head, he hasn't have fever or chills, but he is usually alert oriented 3 and has been unable to tell the current date even down to the year, as well as being confused with other events for the past week plus.He is he is oriented to place and is currently in no acute distress. He is unable to provide further details on his history.  Interim history Patient became altered again.   Chest x-ray showed pneumonia, currently on vancomycin and Zosyn.  Speech evaluated  Patient and ordered barium swallow , patient was found to be dysphagia. PEG tube inserted, nutrition consulted for feeds. Will continue to monitor progression. MRI of the head currently pending.  Assessment & Plan   Acute encephalopathy secondary UTI/Pneumonia -CT head: No acute intracranial abnormality -CXR 8/26: Decreased lung volumes with increased bibasilar atelectasis, questionable small right pleural effusion -UA: WBC TNTC, Large leukocytes  -Urine culture >100K Enterococcus -Patient currently afebrile with no leukocytosis -CXR 8/28: Has a bi-basilar airspace opacities, concerning for pneumonia -question whether pneumonia secondary to aspiration. -Continue vancomycin and  Zosyn -Strep pneumonia urine antigen negative, pending legionella urine antigen  Dysphagia -Speech consulted and appreciated. Patient had barium swallow and failed. -pended tube inserted, nutrition consult for tube feeds -MRI of the head pending  Fall/physical deconditioning -PT consulted and appreciated, recommended SNF -social work consulted for placement  Diabetes mellitus, type 2 -Metformin held -Continue insulin sliding scale CBG monitoring  Hyperlipidemia -Continue statin  Essential hypertension -Continue Cozaar  BPH -Continue Proscar, Flomax  Hypothyroidism -Continue Synthroid  Code Status: Full  Family Communication: None at bedside, son via phone (Cell [phone])  Disposition Plan: Admitted.  Pending SNF.  Pending improvement in swallow function and MRI.  Time Spent in minutes   30 minutes  Procedures  Barium swallow  Consults   None  DVT Prophylaxis  Lovenox  Lab Results  Component Value Date   PLT 334 02/23/2015    Medications  Scheduled Meds: . aspirin  325 mg Oral Daily  . enoxaparin (LOVENOX) injection  40 mg Subcutaneous Q24H  . ferrous sulfate  325 mg Oral BID  . finasteride  5 mg Oral Daily  . insulin aspart  0-5 Units Subcutaneous QHS  . insulin aspart  0-9 Units Subcutaneous TID WC  . latanoprost  1 drop Both Eyes QHS  .  levothyroxine  50 mcg Oral QAC breakfast  . losartan  100 mg Oral Daily  . multivitamin with minerals  1 tablet Oral Daily  . pantoprazole  40 mg Oral Daily  . piperacillin-tazobactam (ZOSYN)  IV  3.375 g Intravenous Q8H  . pravastatin  20 mg Oral QPM  . tamsulosin  0.4 mg Oral QPC supper  . vancomycin  1,250 mg Intravenous Q24H   Continuous Infusions:  PRN Meds:.ondansetron **OR** ondansetron (ZOFRAN) IV  Antibiotics    Anti-infectives    Start     Dose/Rate Route Frequency Ordered Stop   02/24/15 0600  piperacillin-tazobactam (ZOSYN) IVPB 3.375 g     3.375 g 12.5 mL/hr over 240 Minutes Intravenous  Every 8 hours 02/24/15 0027     02/24/15 0030  piperacillin-tazobactam (ZOSYN) IVPB 3.375 g     3.375 g 100 mL/hr over 30 Minutes Intravenous  Once 02/24/15 0027 02/24/15 0130   02/23/15 1700  vancomycin (VANCOCIN) 1,250 mg in sodium chloride 0.9 % 250 mL IVPB     1,250 mg 166.7 mL/hr over 90 Minutes Intravenous Every 24 hours 02/23/15 1556     02/22/15 1630  cefTRIAXone (ROCEPHIN) 1 g in dextrose 5 % 50 mL IVPB  Status:  Discontinued     1 g 100 mL/hr over 30 Minutes Intravenous Every 24 hours 02/21/15 2054 02/23/15 1545   02/21/15 1630  cefTRIAXone (ROCEPHIN) 1 g in dextrose 5 % 50 mL IVPB     1 g 100 mL/hr over 30 Minutes Intravenous  Once 02/21/15 1626 02/21/15 1822      Subjective:   Kurt Navarro seen and examined today.  States he is feeling "ok" this morning. Denies chest pain, shortness of breath, abdominal pain.   Objective:   Filed Vitals:   02/24/15 0500 02/24/15 1437 02/24/15 2130 02/25/15 0544  BP: 154/74 151/69 177/72 166/71  Pulse: 78 84 92 98  Temp: 98.9 F (37.2 C) 98 F (36.7 C) 98.9 F (37.2 C) 98.8 F (37.1 C)  TempSrc: Oral Oral Oral Oral  Resp: Height:      Weight:      SpO2: 94% 95% 95% 95%    Wt Readings from Last 3 Encounters:  02/21/15 65.4 kg (144 lb 2.9 oz)  12/12/14 66.225 kg (146 lb)  12/11/14 66.225 kg (146 lb)     Intake/Output Summary (Last 24 hours) at 02/25/15 1134 Last data filed at 02/25/15 0851  Gross per 24 hour  Intake      0 ml  Output    600 ml  Net   -600 ml    Exam  General: Well developed, elderly, chronically ill-appearing  HEENT: NCAT, mucous membranes moist.   Cardiovascular: S1 S2 auscultated, RRR  Respiratory: Diminished breath sounds, Course at bases.  Abdomen: Soft, nontender, nondistended, + bowel sounds  Extremities: warm dry without cyanosis clubbing or edema  Neuro: AAOx3,  Nonfocal.  Skin: bruising and ecchymosis noted on the extremities.  Data Review   Micro Results Recent  Results (from the past 240 hour(s))  Urine culture     Status: None   Collection Time: 02/21/15  5:10 PM  Result Value Ref Range Status   Specimen Description URINE, CLEAN CATCH  Final   Special Requests Immunocompromised  Final   Culture >=100,000 COLONIES/mL ENTEROCOCCUS SPECIES  Final   Report Status 02/24/2015 FINAL  Final   Organism ID, Bacteria ENTEROCOCCUS SPECIES  Final      Susceptibility   Enterococcus species -  MIC*    AMPICILLIN <=2 RESISTANT Resistant     LEVOFLOXACIN >=8 RESISTANT Resistant     NITROFURANTOIN <=16 SENSITIVE Sensitive     VANCOMYCIN 1 SENSITIVE Sensitive     * >=100,000 COLONIES/mL ENTEROCOCCUS SPECIES    Radiology Reports Dg Chest 2 View  02/21/2015   CLINICAL DATA:  Multiple falls yesterday, increased confusion, incontinence, decreased appetite, history hypertension, diabetes mellitus, coronary artery disease, former smoker  EXAM: CHEST  2 VIEW  COMPARISON:  02/13/2015  FINDINGS: Low lung volumes.  Upper normal heart size.  Atherosclerotic calcification aorta.  Pulmonary vascular congestion.  Bibasilar atelectasis, increased.  Apices clear.  Questionable small RIGHT pleural effusion.  No pneumothorax.  Bones demineralized.  IMPRESSION: Decreased lung volumes with increased bibasilar atelectasis and questionable small RIGHT pleural effusion.   Electronically Signed   By: Ulyses Southward M.D.   On: 02/21/2015 18:17   Dg Chest 2 View  02/05/2015   CLINICAL DATA:  Larey Seat. Week and soreness all over. Tenderness in the back and right shoulder area.  EXAM: CHEST  2 VIEW  COMPARISON:  04/28/2014  FINDINGS: There are old right rib fractures. Age-indeterminate fractures involving the left fifth and sixth ribs. Densities at the left lung base could represent atelectasis or scarring. Heart size is grossly normal with atherosclerotic calcifications at the aorta. Negative for a pneumothorax. Again noted is a compression fracture in the upper lumbar spine.  IMPRESSION: Low lung  volumes bilaterally.  Bilateral rib fractures. Left rib fractures are age indeterminate and new from the prior examination.   Electronically Signed   By: Richarda Overlie M.D.   On: 02/05/2015 07:55   Dg Lumbar Spine Complete  02/05/2015   CLINICAL DATA:  Injury, fall in the bathroom  EXAM: LUMBAR SPINE - COMPLETE 4+ VIEW  COMPARISON:  01/03/2015  FINDINGS: Five views of the lumbar spine submitted. There is diffuse osteopenia. Atherosclerotic calcifications of abdominal aorta and iliac arteries. Again noted compression deformity upper endplate of L1 vertebral body is stable from prior exam. There is disc space flattening with mild anterior spurring at T12-L1 and L1-L2 level. Mild anterior spurring upper endplate of L3 and L4 vertebral body. Minimal disc space flattening at L5-S1 level. There is mild lower lumbar levoscoliosis.  IMPRESSION: No acute fracture or subluxation. Mild lower lumbar levoscoliosis. Degenerative changes as described above. Stable compression fracture upper endplate of L1 vertebral body. Diffuse osteopenia.   Electronically Signed   By: Natasha Mead M.D.   On: 02/05/2015 08:05   Dg Shoulder Right  02/05/2015   CLINICAL DATA:  Larey Seat with weakness. Tenderness in the back and right shoulder area.  EXAM: RIGHT SHOULDER - 2+ VIEW  COMPARISON:  04/28/2014  FINDINGS: There are old right rib fractures. The right shoulder is located without a fracture. Right AC joint is intact. Evidence for spurring and degenerative changes at the right glenohumeral joint.  IMPRESSION: No acute bone abnormality in the right shoulder.   Electronically Signed   By: Richarda Overlie M.D.   On: 02/05/2015 08:00   Ct Head Wo Contrast  02/21/2015   CLINICAL DATA:  Multiple falls with 2 yesterday. Confusion. Incontinence. Decreased appetite.  EXAM: CT HEAD WITHOUT CONTRAST  TECHNIQUE: Contiguous axial images were obtained from the base of the skull through the vertex without intravenous contrast.  COMPARISON:  None  FINDINGS:  Sinuses/Soft tissues: Surgical changes about the globes. Mucosal thickening of the left maxillary sinus. No skull fracture. Clear mastoid air cells.  Intracranial: Expected cerebral  atrophy. Moderate low density in the periventricular white matter likely related to small vessel disease. No mass lesion, hemorrhage, hydrocephalus, acute infarct, intra-axial, or extra-axial fluid collection.  IMPRESSION: 1.  No acute intracranial abnormality. 2.  Cerebral atrophy and small vessel ischemic change. 3. Mild sinus disease.   Electronically Signed   By: Jeronimo Greaves M.D.   On: 02/21/2015 17:56   Ct Head Wo Contrast  02/05/2015   CLINICAL DATA:  Fall.  Hit head.  EXAM: CT HEAD WITHOUT CONTRAST  TECHNIQUE: Contiguous axial images were obtained from the base of the skull through the vertex without intravenous contrast.  COMPARISON:  None.  FINDINGS: There is diffuse low attenuation throughout the subcortical and periventricular white matter compatible with chronic microvascular disease. Prominence of the sulci and the ventricles noted consistent with brain atrophy. No abnormal extra-axial fluid collections, intracranial hemorrhage or mass noted. There is mild mucosal thickening involving the maxillary sinuses. The paranasal sinuses are clear. The mastoid air cells appear clear. The calvarium is intact.  IMPRESSION: 1. No acute intracranial abnormality. 2. Chronic microvascular disease and brain atrophy.   Electronically Signed   By: Signa Kell M.D.   On: 02/05/2015 08:26   Ct Chest W Contrast  02/05/2015   CLINICAL DATA:  Fall.  Right rib pain  EXAM: CT CHEST WITH CONTRAST  TECHNIQUE: Multidetector CT imaging of the chest was performed during intravenous contrast administration.  CONTRAST:  OMNIPAQUE IOHEXOL 300 MG/ML  SOLN  COMPARISON:  Chest x-ray 02/05/2015.  No prior chest CT for review  FINDINGS: Left upper lobe mass lesion measures 3.5 x 11.6 cm. This has spiculated margins and is worrisome for carcinoma.  Additional spiculated mass in the superior segment left lower lobe adjacent to the major fissure measures approximately 23 x 13 mm. These lesions are difficult to see on chest x-ray due to their location. Both lesions are worrisome for carcinoma. No mediastinal adenopathy.  Chronic lung disease with probable interstitial scarring on the right.  Right lower lobe airspace disease may represent atelectasis or pneumonia. Small right effusion. Mild left lower lobe atelectasis.  Coronary artery calcification. Heart size normal. Atherosclerotic calcification aortic arch without aneurysm or dissection. No central pulmonary embolus.  Chronic right rib fractures with deformity. No acute right rib fracture. Left rib fractures appear subacute with callus formation. No acute left rib fracture.  Negative for mass in the upper abdomen.  Mild compression fracture at T11 of indeterminate age.  IMPRESSION: Spiculated mass left upper lobe and superior segment left lower lobe. These findings are concerning for carcinoma of the lung. Pneumonia and fibrosis considered less likely. PET-CT recommended for further evaluation.  Probable chronic lung disease with interstitial scarring on the right. Right lower lobe atelectasis/ infiltrate. Small right effusion. Pneumonia not excluded on the right.  Bilateral rib fractures appear subacute to chronic. No acute rib fracture.  Mild compression fracture T11, possibly acute.  These results were called by telephone at the time of interpretation on 02/05/2015 at 11:18 am to [facility] , who verbally acknowledged these results.   Electronically Signed   By: Marlan Palau M.D.   On: 02/05/2015 11:19   Ct Cervical Spine Wo Contrast  02/05/2015   CLINICAL DATA:  81 year old with recent fall. Head laceration. Complains of pain in the upper back.  EXAM: CT CERVICAL SPINE WITHOUT CONTRAST  TECHNIQUE: Multidetector CT imaging of the cervical spine was performed without intravenous contrast.  Multiplanar CT image reconstructions were also generated.  COMPARISON:  01/25/2008  FINDINGS: Negative for an acute fracture dislocation in the cervical spine. There is mild sclerosis and deformity of the T3 superior endplate which appears new since 2009. There is mild retrolisthesis at C3-C4 with disc space narrowing. Bilateral facet arthropathy. No significant soft tissue edema. No significant neck lymphadenopathy.  Images of the upper lungs demonstrate a 2.7 x 1.1 cm opacity in the medial left upper lobe. This opacity contains some scattered calcifications. There are additional patchy opacities and calcifications in the right upper lung. There is also a small focal opacity in the medial superior segment of the left lower lobe measuring up to 1.5 cm.  IMPRESSION: Degenerative changes in cervical spine without acute bone abnormality.  Mild compression deformity of the T3 vertebral body of unknown age. Legrand Rams an old injury based on the sclerosis but recommend clinical correlation.  There are focal parenchymal densities or lesions in the left upper lung, particularly in the left upper lobe. These appear to be new since 2009. Findings are nonspecific but a neoplastic process cannot be excluded. Recommend further characterization with a post contrast chest CT.  These results were called by telephone at the time of interpretation on 02/05/2015 at 8:42 am to [facility], PA-C, who verbally acknowledged these results.   Electronically Signed   By: Richarda Overlie M.D.   On: 02/05/2015 08:52   Dg Chest Port 1 View  02/24/2015   CLINICAL DATA:  Increased tracheal secretions, acute onset. Initial encounter.  EXAM: PORTABLE CHEST - 1 VIEW  COMPARISON:  Chest radiograph performed 02/21/2015  FINDINGS: The lungs remain hypoexpanded. Patchy bibasilar airspace opacities, right greater than left, raise concern for pneumonia, given the patient's symptoms. A small right pleural effusion is noted. No pneumothorax is seen.  The  cardiomediastinal silhouette remains normal in size. No acute osseous abnormalities are identified.  IMPRESSION: Lungs hypoexpanded. Patchy bibasilar airspace opacities, right greater than left, raise concern for pneumonia given the patient's symptoms. Small right pleural effusion noted.   Electronically Signed   By: Roanna Raider M.D.   On: 02/24/2015 00:01   Dg Abd Portable 1v  02/24/2015   CLINICAL DATA:  NG tube placement  EXAM: PORTABLE ABDOMEN - 1 VIEW  COMPARISON:  No recent similar comparison exam  FINDINGS: Feeding tube tip terminates over the expected location of the gastric cardia. Right greater than left basilar lung field opacification is partly visualized. Normal bowel gas pattern in visualized aspects with bowel retained oral contrast.  IMPRESSION: Feeding tube tip terminates over the expected location of the gastric cardia.   Electronically Signed   By: Christiana Pellant M.D.   On: 02/24/2015 19:44   Dg Swallowing Func-speech Pathology  02/24/2015    Objective Swallowing Evaluation:   Modified Barium Swallow Patient Details  Name: Kurt Navarro MRN: [id number] Date of Birth: 22-Nov-1926  Today's Date: 02/24/2015 Time: SLP Start Time (ACUTE ONLY): 1305-SLP Stop Time (ACUTE ONLY): 1328 SLP Time Calculation (min) (ACUTE ONLY): 23 min  Past Medical History:  Past Medical History  Diagnosis Date  . Diabetes mellitus without complication   . Hypertension   . Hypercholesteremia   . Heart murmur   . Frequent urination   . BPH (benign prostatic hyperplasia)   . GERD (gastroesophageal reflux disease)   . Coronary artery disease     medical management with aspirin  . Glaucoma    Past Surgical History:  Past Surgical History  Procedure Laterality Date  . Leg surgery  5 years ago  Rod in right leg  . Back surgery  years ago    lower  . Hernia repair  years ago  . Cystoscopy with retrograde pyelogram, ureteroscopy and stent placement  Bilateral 12/12/2014    Procedure: CYSTO WITH BILATERAL RETROGRADE/BIOPSY WITH   FULGURATION/HYDRODISTENSION OF BLADDER;  Surgeon: Bjorn Pippin, MD;   Location: [facility];  Service: Urology;  Laterality: Bilateral;   HPI:  Other Pertinent Information: 81 y.o. male with a past medical history of  type 2 diabetes, hypertension, hyperlipidemia, heart murmur, BPH, GERD,  hernia repair, coronary artery disease, glaucoma who was brought in by his  son to the emergency department due to progressive weakness associated  with 4 falls in the last 9 days and confusion. CXR Patchy bibasilar  airspace opacities, right greater than left, raise concern for pneumonia  given the patient's symptoms. Small right pleural effusion noted. Head CT  negative. Found to have acute encephalopathy due to UTI.  No Data Recorded  Assessment / Plan / Recommendation CHL IP CLINICAL IMPRESSIONS 02/24/2015  Therapy Diagnosis Mild oral phase dysphagia;Moderate pharyngeal phase  dysphagia;Severe pharyngeal phase dysphagia  Clinical Impression Delayed oral transit and cohesion across  consistencies. Moderate-severe sensorimotor based pharyngeal dysphagia and  decreased sensation with delayed swallow initiation. Laryngeal penetration  and aspiration (silent) across all consistencies assessed during initial  swallow and in attempts to clear mod-max vallecular and moderate pyriform  sinus residue. Inadequate sensation to residue and unable to safety reduce  with max cues for multiple swallows. Pt at significant aspiration risk at  present wth clinical findings and current confusion, recommend NPO,  temporary means of nutrition and oral care. ST will follow.        CHL IP TREATMENT RECOMMENDATION 02/24/2015  Treatment Recommendations Therapy as outlined in treatment plan below     CHL IP DIET RECOMMENDATION 02/24/2015  SLP Diet Recommendations NPO;Alternative means - temporary  Liquid Administration via (None)  Medication Administration Via alternative means  Compensations (None)  Postural Changes and/or Swallow Maneuvers (None)     CHL IP  OTHER RECOMMENDATIONS 02/24/2015  Recommended Consults (None)  Oral Care Recommendations Oral care QID  Other Recommendations (None)     No flowsheet data found.   CHL IP FREQUENCY AND DURATION 02/24/2015  Speech Therapy Frequency (ACUTE ONLY) min 2x/week  Treatment Duration 2 weeks     Pertinent Vitals/Pain none    SLP Swallow Goals No flowsheet data found.  No flowsheet data found.    CHL IP REASON FOR REFERRAL 02/24/2015  Reason for Referral Objectively evaluate swallowing function               No flowsheet data found.         Royce Macadamia 02/24/2015, 2:13 PM   Breck Coons Lonell Face.Ed CCC-SLP Pager [phone]      Dg Hip Unilat  With Pelvis 2-3 Views Right  02/21/2015   CLINICAL DATA:  Multiple falls over the past 10 days. Right hip pain and bruising. History of prior right intertrochanteric fracture. Initial encounter.  EXAM: DG HIP (WITH OR WITHOUT PELVIS) 2-3V RIGHT  COMPARISON:  Single view of the abdomen 12/26/2014.  FINDINGS: A hip screw and long IM nail are in place for fixation of a healed right intertrochanteric fracture. No acute fracture is identified. No notable degenerative change is identified. No focal bony lesion is seen.  IMPRESSION: No acute abnormality. Healed right intertrochanteric fracture with fixation hardware in place.   Electronically Signed   By: Drusilla Kanner  M.D.   On: 02/21/2015 15:45    CBC  Recent Labs Lab 02/21/15 1719 02/22/15 0531 02/23/15 0600  WBC 7.7 6.7 8.4  HGB 11.7* 10.8* 11.6*  HCT 34.3* 31.6* 33.9*  PLT 314 305 334  MCV 89.8 89.5 90.4  MCH 30.6 30.6 30.9  MCHC 34.1 34.2 34.2  RDW 14.8 14.9 14.5  LYMPHSABS 0.9  --   --   MONOABS 0.7  --   --   EOSABS 0.1  --   --   BASOSABS 0.0  --   --     Chemistries   Recent Labs Lab 02/21/15 1719 02/22/15 0531 02/23/15 0600 02/25/15 0720  NA 132* 132* 132* 130*  K 3.9 3.8 4.1 3.9  CL 96* 98* 95* 97*  CO2 27 26 29 24   GLUCOSE 107* 116* 137* 121*  BUN 15 10 8 10   CREATININE 0.77 0.81 0.87  0.86  CALCIUM 8.9 8.6* 9.3 8.8*  AST 19 16  --   --   ALT 15* 14*  --   --   ALKPHOS 66 61  --   --   BILITOT 0.5 0.5  --   --    ------------------------------------------------------------------------------------------------------------------ estimated creatinine clearance is 53.6 mL/min (by C-G formula based on Cr of 0.86). ------------------------------------------------------------------------------------------------------------------ No results for input(s): HGBA1C in the last 72 hours. ------------------------------------------------------------------------------------------------------------------ No results for input(s): CHOL, HDL, LDLCALC, TRIG, CHOLHDL, LDLDIRECT in the last 72 hours. ------------------------------------------------------------------------------------------------------------------ No results for input(s): TSH, T4TOTAL, T3FREE, THYROIDAB in the last 72 hours.  Invalid input(s): FREET3 ------------------------------------------------------------------------------------------------------------------ No results for input(s): VITAMINB12, FOLATE, FERRITIN, TIBC, IRON, RETICCTPCT in the last 72 hours.  Coagulation profile No results for input(s): INR, PROTIME in the last 168 hours.  No results for input(s): DDIMER in the last 72 hours.  Cardiac Enzymes No results for input(s): CKMB, TROPONINI, MYOGLOBIN in the last 168 hours.  Invalid input(s): CK ------------------------------------------------------------------------------------------------------------------ Invalid input(s): POCBNP    Lodie Waheed D.O. on 02/25/2015 at 11:34 AM  Between 7am to 7pm - Pager - [phone]  After 7pm go to [email or web address] - password [email or web address]  And look for the night coverage person covering for me after hours  [facility] Office  [phone]

## 2015-02-25 NOTE — Clinical Documentation Improvement (Signed)
Hospitalist  CMS requires that the attending physician document the presence of pressure ulcers including the Location, Stage and whether or not present on admission (POA). Location, staging and POA information cannot be coded from staff nursing notes.  If you agree with the nursing assessment of the patient's sacral pressure ulcer, please document the location, stage and whether or not POA in the progress notes and discharge summary.  Clinical Information: "Pressure ulcer, continue local care" is documented in the H&P. Nursing flowsheets include the following information dated 02/21/15 at 2040:  "Pressure Ulcer Sacrum, Stage II, POA - yes".   Please exercise your independent, professional judgment when responding. A specific answer is not anticipated or expected.   Thank You,  Jerral Ralph  RN BSN CCDS [phone] Health Information Management [place]

## 2015-02-25 NOTE — Clinical Documentation Improvement (Signed)
Hospitalist  Please clarify if the following diagnosis, "Pneumonia" was:   Present at the time of admission (POA)  NOT present at the time of admission and it developed during the inpatient stay  Unable to clinically determine whether the condition was present on admission.   Supporting Information: "Pneumonia,  "CXR 8/28: Has a bi-basilar airspace opacities, concerning for pneumonia, continue vancomycin and Zosyn" is documented in Dr. Cristopher Estimable progress note dated 02/24/15.   Please exercise your independent, professional judgment when responding. A specific answer is not anticipated or expected.   Thank You,  Jerral Ralph  RN BSN CCDS [phone] [facility] [place]

## 2015-02-25 NOTE — Progress Notes (Signed)
Pt & wife asked if it as time to call hospice. I told them it's too early, until we know cause for the swallow difficulty, and whether it will return - and that even then there are options like a PEG tube for feeding long term. They asked me to chart that they WOULD want tube feeds, NOT comfort care if it came to that point later.  I told them he is talking more clearly this afternoon, and it is too early to know what will be needed.

## 2015-02-25 NOTE — Progress Notes (Signed)
Initial Nutrition Assessment  DOCUMENTATION CODES:   Not applicable  INTERVENTION:   Initiate Jevity 1.2 @ 20 ml/hr via NGT and increase by 10 ml every 4 hours to goal rate of 70 ml/hr.   Tube feeding regimen provides 2016 kcal (100% of needs), 93 grams of protein, and 1356 ml of H2O.   NUTRITION DIAGNOSIS:   Inadequate oral intake related to dysphagia as evidenced by NPO status.  GOAL:   Patient will meet greater than or equal to 90% of their needs  MONITOR:   Labs, Weight trends, Skin, I & O's  REASON FOR ASSESSMENT:   Consult Enteral/tube feeding initiation and management  ASSESSMENT:   Kurt Navarro is a 81 y.o. male with a past medical history of type 2 diabetes, hypertension, hyperlipidemia, heart murmur, BPH, GERD, coronary artery disease, glaucoma who was brought in by his son to the emergency department due to progressive weakness associated with 4 falls in the last 9 days and confusion. Apparently the patient has not hit his head, he hasn't have fever or chills, but he is usually alert oriented 3 and has been unable to tell the current date even down to the year, as well as being confused with other events for the past week plus.He is he is oriented to place and is currently in no acute distress. He is unable to provide further details on his history.  Pt admitted with acute encephalopathy related to UTI and pneumonia.   Spoke with SLP, who confirms that pt failed MBSS yesterday and plan to initiate TF. SLP reports potential for pt to regain swallow function.   Hx obtained by pt wife at bedside. She reports that pt has not lost any weight. UBW around 144#, which is consistent with wt hx. She reports pt had a good appetite PTA and was eating and swallowing well PTA; swallowing troubles developed during hospital admission.   Nutrition-Focused physical exam completed. Findings are mild to moderate fat depletion, mild to moderate muscle depletion, and no edema. RD  suspects depletion is related to limited mobility and advanced age, given nutrition hx.   NGT has been placed and x-ray confirmed placement in stomach on 02/24/15.   PT recommending SNF placement.   Labs reviewed: Na: 130 (on IV supplementation)  Diet Order:  Diet Carb Modified Fluid consistency:: Thin; Room service appropriate?: Yes  Skin:  Wound (see comment) (stage II sacrum, mid back skin tear)  Last BM:  02/23/15  Height:   Ht Readings from Last 1 Encounters:  02/21/15  (1.651 m)    Weight:   Wt Readings from Last 1 Encounters:  02/21/15 144 lb 2.9 oz (65.4 kg)    Ideal Body Weight:  61.8 kg  BMI:  Body mass index is 23.99 kg/(m^2).  Estimated Nutritional Needs:   Kcal:  1900-2100  Protein:  85-100 grams  Fluid:  >1.9 L  EDUCATION NEEDS:   No education needs identified at this time  Ethelean Colla A. Mayford Knife, RD, LDN, CDE Pager: [phone] After hours Pager: [phone]

## 2015-02-25 NOTE — Progress Notes (Signed)
Pt was not able to tolerate laying flat for the MRI d/t resp secretions.   However, pt did make progress today, producing less secretions and overall presenting stronger with voice, cough, and general demeanor as the day progressed.

## 2015-02-26 DIAGNOSIS — R627 Adult failure to thrive: Secondary | ICD-10-CM | POA: Insufficient documentation

## 2015-02-26 DIAGNOSIS — J398 Other specified diseases of upper respiratory tract: Secondary | ICD-10-CM

## 2015-02-26 DIAGNOSIS — R1314 Dysphagia, pharyngoesophageal phase: Secondary | ICD-10-CM | POA: Insufficient documentation

## 2015-02-26 LAB — LEGIONELLA ANTIGEN, URINE

## 2015-02-26 LAB — BASIC METABOLIC PANEL
ANION GAP: 9 (ref 5–15)
BUN: 13 mg/dL (ref 6–20)
CO2: 26 mmol/L (ref 22–32)
Calcium: 8.5 mg/dL — ABNORMAL LOW (ref 8.9–10.3)
Chloride: 97 mmol/L — ABNORMAL LOW (ref 101–111)
Creatinine, Ser: 0.93 mg/dL (ref 0.61–1.24)
Glucose, Bld: 203 mg/dL — ABNORMAL HIGH (ref 65–99)
POTASSIUM: 3.5 mmol/L (ref 3.5–5.1)
SODIUM: 132 mmol/L — AB (ref 135–145)

## 2015-02-26 LAB — CBC
HCT: 35.1 % — ABNORMAL LOW (ref 39.0–52.0)
Hemoglobin: 11.8 g/dL — ABNORMAL LOW (ref 13.0–17.0)
MCH: 30.3 pg (ref 26.0–34.0)
MCHC: 33.6 g/dL (ref 30.0–36.0)
MCV: 90 fL (ref 78.0–100.0)
PLATELETS: 316 10*3/uL (ref 150–400)
RBC: 3.9 MIL/uL — AB (ref 4.22–5.81)
RDW: 14.6 % (ref 11.5–15.5)
WBC: 9.7 10*3/uL (ref 4.0–10.5)

## 2015-02-26 LAB — GLUCOSE, CAPILLARY
GLUCOSE-CAPILLARY: 211 mg/dL — AB (ref 65–99)
GLUCOSE-CAPILLARY: 235 mg/dL — AB (ref 65–99)
GLUCOSE-CAPILLARY: 120 mg/dL — AB (ref 65–99)
Glucose-Capillary: 162 mg/dL — ABNORMAL HIGH (ref 65–99)
Glucose-Capillary: 196 mg/dL — ABNORMAL HIGH (ref 65–99)
Glucose-Capillary: 158 mg/dL — ABNORMAL HIGH (ref 65–99)

## 2015-02-26 LAB — TSH: TSH: 2.867 u[IU]/mL (ref 0.350–4.500)

## 2015-02-26 NOTE — Progress Notes (Signed)
[facility]                                                                              Patient Demographics  Kurt Navarro, is a 81 y.o. male, DOB - 03/19/1927, WUJ:[id number]  Admit date - 02/21/2015   Admitting Physician Bobette Mo, MD  Outpatient Primary MD for the patient is Thayer Headings, MD  LOS - 5   Chief Complaint  Patient presents with  . Fall      HPI 02/21/2015 by Dr. Sanda Klein Kurt Navarro is a 81 y.o. male with a past medical history of type 2 diabetes, hypertension, hyperlipidemia, heart murmur, BPH, GERD, coronary artery disease, glaucoma who was brought in by his son to the emergency department due to progressive weakness associated with 4 falls in the last 9 days and confusion. Apparently the patient has not hit his head, he hasn't have fever or chills, but he is usually alert oriented 3 and has been unable to tell the current date even down to the year, as well as being confused with other events for the past week plus.He is he is oriented to place and is currently in no acute distress. He is unable to provide further details on his history.  Interim history   Frail elderly male laying in bed, with weak cough, not able to clear own secretion, but fairly coherent, following commend, know he is at [facility], know it is the last day of august, not able to state the correct year, possible baseline.  Not able to lay flat for MRi brain, will repeat ct head. Family not in room,  Remain npo, not able to perform modified swallow study due to not able to clear own secretion,  Patient states not wanting peg tube, no ventilator but ok to cpr/defribllation. Speech therapy in room during above conversation.  Assessment & Plan   Acute encephalopathy secondary UTI/Pneumonia -CT head: No acute intracranial abnormality -CXR 8/26: Decreased lung volumes with increased bibasilar atelectasis, questionable small right pleural effusion -UA: WBC TNTC, Large leukocytes    -Urine culture >100K Enterococcus -Patient currently afebrile with no leukocytosis -CXR 8/28: Has a bi-basilar airspace opacities, concerning for pneumonia -question whether pneumonia secondary to aspiration. -Continue vancomycin and Zosyn -Strep pneumonia urine antigen negative, legionella urine antigen negative  Dysphagia -Speech consulted and appreciated. Patient had barium swallow and failed. -pended tube inserted, nutrition consult for tube feeds -MRI of the head pending, not able to lay flat due to secretions, will re attempt, consider ct head if not able to tolerate mri -discussed with patient and son over the phone, patient currently states he does not want peg tube placed if not able to eat.   Fall/physical deconditioning -family reported progressive weakness and multiple falls in the last month PT consulted and appreciated, recommended SNF -social work consulted for placement  Diabetes mellitus, type 2 -Metformin held -Continue insulin sliding scale CBG monitoring  Hyperlipidemia -Continue statin  Essential hypertension -Continue Cozaar  BPH -Continue Proscar, Flomax  Hypothyroidism -Continue Synthroid  Code Status: partial, no intubation  Family Communication: None at bedside, son via phone (Cell [phone])  Disposition Plan:   Pending SNF.  Pending improvement in swallow function  and MRI.  Time Spent in minutes   35 minutes  Procedures  Barium swallow  Consults   None  DVT Prophylaxis  Lovenox  Lab Results  Component Value Date   PLT 316 02/26/2015    Medications  Scheduled Meds: . aspirin  325 mg Per Tube Daily  . enoxaparin (LOVENOX) injection  40 mg Subcutaneous Q24H  . ferrous sulfate  300 mg Per Tube BID WC  . insulin aspart  0-5 Units Subcutaneous QHS  . insulin aspart  0-9 Units Subcutaneous TID WC  . latanoprost  1 drop Both Eyes QHS  . levothyroxine  50 mcg Per Tube QAC breakfast  . losartan  100 mg Per Tube Daily  .  multivitamin with minerals  1 tablet Per Tube Daily  . pantoprazole sodium  40 mg Per Tube Daily  . piperacillin-tazobactam (ZOSYN)  IV  3.375 g Intravenous Q8H  . pravastatin  20 mg Per Tube QPM  . vancomycin  1,250 mg Intravenous Q24H   Continuous Infusions: . feeding supplement (JEVITY 1.2 CAL) 1,000 mL (02/26/15 0901)   PRN Meds:.ondansetron **OR** ondansetron (ZOFRAN) IV  Antibiotics    Anti-infectives    Start     Dose/Rate Route Frequency Ordered Stop   02/24/15 0600  piperacillin-tazobactam (ZOSYN) IVPB 3.375 g     3.375 g 12.5 mL/hr over 240 Minutes Intravenous Every 8 hours 02/24/15 0027     02/24/15 0030  piperacillin-tazobactam (ZOSYN) IVPB 3.375 g     3.375 g 100 mL/hr over 30 Minutes Intravenous  Once 02/24/15 0027 02/24/15 0130   02/23/15 1700  vancomycin (VANCOCIN) 1,250 mg in sodium chloride 0.9 % 250 mL IVPB     1,250 mg 166.7 mL/hr over 90 Minutes Intravenous Every 24 hours 02/23/15 1556     02/22/15 1630  cefTRIAXone (ROCEPHIN) 1 g in dextrose 5 % 50 mL IVPB  Status:  Discontinued     1 g 100 mL/hr over 30 Minutes Intravenous Every 24 hours 02/21/15 2054 02/23/15 1545   02/21/15 1630  cefTRIAXone (ROCEPHIN) 1 g in dextrose 5 % 50 mL IVPB     1 g 100 mL/hr over 30 Minutes Intravenous  Once 02/21/15 1626 02/21/15 1822      Subjective:   [name] seen and examined today.  States he is feeling "ok" this morning. Denies chest pain, shortness of breath, abdominal pain.   Objective:   Filed Vitals:   02/25/15 0544 02/25/15 1304 02/25/15 2239 02/26/15 0707  BP: 166/71 161/64 143/71 171/66  Pulse: 98 92 89 81  Temp: 98.8 F (37.1 C) 98.3 F (36.8 C) 99.4 F (37.4 C) 97.5 F (36.4 C)  TempSrc: Oral Oral Oral Oral  Resp: 18 18 24 16   Height:      Weight:    133 lb 9.6 oz (60.6 kg)  SpO2: 95% 96% 99% 94%    Wt Readings from Last 3 Encounters:  02/26/15 133 lb 9.6 oz (60.6 kg)  12/12/14 146 lb (66.225 kg)  12/11/14 146 lb (66.225 kg)      Intake/Output Summary (Last 24 hours) at 02/26/15 1031 Last data filed at 02/26/15 0757  Gross per 24 hour  Intake 299.33 ml  Output    875 ml  Net -575.67 ml    Exam  General: frail, elderly, chronically ill-appearing  HEENT: NCAT, mucous membranes moist.   Cardiovascular: S1 S2 auscultated, RRR  Respiratory: Diminished breath sounds, Course at bases.  Abdomen: Soft, nontender, nondistended, + bowel sounds  Extremities: warm  dry without cyanosis clubbing or edema  Neuro: AAOx3,  Nonfocal. Able to lift legs against gravity, upper extremity strength 4/5.  Skin: bruising and ecchymosis noted on the extremities.  Data Review   Micro Results Recent Results (from the past 240 hour(s))  Urine culture     Status: None   Collection Time: 02/21/15  5:10 PM  Result Value Ref Range Status   Specimen Description URINE, CLEAN CATCH  Final   Special Requests Immunocompromised  Final   Culture >=100,000 COLONIES/mL ENTEROCOCCUS SPECIES  Final   Report Status 02/24/2015 FINAL  Final   Organism ID, Bacteria ENTEROCOCCUS SPECIES  Final      Susceptibility   Enterococcus species - MIC*    AMPICILLIN <=2 RESISTANT Resistant     LEVOFLOXACIN >=8 RESISTANT Resistant     NITROFURANTOIN <=16 SENSITIVE Sensitive     VANCOMYCIN 1 SENSITIVE Sensitive     * >=100,000 COLONIES/mL ENTEROCOCCUS SPECIES    Radiology Reports Dg Chest 2 View  02/21/2015   CLINICAL DATA:  Multiple falls yesterday, increased confusion, incontinence, decreased appetite, history hypertension, diabetes mellitus, coronary artery disease, former smoker  EXAM: CHEST  2 VIEW  COMPARISON:  02/13/2015  FINDINGS: Low lung volumes.  Upper normal heart size.  Atherosclerotic calcification aorta.  Pulmonary vascular congestion.  Bibasilar atelectasis, increased.  Apices clear.  Questionable small RIGHT pleural effusion.  No pneumothorax.  Bones demineralized.  IMPRESSION: Decreased lung volumes with increased bibasilar  atelectasis and questionable small RIGHT pleural effusion.   Electronically Signed   By: Ulyses Southward M.D.   On: 02/21/2015 18:17   Dg Chest 2 View  02/05/2015   CLINICAL DATA:  Larey Seat. Week and soreness all over. Tenderness in the back and right shoulder area.  EXAM: CHEST  2 VIEW  COMPARISON:  04/28/2014  FINDINGS: There are old right rib fractures. Age-indeterminate fractures involving the left fifth and sixth ribs. Densities at the left lung base could represent atelectasis or scarring. Heart size is grossly normal with atherosclerotic calcifications at the aorta. Negative for a pneumothorax. Again noted is a compression fracture in the upper lumbar spine.  IMPRESSION: Low lung volumes bilaterally.  Bilateral rib fractures. Left rib fractures are age indeterminate and new from the prior examination.   Electronically Signed   By: Richarda Overlie M.D.   On: 02/05/2015 07:55   Dg Lumbar Spine Complete  02/05/2015   CLINICAL DATA:  Injury, fall in the bathroom  EXAM: LUMBAR SPINE - COMPLETE 4+ VIEW  COMPARISON:  01/03/2015  FINDINGS: Five views of the lumbar spine submitted. There is diffuse osteopenia. Atherosclerotic calcifications of abdominal aorta and iliac arteries. Again noted compression deformity upper endplate of L1 vertebral body is stable from prior exam. There is disc space flattening with mild anterior spurring at T12-L1 and L1-L2 level. Mild anterior spurring upper endplate of L3 and L4 vertebral body. Minimal disc space flattening at L5-S1 level. There is mild lower lumbar levoscoliosis.  IMPRESSION: No acute fracture or subluxation. Mild lower lumbar levoscoliosis. Degenerative changes as described above. Stable compression fracture upper endplate of L1 vertebral body. Diffuse osteopenia.   Electronically Signed   By: Natasha Mead M.D.   On: 02/05/2015 08:05   Dg Shoulder Right  02/05/2015   CLINICAL DATA:  Larey Seat with weakness. Tenderness in the back and right shoulder area.  EXAM: RIGHT SHOULDER - 2+  VIEW  COMPARISON:  04/28/2014  FINDINGS: There are old right rib fractures. The right shoulder is located without a fracture.  Right AC joint is intact. Evidence for spurring and degenerative changes at the right glenohumeral joint.  IMPRESSION: No acute bone abnormality in the right shoulder.   Electronically Signed   By: Richarda Overlie M.D.   On: 02/05/2015 08:00   Ct Head Wo Contrast  02/21/2015   CLINICAL DATA:  Multiple falls with 2 yesterday. Confusion. Incontinence. Decreased appetite.  EXAM: CT HEAD WITHOUT CONTRAST  TECHNIQUE: Contiguous axial images were obtained from the base of the skull through the vertex without intravenous contrast.  COMPARISON:  None  FINDINGS: Sinuses/Soft tissues: Surgical changes about the globes. Mucosal thickening of the left maxillary sinus. No skull fracture. Clear mastoid air cells.  Intracranial: Expected cerebral atrophy. Moderate low density in the periventricular white matter likely related to small vessel disease. No mass lesion, hemorrhage, hydrocephalus, acute infarct, intra-axial, or extra-axial fluid collection.  IMPRESSION: 1.  No acute intracranial abnormality. 2.  Cerebral atrophy and small vessel ischemic change. 3. Mild sinus disease.   Electronically Signed   By: Jeronimo Greaves M.D.   On: 02/21/2015 17:56   Ct Head Wo Contrast  02/05/2015   CLINICAL DATA:  Fall.  Hit head.  EXAM: CT HEAD WITHOUT CONTRAST  TECHNIQUE: Contiguous axial images were obtained from the base of the skull through the vertex without intravenous contrast.  COMPARISON:  None.  FINDINGS: There is diffuse low attenuation throughout the subcortical and periventricular white matter compatible with chronic microvascular disease. Prominence of the sulci and the ventricles noted consistent with brain atrophy. No abnormal extra-axial fluid collections, intracranial hemorrhage or mass noted. There is mild mucosal thickening involving the maxillary sinuses. The paranasal sinuses are clear. The  mastoid air cells appear clear. The calvarium is intact.  IMPRESSION: 1. No acute intracranial abnormality. 2. Chronic microvascular disease and brain atrophy.   Electronically Signed   By: Signa Kell M.D.   On: 02/05/2015 08:26   Ct Chest W Contrast  02/05/2015   CLINICAL DATA:  Fall.  Right rib pain  EXAM: CT CHEST WITH CONTRAST  TECHNIQUE: Multidetector CT imaging of the chest was performed during intravenous contrast administration.  CONTRAST:  OMNIPAQUE IOHEXOL 300 MG/ML  SOLN  COMPARISON:  Chest x-ray 02/05/2015.  No prior chest CT for review  FINDINGS: Left upper lobe mass lesion measures 3.5 x 11.6 cm. This has spiculated margins and is worrisome for carcinoma. Additional spiculated mass in the superior segment left lower lobe adjacent to the major fissure measures approximately 23 x 13 mm. These lesions are difficult to see on chest x-ray due to their location. Both lesions are worrisome for carcinoma. No mediastinal adenopathy.  Chronic lung disease with probable interstitial scarring on the right.  Right lower lobe airspace disease may represent atelectasis or pneumonia. Small right effusion. Mild left lower lobe atelectasis.  Coronary artery calcification. Heart size normal. Atherosclerotic calcification aortic arch without aneurysm or dissection. No central pulmonary embolus.  Chronic right rib fractures with deformity. No acute right rib fracture. Left rib fractures appear subacute with callus formation. No acute left rib fracture.  Negative for mass in the upper abdomen.  Mild compression fracture at T11 of indeterminate age.  IMPRESSION: Spiculated mass left upper lobe and superior segment left lower lobe. These findings are concerning for carcinoma of the lung. Pneumonia and fibrosis considered less likely. PET-CT recommended for further evaluation.  Probable chronic lung disease with interstitial scarring on the right. Right lower lobe atelectasis/ infiltrate. Small right effusion.  Pneumonia not excluded on the  right.  Bilateral rib fractures appear subacute to chronic. No acute rib fracture.  Mild compression fracture T11, possibly acute.  These results were called by telephone at the time of interpretation on 02/05/2015 at 11:18 am to [facility] , who verbally acknowledged these results.   Electronically Signed   By: Marlan Palau M.D.   On: 02/05/2015 11:19   Ct Cervical Spine Wo Contrast  02/05/2015   CLINICAL DATA:  81 year old with recent fall. Head laceration. Complains of pain in the upper back.  EXAM: CT CERVICAL SPINE WITHOUT CONTRAST  TECHNIQUE: Multidetector CT imaging of the cervical spine was performed without intravenous contrast. Multiplanar CT image reconstructions were also generated.  COMPARISON:  01/25/2008  FINDINGS: Negative for an acute fracture dislocation in the cervical spine. There is mild sclerosis and deformity of the T3 superior endplate which appears new since 2009. There is mild retrolisthesis at C3-C4 with disc space narrowing. Bilateral facet arthropathy. No significant soft tissue edema. No significant neck lymphadenopathy.  Images of the upper lungs demonstrate a 2.7 x 1.1 cm opacity in the medial left upper lobe. This opacity contains some scattered calcifications. There are additional patchy opacities and calcifications in the right upper lung. There is also a small focal opacity in the medial superior segment of the left lower lobe measuring up to 1.5 cm.  IMPRESSION: Degenerative changes in cervical spine without acute bone abnormality.  Mild compression deformity of the T3 vertebral body of unknown age. Legrand Rams an old injury based on the sclerosis but recommend clinical correlation.  There are focal parenchymal densities or lesions in the left upper lung, particularly in the left upper lobe. These appear to be new since 2009. Findings are nonspecific but a neoplastic process cannot be excluded. Recommend further characterization with a post  contrast chest CT.  These results were called by telephone at the time of interpretation on 02/05/2015 at 8:42 am to [facility], PA-C, who verbally acknowledged these results.   Electronically Signed   By: Richarda Overlie M.D.   On: 02/05/2015 08:52   Dg Chest Port 1 View  02/24/2015   CLINICAL DATA:  Increased tracheal secretions, acute onset. Initial encounter.  EXAM: PORTABLE CHEST - 1 VIEW  COMPARISON:  Chest radiograph performed 02/21/2015  FINDINGS: The lungs remain hypoexpanded. Patchy bibasilar airspace opacities, right greater than left, raise concern for pneumonia, given the patient's symptoms. A small right pleural effusion is noted. No pneumothorax is seen.  The cardiomediastinal silhouette remains normal in size. No acute osseous abnormalities are identified.  IMPRESSION: Lungs hypoexpanded. Patchy bibasilar airspace opacities, right greater than left, raise concern for pneumonia given the patient's symptoms. Small right pleural effusion noted.   Electronically Signed   By: Roanna Raider M.D.   On: 02/24/2015 00:01   Dg Abd Portable 1v  02/24/2015   CLINICAL DATA:  NG tube placement  EXAM: PORTABLE ABDOMEN - 1 VIEW  COMPARISON:  No recent similar comparison exam  FINDINGS: Feeding tube tip terminates over the expected location of the gastric cardia. Right greater than left basilar lung field opacification is partly visualized. Normal bowel gas pattern in visualized aspects with bowel retained oral contrast.  IMPRESSION: Feeding tube tip terminates over the expected location of the gastric cardia.   Electronically Signed   By: Christiana Pellant M.D.   On: 02/24/2015 19:44   Dg Swallowing Func-speech Pathology  02/24/2015    Objective Swallowing Evaluation:   Modified Barium Swallow Patient Details  Name: Kurt Navarro MRN:  [id number] Date of Birth: 1926/08/14  Today's Date: 02/24/2015 Time: SLP Start Time (ACUTE ONLY): 1305-SLP Stop Time (ACUTE ONLY): 1328 SLP Time Calculation (min) (ACUTE ONLY): 23  min  Past Medical History:  Past Medical History  Diagnosis Date  . Diabetes mellitus without complication   . Hypertension   . Hypercholesteremia   . Heart murmur   . Frequent urination   . BPH (benign prostatic hyperplasia)   . GERD (gastroesophageal reflux disease)   . Coronary artery disease     medical management with aspirin  . Glaucoma    Past Surgical History:  Past Surgical History  Procedure Laterality Date  . Leg surgery  5 years ago    Rod in right leg  . Back surgery  years ago    lower  . Hernia repair  years ago  . Cystoscopy with retrograde pyelogram, ureteroscopy and stent placement  Bilateral 12/12/2014    Procedure: CYSTO WITH BILATERAL RETROGRADE/BIOPSY WITH  FULGURATION/HYDRODISTENSION OF BLADDER;  Surgeon: Bjorn Pippin, MD;   Location: [facility];  Service: Urology;  Laterality: Bilateral;   HPI:  Other Pertinent Information: 81 y.o. male with a past medical history of  type 2 diabetes, hypertension, hyperlipidemia, heart murmur, BPH, GERD,  hernia repair, coronary artery disease, glaucoma who was brought in by his  son to the emergency department due to progressive weakness associated  with 4 falls in the last 9 days and confusion. CXR Patchy bibasilar  airspace opacities, right greater than left, raise concern for pneumonia  given the patient's symptoms. Small right pleural effusion noted. Head CT  negative. Found to have acute encephalopathy due to UTI.  No Data Recorded  Assessment / Plan / Recommendation CHL IP CLINICAL IMPRESSIONS 02/24/2015  Therapy Diagnosis Mild oral phase dysphagia;Moderate pharyngeal phase  dysphagia;Severe pharyngeal phase dysphagia  Clinical Impression Delayed oral transit and cohesion across  consistencies. Moderate-severe sensorimotor based pharyngeal dysphagia and  decreased sensation with delayed swallow initiation. Laryngeal penetration  and aspiration (silent) across all consistencies assessed during initial  swallow and in attempts to clear mod-max vallecular and  moderate pyriform  sinus residue. Inadequate sensation to residue and unable to safety reduce  with max cues for multiple swallows. Pt at significant aspiration risk at  present wth clinical findings and current confusion, recommend NPO,  temporary means of nutrition and oral care. ST will follow.        CHL IP TREATMENT RECOMMENDATION 02/24/2015  Treatment Recommendations Therapy as outlined in treatment plan below     CHL IP DIET RECOMMENDATION 02/24/2015  SLP Diet Recommendations NPO;Alternative means - temporary  Liquid Administration via (None)  Medication Administration Via alternative means  Compensations (None)  Postural Changes and/or Swallow Maneuvers (None)     CHL IP OTHER RECOMMENDATIONS 02/24/2015  Recommended Consults (None)  Oral Care Recommendations Oral care QID  Other Recommendations (None)     No flowsheet data found.   CHL IP FREQUENCY AND DURATION 02/24/2015  Speech Therapy Frequency (ACUTE ONLY) min 2x/week  Treatment Duration 2 weeks     Pertinent Vitals/Pain none    SLP Swallow Goals No flowsheet data found.  No flowsheet data found.    CHL IP REASON FOR REFERRAL 02/24/2015  Reason for Referral Objectively evaluate swallowing function               No flowsheet data found.         Royce Macadamia 02/24/2015, 2:13 PM   Breck Coons Lonell [employer] [phone]  Dg Hip Unilat  With Pelvis 2-3 Views Right  02/21/2015   CLINICAL DATA:  Multiple falls over the past 10 days. Right hip pain and bruising. History of prior right intertrochanteric fracture. Initial encounter.  EXAM: DG HIP (WITH OR WITHOUT PELVIS) 2-3V RIGHT  COMPARISON:  Single view of the abdomen 12/26/2014.  FINDINGS: A hip screw and long IM nail are in place for fixation of a healed right intertrochanteric fracture. No acute fracture is identified. No notable degenerative change is identified. No focal bony lesion is seen.  IMPRESSION: No acute abnormality. Healed right intertrochanteric fracture with fixation hardware  in place.   Electronically Signed   By: Drusilla Kanner M.D.   On: 02/21/2015 15:45    CBC  Recent Labs Lab 02/21/15 1719 02/22/15 0531 02/23/15 0600 02/26/15 0632  WBC 7.7 6.7 8.4 9.7  HGB 11.7* 10.8* 11.6* 11.8*  HCT 34.3* 31.6* 33.9* 35.1*  PLT 314 305 334 316  MCV 89.8 89.5 90.4 90.0  MCH 30.6 30.6 30.9 30.3  MCHC 34.1 34.2 34.2 33.6  RDW 14.8 14.9 14.5 14.6  LYMPHSABS 0.9  --   --   --   MONOABS 0.7  --   --   --   EOSABS 0.1  --   --   --   BASOSABS 0.0  --   --   --     Chemistries   Recent Labs Lab 02/21/15 1719 02/22/15 0531 02/23/15 0600 02/25/15 0720 02/26/15 0632  NA 132* 132* 132* 130* 132*  K 3.9 3.8 4.1 3.9 3.5  CL 96* 98* 95* 97* 97*  CO2 27 26 29 24 26   GLUCOSE 107* 116* 137* 121* 203*  BUN 15 10 8 10 13   CREATININE 0.77 0.81 0.87 0.86 0.93  CALCIUM 8.9 8.6* 9.3 8.8* 8.5*  AST 19 16  --   --   --   ALT 15* 14*  --   --   --   ALKPHOS 66 61  --   --   --   BILITOT 0.5 0.5  --   --   --    ------------------------------------------------------------------------------------------------------------------ estimated creatinine clearance is 48.9 mL/min (by C-G formula based on Cr of 0.93). ------------------------------------------------------------------------------------------------------------------ No results for input(s): HGBA1C in the last 72 hours. ------------------------------------------------------------------------------------------------------------------ No results for input(s): CHOL, HDL, LDLCALC, TRIG, CHOLHDL, LDLDIRECT in the last 72 hours. ------------------------------------------------------------------------------------------------------------------ No results for input(s): TSH, T4TOTAL, T3FREE, THYROIDAB in the last 72 hours.  Invalid input(s): FREET3 ------------------------------------------------------------------------------------------------------------------ No results for input(s): VITAMINB12, FOLATE, FERRITIN, TIBC,  IRON, RETICCTPCT in the last 72 hours.  Coagulation profile No results for input(s): INR, PROTIME in the last 168 hours.  No results for input(s): DDIMER in the last 72 hours.  Cardiac Enzymes No results for input(s): CKMB, TROPONINI, MYOGLOBIN in the last 168 hours.  Invalid input(s): CK ------------------------------------------------------------------------------------------------------------------ Invalid input(s): POCBNP    Harlis Champoux MD PhD. on 02/26/2015 at 10:31 AM  Between 7am to 7pm - Pager - [phone]  After 7pm go to [email or web address] - password [email or web address]  And look for the night coverage person covering for me after hours  [facility] Office  [phone]

## 2015-02-26 NOTE — Progress Notes (Addendum)
Speech Language Pathology Treatment: Dysphagia  Patient Details Name: Kurt Navarro MRN: [id number] DOB: 1927/06/19 Today's Date: 02/26/2015 Time: 1011-1030 SLP Time Calculation (min) (ACUTE ONLY): 19 min  Assessment / Plan / Recommendation Clinical Impression  Pt has a wet vocal quality at baseline, concerning for pooling of secretions in pharynx. SLP provided Mod cues throughout session for use of throat clearing/coughing to clear secretions, with productive cough occurring ~50% of the time. Ice chips administered resulted in wet vocal quality and immediate coughing. SLP also provided instruction and Mod-Max cues for use of effortful swallows to target pharyngeal strengthening. Pt not yet ready to repeat MBS. MD present and updated, as well as wife and son who arrived at the completion of therapy. Will continue to follow for readiness to repeat MBS as overall strength continues to improve.   HPI Other Pertinent Information: 81 y.o. male with a past medical history of type 2 diabetes, hypertension, hyperlipidemia, heart murmur, BPH, GERD, hernia repair, coronary artery disease, glaucoma who was brought in by his son to the emergency department due to progressive weakness associated with 4 falls in the last 9 days and confusion. CXR Patchy bibasilar airspace opacities, right greater than left, raise concern for pneumonia given the patient's symptoms. Small right pleural effusion noted. Head CT negative. Found to have acute encephalopathy due to UTI.   Pertinent Vitals Pain Assessment: No/denies pain  SLP Plan  Continue with current plan of care    Recommendations Diet recommendations: NPO Medication Administration: Via alternative means       Oral Care Recommendations: Oral care QID Follow up Recommendations: Skilled Nursing facility Plan: Continue with current plan of care    Maxcine Ham, M.A. CCC-SLP [phone]  Maxcine Ham 02/26/2015, 10:44 AM

## 2015-02-26 NOTE — Progress Notes (Signed)
Physical Therapy Treatment Patient Details Name: Kurt Navarro MRN: [id number] DOB: 1927-03-21 Today's Date: 02/26/2015    History of Present Illness Patient is a 81 y/o male with hx of type 2 diabetes, HTN, HLD, heart murmur, BPH, GERD, CAD, glaucoma who was brought in by his son to the ED due to progressive weakness associated with 4 falls in the last 9 days and confusion. CXR-Decreased lung volumes with increased bibasilar atelectasis and questionable small RIGHT pleural effusion.    PT Comments    Pt agreeable to do LE exercises in the chair. Pt reported he did not want to walk because of the NG tube. Pt later agreed to take a few steps with the RW. Pt did demonstrate poor balance and required mod assist to take a few steps with the RW. Pt had increased difficulty sequencing with RW and had a posterior lean when attempting to step back to the chair. Recommend d/c to SNF for continued skilled PT services for improved independence and mobility.  Follow Up Recommendations  SNF     Equipment Recommendations  None recommended by PT    Recommendations for Other Services       Precautions / Restrictions Precautions Precautions: Fall;Other (comment) (ng tube) Restrictions Weight Bearing Restrictions: No    Mobility  Bed Mobility                  Transfers Overall transfer level: Needs assistance Equipment used: Rolling walker (2 wheeled) Transfers: Sit to/from Stand Sit to Stand: Mod assist         General transfer comment: Cues for hand placement and assistance with increasing forward trunk translation for initial rise up. Therapist provided mod balance support secondary to posterior lean during initial standing. Once midline was chieved pt was able to maintaint static standing with min guard.  Ambulation/Gait Ambulation/Gait assistance: Mod assist Ambulation Distance (Feet): 4 Feet Assistive device: Rolling walker (2 wheeled) Gait Pattern/deviations: Step-to  pattern;Narrow base of support;Shuffle;Trunk flexed     General Gait Details: Pt agreable to only take a few steps. Pt had increased difficulty with steps backwards requiring therapist assist to prevent loss of balance.    Stairs            Wheelchair Mobility    Modified Rankin (Stroke Patients Only)       Balance Overall balance assessment: Needs assistance Sitting-balance support: Feet supported Sitting balance-Leahy Scale: Fair     Standing balance support: Bilateral upper extremity supported Standing balance-Leahy Scale: Poor                      Cognition Arousal/Alertness: Awake/alert Behavior During Therapy: WFL for tasks assessed/performed Overall Cognitive Status: Impaired/Different from baseline Area of Impairment: Safety/judgement;Problem solving;Memory     Memory: Decreased short-term memory Following Commands: Follows one step commands consistently;Follows one step commands with increased time Safety/Judgement: Decreased awareness of safety;Decreased awareness of deficits   Problem Solving: Slow processing;Requires verbal cues      Exercises Total Joint Exercises Ankle Circles/Pumps: AROM;Strengthening;Both;10 reps;Supine Heel Slides: AROM;Strengthening;Both;10 reps;Supine Hip ABduction/ADduction: AROM;Strengthening;Both;10 reps;Supine Straight Leg Raises: AROM;Strengthening;Both;10 reps;Supine    General Comments        Pertinent Vitals/Pain Pain Assessment: No/denies pain    Home Living                      Prior Function            PT Goals (current goals can now be found in  the care plan section)      Frequency  Min 2X/week    PT Plan Current plan remains appropriate    Co-evaluation             End of Session Equipment Utilized During Treatment: Gait belt Activity Tolerance: Other (comment) (pt did not want to walk due to NG tube) Patient left: in chair;with call bell/phone within reach;with  family/visitor present     Time: 8469-6295 PT Time Calculation (min) (ACUTE ONLY): 28 min  Charges:  $Gait Training: 8-22 mins $Therapeutic Exercise: 8-22 mins                    G Codes:      Greggory Stallion 02/26/2015, 1:08 PM

## 2015-02-27 ENCOUNTER — Inpatient Hospital Stay (HOSPITAL_COMMUNITY): Payer: Medicare Other

## 2015-02-27 DIAGNOSIS — R0600 Dyspnea, unspecified: Secondary | ICD-10-CM

## 2015-02-27 DIAGNOSIS — R404 Transient alteration of awareness: Secondary | ICD-10-CM

## 2015-02-27 LAB — BASIC METABOLIC PANEL
ANION GAP: 8 (ref 5–15)
BUN: 13 mg/dL (ref 6–20)
CALCIUM: 8.5 mg/dL — AB (ref 8.9–10.3)
CHLORIDE: 96 mmol/L — AB (ref 101–111)
CO2: 29 mmol/L (ref 22–32)
CREATININE: 0.86 mg/dL (ref 0.61–1.24)
GFR calc non Af Amer: >60 mL/min (ref >60)
Glucose, Bld: 234 mg/dL — ABNORMAL HIGH (ref 65–99)
Potassium: 3.4 mmol/L — ABNORMAL LOW (ref 3.5–5.1)
SODIUM: 133 mmol/L — AB (ref 135–145)

## 2015-02-27 LAB — GLUCOSE, CAPILLARY
GLUCOSE-CAPILLARY: 194 mg/dL — AB (ref 65–99)
GLUCOSE-CAPILLARY: 156 mg/dL — AB (ref 65–99)
Glucose-Capillary: 249 mg/dL — ABNORMAL HIGH (ref 65–99)
Glucose-Capillary: 172 mg/dL — ABNORMAL HIGH (ref 65–99)
Glucose-Capillary: 183 mg/dL — ABNORMAL HIGH (ref 65–99)
Glucose-Capillary: 203 mg/dL — ABNORMAL HIGH (ref 65–99)

## 2015-02-27 LAB — MAGNESIUM: MAGNESIUM: 2 mg/dL (ref 1.7–2.4)

## 2015-02-27 MED ORDER — INSULIN GLARGINE 100 UNIT/ML ~~LOC~~ SOLN
10.0000 [IU] | Freq: Every day | SUBCUTANEOUS | Status: DC
  Administered 2015-02-27 – 2015-03-02 (×4): 10 [IU] via SUBCUTANEOUS
  Filled 2015-02-27 (×5): qty 0.1

## 2015-02-27 NOTE — Progress Notes (Signed)
[facility]                                                                              Patient Demographics  Kurt Navarro, is a 81 y.o. male, DOB - 05/09/1927, NWG:[id number]  Admit date - 02/21/2015   Admitting Physician Bobette Mo, MD  Outpatient Primary MD for the patient is Thayer Headings, MD  LOS - 6   Chief Complaint  Patient presents with  . Fall      HPI 02/21/2015 by Dr. Sanda Klein Kurt Navarro is a 81 y.o. male with a past medical history of type 2 diabetes, hypertension, hyperlipidemia, heart murmur, BPH, GERD, coronary artery disease, glaucoma who was brought in by his son to the emergency department due to progressive weakness associated with 4 falls in the last 9 days and confusion. Apparently the patient has not hit his head, he hasn't have fever or chills, but he is usually alert oriented 3 and has been unable to tell the current date even down to the year, as well as being confused with other events for the past week plus.He is he is oriented to place and is currently in no acute distress. He is unable to provide further details on his history.   Assessment & Plan   Acute encephalopathy secondary UTI/Pneumonia -CT head: No acute intracranial abnormality -CXR 8/26: Decreased lung volumes with increased bibasilar atelectasis, questionable small right pleural effusion -UA: WBC TNTC, Large leukocytes  -Urine culture >100K Enterococcus -Patient currently afebrile with no leukocytosis -CXR 8/28: Has a bi-basilar airspace opacities, concerning for pneumonia -question whether pneumonia secondary to aspiration. -Continue vancomycin and Zosyn -Strep pneumonia urine antigen negative, legionella urine antigen negative -finished uti treatment for 5days with vanc. Continue zosyn for now to cover possible aspiration pna.  Dysphagia -Speech consulted and appreciated. Patient had barium swallow and failed. -pended tube inserted, nutrition consult for tube  feeds -MRI of the head pending, not able to lay flat due to secretions, will re attempt, consider ct head if not able to tolerate mri -discussed with patient and son over the phone, currently they are not sure about feeding tube placement, patient changes his minds at times, family want to coordinate with speech  Therapy to get swallow eval tomorrow, family also interested in  Talking to palliative care for goal of care discussion   Fall/physical deconditioning -family reported progressive weakness and multiple falls in the last month PT consulted and appreciated, recommended SNF -social work consulted for placement  Diabetes mellitus, type 2 -Metformin held -Continue insulin sliding scale CBG monitoring  Hyperlipidemia -Continue statin  Essential hypertension -Continue Cozaar  BPH -Continue Proscar, Flomax  Hypothyroidism -Continue Synthroid  Code Status: partial, no intubation  Family Communication:  son via phone (Cell [phone])  Disposition Plan:   Pending, depends on goal of care discussion  Time Spent in minutes   35 minutes  Procedures  Barium swallow  Consults   None  DVT Prophylaxis  Lovenox  Lab Results  Component Value Date   PLT 316 02/26/2015    Medications  Scheduled Meds: . enoxaparin (LOVENOX) injection  40 mg Subcutaneous Q24H  . ferrous sulfate  300 mg Per Tube BID  WC  . insulin aspart  0-5 Units Subcutaneous QHS  . insulin aspart  0-9 Units Subcutaneous TID WC  . insulin glargine  10 Units Subcutaneous QHS  . latanoprost  1 drop Both Eyes QHS  . levothyroxine  50 mcg Per Tube QAC breakfast  . losartan  100 mg Per Tube Daily  . multivitamin with minerals  1 tablet Per Tube Daily  . pantoprazole sodium  40 mg Per Tube Daily  . piperacillin-tazobactam (ZOSYN)  IV  3.375 g Intravenous Q8H  . pravastatin  20 mg Per Tube QPM   Continuous Infusions: . feeding supplement (JEVITY 1.2 CAL) 1,000 mL (02/27/15 1342)   PRN  Meds:.  Antibiotics    Anti-infectives    Start     Dose/Rate Route Frequency Ordered Stop   02/24/15 0600  piperacillin-tazobactam (ZOSYN) IVPB 3.375 g     3.375 g 12.5 mL/hr over 240 Minutes Intravenous Every 8 hours 02/24/15 0027     02/24/15 0030  piperacillin-tazobactam (ZOSYN) IVPB 3.375 g     3.375 g 100 mL/hr over 30 Minutes Intravenous  Once 02/24/15 0027 02/24/15 0130   02/23/15 1700  vancomycin (VANCOCIN) 1,250 mg in sodium chloride 0.9 % 250 mL IVPB  Status:  Discontinued     1,250 mg 166.7 mL/hr over 90 Minutes Intravenous Every 24 hours 02/23/15 1556 02/27/15 1103   02/22/15 1630  cefTRIAXone (ROCEPHIN) 1 g in dextrose 5 % 50 mL IVPB  Status:  Discontinued     1 g 100 mL/hr over 30 Minutes Intravenous Every 24 hours 02/21/15 2054 02/23/15 1545   02/21/15 1630  cefTRIAXone (ROCEPHIN) 1 g in dextrose 5 % 50 mL IVPB     1 g 100 mL/hr over 30 Minutes Intravenous  Once 02/21/15 1626 02/21/15 1822      Subjective:   Kurt Navarro seen and examined today.  Frail, but looked more alert today, Denies chest pain, shortness of breath, abdominal pain.   Objective:   Filed Vitals:   02/26/15 2238 02/27/15 0431 02/27/15 0438 02/27/15 1502  BP: 156/63  149/59 154/72  Pulse: 76  75 79  Temp: 98.7 F (37.1 C)  98.8 F (37.1 C) 98.2 F (36.8 C)  TempSrc: Oral  Oral Oral  Resp: 22  20 18   Height:      Weight:  132 lb 11.5 oz (60.2 kg)    SpO2: 96%  94%     Wt Readings from Last 3 Encounters:  02/27/15 132 lb 11.5 oz (60.2 kg)  12/12/14 146 lb (66.225 kg)  12/11/14 146 lb (66.225 kg)     Intake/Output Summary (Last 24 hours) at 02/27/15 1822 Last data filed at 02/27/15 1037  Gross per 24 hour  Intake    100 ml  Output    550 ml  Net   -450 ml    Exam  General: frail, elderly, chronically ill-appearing  HEENT: NCAT, mucous membranes moist.   Cardiovascular: S1 S2 auscultated, RRR  Respiratory: Diminished breath sounds, Course at bases.  Abdomen: Soft,  nontender, nondistended, + bowel sounds  Extremities: warm dry without cyanosis clubbing or edema  Neuro: AAOx3,  Nonfocal. Able to lift legs against gravity, upper extremity strength 4/5.  Skin: bruising and ecchymosis noted on the extremities.  Data Review   Micro Results Recent Results (from the past 240 hour(s))  Urine culture     Status: None   Collection Time: 02/21/15  5:10 PM  Result Value Ref Range Status   Specimen Description  URINE, CLEAN CATCH  Final   Special Requests Immunocompromised  Final   Culture >=100,000 COLONIES/mL ENTEROCOCCUS SPECIES  Final   Report Status 02/24/2015 FINAL  Final   Organism ID, Bacteria ENTEROCOCCUS SPECIES  Final      Susceptibility   Enterococcus species - MIC*    AMPICILLIN <=2 RESISTANT Resistant     LEVOFLOXACIN >=8 RESISTANT Resistant     NITROFURANTOIN <=16 SENSITIVE Sensitive     VANCOMYCIN 1 SENSITIVE Sensitive     * >=100,000 COLONIES/mL ENTEROCOCCUS SPECIES    Radiology Reports Dg Chest 2 View  02/21/2015   CLINICAL DATA:  Multiple falls yesterday, increased confusion, incontinence, decreased appetite, history hypertension, diabetes mellitus, coronary artery disease, former smoker  EXAM: CHEST  2 VIEW  COMPARISON:  02/13/2015  FINDINGS: Low lung volumes.  Upper normal heart size.  Atherosclerotic calcification aorta.  Pulmonary vascular congestion.  Bibasilar atelectasis, increased.  Apices clear.  Questionable small RIGHT pleural effusion.  No pneumothorax.  Bones demineralized.  IMPRESSION: Decreased lung volumes with increased bibasilar atelectasis and questionable small RIGHT pleural effusion.   Electronically Signed   By: Ulyses Southward M.D.   On: 02/21/2015 18:17   Dg Chest 2 View  02/05/2015   CLINICAL DATA:  Larey Seat. Week and soreness all over. Tenderness in the back and right shoulder area.  EXAM: CHEST  2 VIEW  COMPARISON:  04/28/2014  FINDINGS: There are old right rib fractures. Age-indeterminate fractures involving the left  fifth and sixth ribs. Densities at the left lung base could represent atelectasis or scarring. Heart size is grossly normal with atherosclerotic calcifications at the aorta. Negative for a pneumothorax. Again noted is a compression fracture in the upper lumbar spine.  IMPRESSION: Low lung volumes bilaterally.  Bilateral rib fractures. Left rib fractures are age indeterminate and new from the prior examination.   Electronically Signed   By: Richarda Overlie M.D.   On: 02/05/2015 07:55   Dg Lumbar Spine Complete  02/05/2015   CLINICAL DATA:  Injury, fall in the bathroom  EXAM: LUMBAR SPINE - COMPLETE 4+ VIEW  COMPARISON:  01/03/2015  FINDINGS: Five views of the lumbar spine submitted. There is diffuse osteopenia. Atherosclerotic calcifications of abdominal aorta and iliac arteries. Again noted compression deformity upper endplate of L1 vertebral body is stable from prior exam. There is disc space flattening with mild anterior spurring at T12-L1 and L1-L2 level. Mild anterior spurring upper endplate of L3 and L4 vertebral body. Minimal disc space flattening at L5-S1 level. There is mild lower lumbar levoscoliosis.  IMPRESSION: No acute fracture or subluxation. Mild lower lumbar levoscoliosis. Degenerative changes as described above. Stable compression fracture upper endplate of L1 vertebral body. Diffuse osteopenia.   Electronically Signed   By: Natasha Mead M.D.   On: 02/05/2015 08:05   Dg Shoulder Right  02/05/2015   CLINICAL DATA:  Larey Seat with weakness. Tenderness in the back and right shoulder area.  EXAM: RIGHT SHOULDER - 2+ VIEW  COMPARISON:  04/28/2014  FINDINGS: There are old right rib fractures. The right shoulder is located without a fracture. Right AC joint is intact. Evidence for spurring and degenerative changes at the right glenohumeral joint.  IMPRESSION: No acute bone abnormality in the right shoulder.   Electronically Signed   By: Richarda Overlie M.D.   On: 02/05/2015 08:00   Ct Head Wo Contrast  02/21/2015    CLINICAL DATA:  Multiple falls with 2 yesterday. Confusion. Incontinence. Decreased appetite.  EXAM: CT HEAD WITHOUT CONTRAST  TECHNIQUE: Contiguous axial images were obtained from the base of the skull through the vertex without intravenous contrast.  COMPARISON:  None  FINDINGS: Sinuses/Soft tissues: Surgical changes about the globes. Mucosal thickening of the left maxillary sinus. No skull fracture. Clear mastoid air cells.  Intracranial: Expected cerebral atrophy. Moderate low density in the periventricular white matter likely related to small vessel disease. No mass lesion, hemorrhage, hydrocephalus, acute infarct, intra-axial, or extra-axial fluid collection.  IMPRESSION: 1.  No acute intracranial abnormality. 2.  Cerebral atrophy and small vessel ischemic change. 3. Mild sinus disease.   Electronically Signed   By: Jeronimo Greaves M.D.   On: 02/21/2015 17:56   Ct Head Wo Contrast  02/05/2015   CLINICAL DATA:  Fall.  Hit head.  EXAM: CT HEAD WITHOUT CONTRAST  TECHNIQUE: Contiguous axial images were obtained from the base of the skull through the vertex without intravenous contrast.  COMPARISON:  None.  FINDINGS: There is diffuse low attenuation throughout the subcortical and periventricular white matter compatible with chronic microvascular disease. Prominence of the sulci and the ventricles noted consistent with brain atrophy. No abnormal extra-axial fluid collections, intracranial hemorrhage or mass noted. There is mild mucosal thickening involving the maxillary sinuses. The paranasal sinuses are clear. The mastoid air cells appear clear. The calvarium is intact.  IMPRESSION: 1. No acute intracranial abnormality. 2. Chronic microvascular disease and brain atrophy.   Electronically Signed   By: Signa Kell M.D.   On: 02/05/2015 08:26   Ct Chest W Contrast  02/05/2015   CLINICAL DATA:  Fall.  Right rib pain  EXAM: CT CHEST WITH CONTRAST  TECHNIQUE: Multidetector CT imaging of the chest was performed  during intravenous contrast administration.  CONTRAST:  OMNIPAQUE IOHEXOL 300 MG/ML  SOLN  COMPARISON:  Chest x-ray 02/05/2015.  No prior chest CT for review  FINDINGS: Left upper lobe mass lesion measures 3.5 x 11.6 cm. This has spiculated margins and is worrisome for carcinoma. Additional spiculated mass in the superior segment left lower lobe adjacent to the major fissure measures approximately 23 x 13 mm. These lesions are difficult to see on chest x-ray due to their location. Both lesions are worrisome for carcinoma. No mediastinal adenopathy.  Chronic lung disease with probable interstitial scarring on the right.  Right lower lobe airspace disease may represent atelectasis or pneumonia. Small right effusion. Mild left lower lobe atelectasis.  Coronary artery calcification. Heart size normal. Atherosclerotic calcification aortic arch without aneurysm or dissection. No central pulmonary embolus.  Chronic right rib fractures with deformity. No acute right rib fracture. Left rib fractures appear subacute with callus formation. No acute left rib fracture.  Negative for mass in the upper abdomen.  Mild compression fracture at T11 of indeterminate age.  IMPRESSION: Spiculated mass left upper lobe and superior segment left lower lobe. These findings are concerning for carcinoma of the lung. Pneumonia and fibrosis considered less likely. PET-CT recommended for further evaluation.  Probable chronic lung disease with interstitial scarring on the right. Right lower lobe atelectasis/ infiltrate. Small right effusion. Pneumonia not excluded on the right.  Bilateral rib fractures appear subacute to chronic. No acute rib fracture.  Mild compression fracture T11, possibly acute.  These results were called by telephone at the time of interpretation on 02/05/2015 at 11:18 am to [facility] , who verbally acknowledged these results.   Electronically Signed   By: Marlan Palau M.D.   On: 02/05/2015 11:19   Ct Cervical  Spine Wo Contrast  02/05/2015  CLINICAL DATA:  81 year old with recent fall. Head laceration. Complains of pain in the upper back.  EXAM: CT CERVICAL SPINE WITHOUT CONTRAST  TECHNIQUE: Multidetector CT imaging of the cervical spine was performed without intravenous contrast. Multiplanar CT image reconstructions were also generated.  COMPARISON:  01/25/2008  FINDINGS: Negative for an acute fracture dislocation in the cervical spine. There is mild sclerosis and deformity of the T3 superior endplate which appears new since 2009. There is mild retrolisthesis at C3-C4 with disc space narrowing. Bilateral facet arthropathy. No significant soft tissue edema. No significant neck lymphadenopathy.  Images of the upper lungs demonstrate a 2.7 x 1.1 cm opacity in the medial left upper lobe. This opacity contains some scattered calcifications. There are additional patchy opacities and calcifications in the right upper lung. There is also a small focal opacity in the medial superior segment of the left lower lobe measuring up to 1.5 cm.  IMPRESSION: Degenerative changes in cervical spine without acute bone abnormality.  Mild compression deformity of the T3 vertebral body of unknown age. Legrand Rams an old injury based on the sclerosis but recommend clinical correlation.  There are focal parenchymal densities or lesions in the left upper lung, particularly in the left upper lobe. These appear to be new since 2009. Findings are nonspecific but a neoplastic process cannot be excluded. Recommend further characterization with a post contrast chest CT.  These results were called by telephone at the time of interpretation on 02/05/2015 at 8:42 am to [facility], PA-C, who verbally acknowledged these results.   Electronically Signed   By: Richarda Overlie M.D.   On: 02/05/2015 08:52   Dg Chest Port 1 View  02/24/2015   CLINICAL DATA:  Increased tracheal secretions, acute onset. Initial encounter.  EXAM: PORTABLE CHEST - 1 VIEW  COMPARISON:   Chest radiograph performed 02/21/2015  FINDINGS: The lungs remain hypoexpanded. Patchy bibasilar airspace opacities, right greater than left, raise concern for pneumonia, given the patient's symptoms. A small right pleural effusion is noted. No pneumothorax is seen.  The cardiomediastinal silhouette remains normal in size. No acute osseous abnormalities are identified.  IMPRESSION: Lungs hypoexpanded. Patchy bibasilar airspace opacities, right greater than left, raise concern for pneumonia given the patient's symptoms. Small right pleural effusion noted.   Electronically Signed   By: Roanna Raider M.D.   On: 02/24/2015 00:01   Dg Abd Portable 1v  02/24/2015   CLINICAL DATA:  NG tube placement  EXAM: PORTABLE ABDOMEN - 1 VIEW  COMPARISON:  No recent similar comparison exam  FINDINGS: Feeding tube tip terminates over the expected location of the gastric cardia. Right greater than left basilar lung field opacification is partly visualized. Normal bowel gas pattern in visualized aspects with bowel retained oral contrast.  IMPRESSION: Feeding tube tip terminates over the expected location of the gastric cardia.   Electronically Signed   By: Christiana Pellant M.D.   On: 02/24/2015 19:44   Dg Swallowing Func-speech Pathology  02/24/2015    Objective Swallowing Evaluation:   Modified Barium Swallow Patient Details  Name: Kurt Navarro MRN: [id number] Date of Birth: May 07, 1927  Today's Date: 02/24/2015 Time: SLP Start Time (ACUTE ONLY): 1305-SLP Stop Time (ACUTE ONLY): 1328 SLP Time Calculation (min) (ACUTE ONLY): 23 min  Past Medical History:  Past Medical History  Diagnosis Date  . Diabetes mellitus without complication   . Hypertension   . Hypercholesteremia   . Heart murmur   . Frequent urination   . BPH (benign prostatic hyperplasia)   .  GERD (gastroesophageal reflux disease)   . Coronary artery disease     medical management with aspirin  . Glaucoma    Past Surgical History:  Past Surgical History  Procedure  Laterality Date  . Leg surgery  5 years ago    Rod in right leg  . Back surgery  years ago    lower  . Hernia repair  years ago  . Cystoscopy with retrograde pyelogram, ureteroscopy and stent placement  Bilateral 12/12/2014    Procedure: CYSTO WITH BILATERAL RETROGRADE/BIOPSY WITH  FULGURATION/HYDRODISTENSION OF BLADDER;  Surgeon: Bjorn Pippin, MD;   Location: [facility];  Service: Urology;  Laterality: Bilateral;   HPI:  Other Pertinent Information: 81 y.o. male with a past medical history of  type 2 diabetes, hypertension, hyperlipidemia, heart murmur, BPH, GERD,  hernia repair, coronary artery disease, glaucoma who was brought in by his  son to the emergency department due to progressive weakness associated  with 4 falls in the last 9 days and confusion. CXR Patchy bibasilar  airspace opacities, right greater than left, raise concern for pneumonia  given the patient's symptoms. Small right pleural effusion noted. Head CT  negative. Found to have acute encephalopathy due to UTI.  No Data Recorded  Assessment / Plan / Recommendation CHL IP CLINICAL IMPRESSIONS 02/24/2015  Therapy Diagnosis Mild oral phase dysphagia;Moderate pharyngeal phase  dysphagia;Severe pharyngeal phase dysphagia  Clinical Impression Delayed oral transit and cohesion across  consistencies. Moderate-severe sensorimotor based pharyngeal dysphagia and  decreased sensation with delayed swallow initiation. Laryngeal penetration  and aspiration (silent) across all consistencies assessed during initial  swallow and in attempts to clear mod-max vallecular and moderate pyriform  sinus residue. Inadequate sensation to residue and unable to safety reduce  with max cues for multiple swallows. Pt at significant aspiration risk at  present wth clinical findings and current confusion, recommend NPO,  temporary means of nutrition and oral care. ST will follow.        CHL IP TREATMENT RECOMMENDATION 02/24/2015  Treatment Recommendations Therapy as outlined in treatment  plan below     CHL IP DIET RECOMMENDATION 02/24/2015  SLP Diet Recommendations NPO;Alternative means - temporary  Liquid Administration via (None)  Medication Administration Via alternative means  Compensations (None)  Postural Changes and/or Swallow Maneuvers (None)     CHL IP OTHER RECOMMENDATIONS 02/24/2015  Recommended Consults (None)  Oral Care Recommendations Oral care QID  Other Recommendations (None)     No flowsheet data found.   CHL IP FREQUENCY AND DURATION 02/24/2015  Speech Therapy Frequency (ACUTE ONLY) min 2x/week  Treatment Duration 2 weeks     Pertinent Vitals/Pain none    SLP Swallow Goals No flowsheet data found.  No flowsheet data found.    CHL IP REASON FOR REFERRAL 02/24/2015  Reason for Referral Objectively evaluate swallowing function               No flowsheet data found.         Royce Macadamia 02/24/2015, 2:13 PM   Breck Coons Lonell Face.Ed CCC-SLP Pager [phone]      Dg Hip Unilat  With Pelvis 2-3 Views Right  02/21/2015   CLINICAL DATA:  Multiple falls over the past 10 days. Right hip pain and bruising. History of prior right intertrochanteric fracture. Initial encounter.  EXAM: DG HIP (WITH OR WITHOUT PELVIS) 2-3V RIGHT  COMPARISON:  Single view of the abdomen 12/26/2014.  FINDINGS: A hip screw and long IM nail are in place for fixation of  a healed right intertrochanteric fracture. No acute fracture is identified. No notable degenerative change is identified. No focal bony lesion is seen.  IMPRESSION: No acute abnormality. Healed right intertrochanteric fracture with fixation hardware in place.   Electronically Signed   By: Drusilla Kanner M.D.   On: 02/21/2015 15:45    CBC  Recent Labs Lab 02/21/15 1719 02/22/15 0531 02/23/15 0600 02/26/15 0632  WBC 7.7 6.7 8.4 9.7  HGB 11.7* 10.8* 11.6* 11.8*  HCT 34.3* 31.6* 33.9* 35.1*  PLT 314 305 334 316  MCV 89.8 89.5 90.4 90.0  MCH 30.6 30.6 30.9 30.3  MCHC 34.1 34.2 34.2 33.6  RDW 14.8 14.9 14.5 14.6  LYMPHSABS 0.9  --    --   --   MONOABS 0.7  --   --   --   EOSABS 0.1  --   --   --   BASOSABS 0.0  --   --   --     Chemistries   Recent Labs Lab 02/21/15 1719 02/22/15 0531 02/23/15 0600 02/25/15 0720 02/26/15 0632 02/27/15 0653  NA 132* 132* 132* 130* 132* 133*  K 3.9 3.8 4.1 3.9 3.5 3.4*  CL 96* 98* 95* 97* 97* 96*  CO2 27 26 29 24 26 29   GLUCOSE 107* 116* 137* 121* 203* 234*  BUN 15 10 8 10 13 13   CREATININE 0.77 0.81 0.87 0.86 0.93 0.86  CALCIUM 8.9 8.6* 9.3 8.8* 8.5* 8.5*  MG  --   --   --   --   --  2.0  AST 19 16  --   --   --   --   ALT 15* 14*  --   --   --   --   ALKPHOS 66 61  --   --   --   --   BILITOT 0.5 0.5  --   --   --   --    ------------------------------------------------------------------------------------------------------------------ estimated creatinine clearance is 52.5 mL/min (by C-G formula based on Cr of 0.86). ------------------------------------------------------------------------------------------------------------------ No results for input(s): HGBA1C in the last 72 hours. ------------------------------------------------------------------------------------------------------------------ No results for input(s): CHOL, HDL, LDLCALC, TRIG, CHOLHDL, LDLDIRECT in the last 72 hours. ------------------------------------------------------------------------------------------------------------------  Recent Labs  02/26/15 1216  TSH 2.867   ------------------------------------------------------------------------------------------------------------------ No results for input(s): VITAMINB12, FOLATE, FERRITIN, TIBC, IRON, RETICCTPCT in the last 72 hours.  Coagulation profile No results for input(s): INR, PROTIME in the last 168 hours.  No results for input(s): DDIMER in the last 72 hours.  Cardiac Enzymes No results for input(s): CKMB, TROPONINI, MYOGLOBIN in the last 168 hours.  Invalid input(s):  CK ------------------------------------------------------------------------------------------------------------------ Invalid input(s): POCBNP    Shoshannah Faubert MD PhD. on 02/27/2015 at 6:22 PM  Between 7am to 7pm - Pager - [phone]  After 7pm go to [email or web address] - password [email or web address]  And look for the night coverage person covering for me after hours  [facility] Office  [phone]

## 2015-02-27 NOTE — Progress Notes (Signed)
*  PRELIMINARY RESULTS* Echocardiogram 2D Echocardiogram has been performed.  Jeryl Columbia 02/27/2015, 1:56 PM

## 2015-02-27 NOTE — Progress Notes (Signed)
Nutrition Follow-up  DOCUMENTATION CODES:   Not applicable  INTERVENTION:   Continue Jevity 1.2 @ 70 ml/hr via NGT  Tube feeding regimen provides 2016 kcal (100% of needs), 93 grams of protein, and 1356 ml of H2O.   NUTRITION DIAGNOSIS:   Inadequate oral intake related to dysphagia as evidenced by NPO status.  Ongoing  GOAL:   Patient will meet greater than or equal to 90% of their needs  Met with TF  MONITOR:   Labs, Weight trends, Skin, I & O's  REASON FOR ASSESSMENT:   Consult Enteral/tube feeding initiation and management  ASSESSMENT:   Kurt Navarro is a 81 y.o. male with a past medical history of type 2 diabetes, hypertension, hyperlipidemia, heart murmur, BPH, GERD, coronary artery disease, glaucoma who was brought in by his son to the emergency department due to progressive weakness associated with 4 falls in the last 9 days and confusion. Apparently the patient has not hit his head, he hasn't have fever or chills, but he is usually alert oriented 3 and has been unable to tell the current date even down to the year, as well as being confused with other events for the past week plus.He is he is oriented to place and is currently in no acute distress. He is unable to provide further details on his history.  Pt sleeping soundly at time of visit. Noted Jevity 1.2 is infusing via NGT at goal rate of 70 ml/hr.   Spoke with RN, who reports pt is tolerating TF well. She reports she anticipates a goals of care discussion soon, as she is unsure if pt will require short term vs long term TF. She confirms that pt family does not want PEG placement.   Spoke with SLP, who reports pt with continued wet vocal quality; not yet ready for repeat MBSS.   Labs reviewed: Na: 133, K: 3.4. Mg WDL.   CSW following. Pt has a bed offer at [facility]. Barrier to discharge is need to transition to PO diet vs PEG.   Diet Order:  Diet NPO time specified  Skin:  Wound (see comment)  (stage II sacrum, mid back skin tear)  Last BM:  02/27/15  Height:   Ht Readings from Last 1 Encounters:  02/21/15 $RemoveB'5\' 5"'pPiITlGg$  (1.651 m)    Weight:   Wt Readings from Last 1 Encounters:  02/27/15 132 lb 11.5 oz (60.2 kg)    Ideal Body Weight:  61.8 kg  BMI:  Body mass index is 22.09 kg/(m^2).  Estimated Nutritional Needs:   Kcal:  1900-2100  Protein:  85-100 grams  Fluid:  >1.9 L  EDUCATION NEEDS:   No education needs identified at this time  Layli Capshaw A. Jimmye Norman, RD, LDN, CDE Pager: [phone] After hours Pager: [phone]

## 2015-02-27 NOTE — Progress Notes (Signed)
IR aware of request for percutaneous gastrostomy tube for dysphagia. I have spoke with the patient however family wished for me to speak with them outside the room and not in front of the patient today regarding the procedure. Discussion was held with the patient's son and spouse today. They are uncertain at this time if they would like to proceed with a gastrostomy tube and would like to further discuss goals of care and have a repeat swallow evaluation prior to any type of G-tube procedure. I have called and discussed this with Dr. Roda Shutters and IR will chart follow. If Gastrostomy tube wanted and needed we can look to perform this next Tuesday if patient is stable and labs/vitals wnl. CT anatomy reviewed by Dr. Grace Isaac and is would be amendable to percutaneous approach.   Pattricia Boss PA-C Interventional Radiology  02/27/15  12:50 PM

## 2015-02-28 ENCOUNTER — Inpatient Hospital Stay (HOSPITAL_COMMUNITY): Payer: Medicare Other

## 2015-02-28 DIAGNOSIS — Z515 Encounter for palliative care: Secondary | ICD-10-CM | POA: Insufficient documentation

## 2015-02-28 DIAGNOSIS — R4182 Altered mental status, unspecified: Secondary | ICD-10-CM

## 2015-02-28 LAB — COMPREHENSIVE METABOLIC PANEL
ALBUMIN: 2.7 g/dL — AB (ref 3.5–5.0)
ALK PHOS: 83 U/L (ref 38–126)
ALT: 25 U/L (ref 17–63)
ANION GAP: 7 (ref 5–15)
AST: 15 U/L (ref 15–41)
BILIRUBIN TOTAL: 0.4 mg/dL (ref 0.3–1.2)
BUN: 14 mg/dL (ref 6–20)
CALCIUM: 8.9 mg/dL (ref 8.9–10.3)
CO2: 31 mmol/L (ref 22–32)
CREATININE: 0.84 mg/dL (ref 0.61–1.24)
Chloride: 97 mmol/L — ABNORMAL LOW (ref 101–111)
GFR calc Af Amer: >60 mL/min (ref >60)
GFR calc non Af Amer: >60 mL/min (ref >60)
GLUCOSE: 229 mg/dL — AB (ref 65–99)
Potassium: 3.6 mmol/L (ref 3.5–5.1)
SODIUM: 135 mmol/L (ref 135–145)
TOTAL PROTEIN: 5.9 g/dL — AB (ref 6.5–8.1)

## 2015-02-28 LAB — GLUCOSE, CAPILLARY
Glucose-Capillary: 233 mg/dL — ABNORMAL HIGH (ref 65–99)
Glucose-Capillary: 221 mg/dL — ABNORMAL HIGH (ref 65–99)
Glucose-Capillary: 207 mg/dL — ABNORMAL HIGH (ref 65–99)
Glucose-Capillary: 154 mg/dL — ABNORMAL HIGH (ref 65–99)
Glucose-Capillary: 184 mg/dL — ABNORMAL HIGH (ref 65–99)

## 2015-02-28 LAB — CBC
HCT: 36.6 % — ABNORMAL LOW (ref 39.0–52.0)
Hemoglobin: 11.9 g/dL — ABNORMAL LOW (ref 13.0–17.0)
MCH: 29.8 pg (ref 26.0–34.0)
MCHC: 32.5 g/dL (ref 30.0–36.0)
MCV: 91.5 fL (ref 78.0–100.0)
Platelets: 317 10*3/uL (ref 150–400)
RBC: 4 MIL/uL — ABNORMAL LOW (ref 4.22–5.81)
RDW: 14.4 % (ref 11.5–15.5)
WBC: 8.1 10*3/uL (ref 4.0–10.5)

## 2015-02-28 LAB — PROTIME-INR
INR: 1.06 (ref 0.00–1.49)
Prothrombin Time: 14 seconds (ref 11.6–15.2)

## 2015-02-28 MED ORDER — RESOURCE THICKENUP CLEAR PO POWD
ORAL | Status: DC | PRN
  Filled 2015-02-28 (×2): qty 125

## 2015-02-28 NOTE — Progress Notes (Signed)
Physical Therapy Treatment Patient Details Name: Kurt Navarro MRN: [id number] DOB: 17-Nov-1926 Today's Date: 02/28/2015    History of Present Illness Patient is a 81 y/o male with hx of type 2 diabetes, HTN, HLD, heart murmur, BPH, GERD, CAD, glaucoma who was brought in by his son to the ED due to progressive weakness associated with 4 falls in the last 9 days and confusion. CXR-Decreased lung volumes with increased bibasilar atelectasis and questionable small RIGHT pleural effusion.    PT Comments    Pt is significantly deconditioned and unsteady on his feet.  He is progressing his gait distance, but continues to be a very high fall risk.  He will need SNF level rehab before retuning home with elderly wife.  PT will continue to follow acutely.   Follow Up Recommendations  SNF     Equipment Recommendations  None recommended by PT    Recommendations for Other Services   NA     Precautions / Restrictions Precautions Precautions: Fall    Mobility  Bed Mobility               General bed mobility comments: Pt seated in recliner chair  Transfers Overall transfer level: Needs assistance Equipment used: Rolling walker (2 wheeled) Transfers: Sit to/from Stand Sit to Stand: Mod assist         General transfer comment: Mod assist to support trunk to stand from recliner and from bed.   Ambulation/Gait Ambulation/Gait assistance: Mod assist Ambulation Distance (Feet): 20 Feet Assistive device: Rolling walker (2 wheeled) Gait Pattern/deviations: Step-through pattern;Scissoring;Narrow base of support;Trunk flexed;Staggering left;Staggering right Gait velocity: decreased   General Gait Details: Pt with scissoring gait pattern, stepping on his feet.  Left foot with decreased strength and progression compared to right foot, needs max verbal cues to pick up feet and take steps as well as to stay inside/closer to RW.  Manual cues for this as well.            Balance Overall  balance assessment: Needs assistance Sitting-balance support: Feet supported;Bilateral upper extremity supported Sitting balance-Leahy Scale: Fair     Standing balance support: Bilateral upper extremity supported Standing balance-Leahy Scale: Poor Standing balance comment: Pt stood with RW for several minutes while PT cleaned up incontinent bowel episode in chair.  Wife provided min guard assist in standing while PT cleaned him up.  In static standing before fatigue he does pretty well, it is the dynamic movements and the fact that he fatigues very quickly that does not help his balance.                     Cognition Arousal/Alertness: Awake/alert Behavior During Therapy: WFL for tasks assessed/performed Overall Cognitive Status: History of cognitive impairments - at baseline                             Pertinent Vitals/Pain Pain Assessment: No/denies pain           PT Goals (current goals can now be found in the care plan section) Acute Rehab PT Goals Patient Stated Goal: to go home, get stronger. Progress towards PT goals: Progressing toward goals    Frequency  Min 2X/week    PT Plan Current plan remains appropriate       End of Session Equipment Utilized During Treatment: Gait belt Activity Tolerance: Patient limited by fatigue Patient left: in chair;with call bell/phone within reach;with family/visitor present  Time: 4098-1191 PT Time Calculation (min) (ACUTE ONLY): 24 min  Charges:  $Gait Training: 8-22 mins $Therapeutic Activity: 8-22 mins                      Joclyn Alsobrook B. Rabab Currington, PT, DPT [phone]   02/28/2015, 4:03 PM

## 2015-02-28 NOTE — Progress Notes (Signed)
CSW sent updated clinicals to [facility].  CSW will continue to follow for SNF placement when medically stable  Merlyn Lot, LCSWA Clinical Social Worker [phone]

## 2015-02-28 NOTE — Care Management Important Message (Signed)
Important Message  Patient Details  Name: Kurt Navarro MRN: [id number] Date of Birth: May 16, 1927   Medicare Important Message Given:  Yes-third notification given    Kyla Balzarine 02/28/2015, 10:50 AMImportant Message  Patient Details  Name: Kurt Navarro MRN: [id number] Date of Birth: 1927-01-17   Medicare Important Message Given:  Yes-third notification given    Kyla Balzarine 02/28/2015, 10:50 AM

## 2015-02-28 NOTE — Progress Notes (Signed)
  Speech Pathology   MBSS complete. Full report located under chart review in imaging section.Click on DG swallow function. Recommend: Dys 1 (puree), pudding thick liquids.    Breck Coons [place].Ed [employer] [phone]

## 2015-02-28 NOTE — Progress Notes (Signed)
[facility]                                                                              Patient Demographics  Kurt Navarro, is a 81 y.o. male, DOB - 02-Feb-1927, ZOX:[id number]  Admit date - 02/21/2015   Admitting Physician Bobette Mo, MD  Outpatient Primary MD for the patient is Thayer Headings, MD  LOS - 7   Chief Complaint  Patient presents with  . Fall      HPI 02/21/2015 by Dr. Sanda Klein Kurt Navarro is a 81 y.o. male with a past medical history of type 2 diabetes, hypertension, hyperlipidemia, heart murmur, BPH, GERD, coronary artery disease, glaucoma who was brought in by his son to the emergency department due to progressive weakness associated with 4 falls in the last 9 days and confusion. Apparently the patient has not hit his head, he hasn't have fever or chills, but he is usually alert oriented 3 and has been unable to tell the current date even down to the year, as well as being confused with other events for the past week plus.He is he is oriented to place and is currently in no acute distress. He is unable to provide further details on his history.   Assessment & Plan   Acute encephalopathy secondary UTI/Pneumonia -CT head: No acute intracranial abnormality -CXR 8/26: Decreased lung volumes with increased bibasilar atelectasis, questionable small right pleural effusion -UA: WBC TNTC, Large leukocytes  -Urine culture >100K Enterococcus -Patient currently afebrile with no leukocytosis -CXR 8/28: Has a bi-basilar airspace opacities, concerning for pneumonia -pneumonia likely secondary to aspiration. -was treated with vancomycin and Zosyn -Strep pneumonia urine antigen negative, legionella urine antigen negative -finished uti treatment for 5days with vanc. Continue zosyn for now to cover possible aspiration pna. Finished abx for 7 days. D/c all abx, patient and family aware of recurrent risk of aspiration pneumonia, wanting to eat, they accept the risk ,  has not decided whether to treat with abx if recurrent aspiration pneumonia.  Dysphagia -has been npo since admission, has been on tube feeds through [facility]anda tube, nutrition and speech input appreciated. -discussed with patient and son over the phone, currently they are not sure about feeding tube placement, patient changes his minds at times, family want to coordinate with speech  Therapy to get repeat swallow on 9/2, family also interested in Talking to palliative care for goal of care discussion, IR consulted for possible peg tube placement, family later decided not to proceed with tube placement. -9/2 repeat swallow eval, showed risk of aspiration , family and patient are willing to take the risk of aspiration, now on puree diet and putting thick liquid, ng removed, appreciate palliative team intput, patient now DNR/DNI, no feeding tube, they agree with SNF placement and continue outpatient palliative care consult, patient and family has not decided whether to treat with abx if recurrent aspiration pneumonia.    Fall/physical deconditioning -family reported progressive weakness and multiple falls in the last month PT consulted and appreciated, recommended SNF -social work consulted for placement -SNF with outpatient palliative care  Diabetes mellitus, type 2 -Metformin held -Continue insulin sliding scale CBG monitoring -a1c pending  Hyperlipidemia -Continue  statin  Essential hypertension -Continue Cozaar  BPH -Continue Proscar, Flomax  Hypothyroidism -Continue Synthroid  Code Status: DNR/DNI  Family Communication:  Patient and wife in room  Disposition Plan:   SNF when bed available, medically ready to be discharged this weekend.  Time Spent in minutes   35 minutes  Procedures  Modified Barium swallow  Consults   Palliative care  DVT Prophylaxis  Lovenox  Lab Results  Component Value Date   PLT 317 02/28/2015    Medications  Scheduled Meds: . enoxaparin  (LOVENOX) injection  40 mg Subcutaneous Q24H  . ferrous sulfate  300 mg Per Tube BID WC  . insulin aspart  0-5 Units Subcutaneous QHS  . insulin aspart  0-9 Units Subcutaneous TID WC  . insulin glargine  10 Units Subcutaneous QHS  . latanoprost  1 drop Both Eyes QHS  . levothyroxine  50 mcg Per Tube QAC breakfast  . losartan  100 mg Per Tube Daily  . multivitamin with minerals  1 tablet Per Tube Daily  . pantoprazole sodium  40 mg Per Tube Daily  . pravastatin  20 mg Per Tube QPM   Continuous Infusions: . feeding supplement (JEVITY 1.2 CAL) Stopped (02/28/15 0940)   PRN Meds:.  Antibiotics    Anti-infectives    Start     Dose/Rate Route Frequency Ordered Stop   02/24/15 0600  piperacillin-tazobactam (ZOSYN) IVPB 3.375 g  Status:  Discontinued     3.375 g 12.5 mL/hr over 240 Minutes Intravenous Every 8 hours 02/24/15 0027 02/28/15 1223   02/24/15 0030  piperacillin-tazobactam (ZOSYN) IVPB 3.375 g     3.375 g 100 mL/hr over 30 Minutes Intravenous  Once 02/24/15 0027 02/24/15 0130   02/23/15 1700  vancomycin (VANCOCIN) 1,250 mg in sodium chloride 0.9 % 250 mL IVPB  Status:  Discontinued     1,250 mg 166.7 mL/hr over 90 Minutes Intravenous Every 24 hours 02/23/15 1556 02/27/15 1103   02/22/15 1630  cefTRIAXone (ROCEPHIN) 1 g in dextrose 5 % 50 mL IVPB  Status:  Discontinued     1 g 100 mL/hr over 30 Minutes Intravenous Every 24 hours 02/21/15 2054 02/23/15 1545   02/21/15 1630  cefTRIAXone (ROCEPHIN) 1 g in dextrose 5 % 50 mL IVPB     1 g 100 mL/hr over 30 Minutes Intravenous  Once 02/21/15 1626 02/21/15 1822      Subjective:   Oluwaferanmi Wain seen and examined today.  Feeling better, NG removed, happy that he can eat, sitting in chair , wife in room  Objective:   Filed Vitals:   02/27/15 0438 02/27/15 1502 02/27/15 2105 02/28/15 1345  BP: 149/59 154/72 164/70 143/70  Pulse: 75 79 72 71  Temp: 98.8 F (37.1 C) 98.2 F (36.8 C) 99.3 F (37.4 C) 98.8 F (37.1 C)  TempSrc:  Oral Oral Oral Oral  Resp: 20 18 18 20   Height:      Weight:      SpO2: 94%  96% 94%    Wt Readings from Last 3 Encounters:  02/27/15 132 lb 11.5 oz (60.2 kg)  12/12/14 146 lb (66.225 kg)  12/11/14 146 lb (66.225 kg)     Intake/Output Summary (Last 24 hours) at 02/28/15 1657 Last data filed at 02/28/15 1506  Gross per 24 hour  Intake    190 ml  Output   1500 ml  Net  -1310 ml    Exam  General: frail, elderly, chronically ill-appearing, NAD  HEENT: NCAT, mucous membranes  moist.   Cardiovascular: S1 S2 auscultated, RRR  Respiratory: Diminished breath sounds, Course at bases.  Abdomen: Soft, nontender, nondistended, + bowel sounds  Extremities: warm dry without cyanosis clubbing or edema  Neuro: AAOx3,  Nonfocal. Able to lift legs against gravity, upper extremity strength 4/5.  Skin: bruising and ecchymosis noted on the extremities.  Data Review   Micro Results Recent Results (from the past 240 hour(s))  Urine culture     Status: None   Collection Time: 02/21/15  5:10 PM  Result Value Ref Range Status   Specimen Description URINE, CLEAN CATCH  Final   Special Requests Immunocompromised  Final   Culture >=100,000 COLONIES/mL ENTEROCOCCUS SPECIES  Final   Report Status 02/24/2015 FINAL  Final   Organism ID, Bacteria ENTEROCOCCUS SPECIES  Final      Susceptibility   Enterococcus species - MIC*    AMPICILLIN <=2 RESISTANT Resistant     LEVOFLOXACIN >=8 RESISTANT Resistant     NITROFURANTOIN <=16 SENSITIVE Sensitive     VANCOMYCIN 1 SENSITIVE Sensitive     * >=100,000 COLONIES/mL ENTEROCOCCUS SPECIES    Radiology Reports Dg Chest 2 View  02/21/2015   CLINICAL DATA:  Multiple falls yesterday, increased confusion, incontinence, decreased appetite, history hypertension, diabetes mellitus, coronary artery disease, former smoker  EXAM: CHEST  2 VIEW  COMPARISON:  02/13/2015  FINDINGS: Low lung volumes.  Upper normal heart size.  Atherosclerotic calcification aorta.   Pulmonary vascular congestion.  Bibasilar atelectasis, increased.  Apices clear.  Questionable small RIGHT pleural effusion.  No pneumothorax.  Bones demineralized.  IMPRESSION: Decreased lung volumes with increased bibasilar atelectasis and questionable small RIGHT pleural effusion.   Electronically Signed   By: Ulyses Southward M.D.   On: 02/21/2015 18:17   Dg Chest 2 View  02/05/2015   CLINICAL DATA:  Larey Seat. Week and soreness all over. Tenderness in the back and right shoulder area.  EXAM: CHEST  2 VIEW  COMPARISON:  04/28/2014  FINDINGS: There are old right rib fractures. Age-indeterminate fractures involving the left fifth and sixth ribs. Densities at the left lung base could represent atelectasis or scarring. Heart size is grossly normal with atherosclerotic calcifications at the aorta. Negative for a pneumothorax. Again noted is a compression fracture in the upper lumbar spine.  IMPRESSION: Low lung volumes bilaterally.  Bilateral rib fractures. Left rib fractures are age indeterminate and new from the prior examination.   Electronically Signed   By: Richarda Overlie M.D.   On: 02/05/2015 07:55   Dg Lumbar Spine Complete  02/05/2015   CLINICAL DATA:  Injury, fall in the bathroom  EXAM: LUMBAR SPINE - COMPLETE 4+ VIEW  COMPARISON:  01/03/2015  FINDINGS: Five views of the lumbar spine submitted. There is diffuse osteopenia. Atherosclerotic calcifications of abdominal aorta and iliac arteries. Again noted compression deformity upper endplate of L1 vertebral body is stable from prior exam. There is disc space flattening with mild anterior spurring at T12-L1 and L1-L2 level. Mild anterior spurring upper endplate of L3 and L4 vertebral body. Minimal disc space flattening at L5-S1 level. There is mild lower lumbar levoscoliosis.  IMPRESSION: No acute fracture or subluxation. Mild lower lumbar levoscoliosis. Degenerative changes as described above. Stable compression fracture upper endplate of L1 vertebral body. Diffuse  osteopenia.   Electronically Signed   By: Natasha Mead M.D.   On: 02/05/2015 08:05   Dg Shoulder Right  02/05/2015   CLINICAL DATA:  Larey Seat with weakness. Tenderness in the back and right shoulder area.  EXAM: RIGHT SHOULDER - 2+ VIEW  COMPARISON:  04/28/2014  FINDINGS: There are old right rib fractures. The right shoulder is located without a fracture. Right AC joint is intact. Evidence for spurring and degenerative changes at the right glenohumeral joint.  IMPRESSION: No acute bone abnormality in the right shoulder.   Electronically Signed   By: Richarda Overlie M.D.   On: 02/05/2015 08:00   Ct Head Wo Contrast  02/21/2015   CLINICAL DATA:  Multiple falls with 2 yesterday. Confusion. Incontinence. Decreased appetite.  EXAM: CT HEAD WITHOUT CONTRAST  TECHNIQUE: Contiguous axial images were obtained from the base of the skull through the vertex without intravenous contrast.  COMPARISON:  None  FINDINGS: Sinuses/Soft tissues: Surgical changes about the globes. Mucosal thickening of the left maxillary sinus. No skull fracture. Clear mastoid air cells.  Intracranial: Expected cerebral atrophy. Moderate low density in the periventricular white matter likely related to small vessel disease. No mass lesion, hemorrhage, hydrocephalus, acute infarct, intra-axial, or extra-axial fluid collection.  IMPRESSION: 1.  No acute intracranial abnormality. 2.  Cerebral atrophy and small vessel ischemic change. 3. Mild sinus disease.   Electronically Signed   By: Jeronimo Greaves M.D.   On: 02/21/2015 17:56   Ct Head Wo Contrast  02/05/2015   CLINICAL DATA:  Fall.  Hit head.  EXAM: CT HEAD WITHOUT CONTRAST  TECHNIQUE: Contiguous axial images were obtained from the base of the skull through the vertex without intravenous contrast.  COMPARISON:  None.  FINDINGS: There is diffuse low attenuation throughout the subcortical and periventricular white matter compatible with chronic microvascular disease. Prominence of the sulci and the ventricles  noted consistent with brain atrophy. No abnormal extra-axial fluid collections, intracranial hemorrhage or mass noted. There is mild mucosal thickening involving the maxillary sinuses. The paranasal sinuses are clear. The mastoid air cells appear clear. The calvarium is intact.  IMPRESSION: 1. No acute intracranial abnormality. 2. Chronic microvascular disease and brain atrophy.   Electronically Signed   By: Signa Kell M.D.   On: 02/05/2015 08:26   Ct Chest W Contrast  02/05/2015   CLINICAL DATA:  Fall.  Right rib pain  EXAM: CT CHEST WITH CONTRAST  TECHNIQUE: Multidetector CT imaging of the chest was performed during intravenous contrast administration.  CONTRAST:  OMNIPAQUE IOHEXOL 300 MG/ML  SOLN  COMPARISON:  Chest x-ray 02/05/2015.  No prior chest CT for review  FINDINGS: Left upper lobe mass lesion measures 3.5 x 11.6 cm. This has spiculated margins and is worrisome for carcinoma. Additional spiculated mass in the superior segment left lower lobe adjacent to the major fissure measures approximately 23 x 13 mm. These lesions are difficult to see on chest x-ray due to their location. Both lesions are worrisome for carcinoma. No mediastinal adenopathy.  Chronic lung disease with probable interstitial scarring on the right.  Right lower lobe airspace disease may represent atelectasis or pneumonia. Small right effusion. Mild left lower lobe atelectasis.  Coronary artery calcification. Heart size normal. Atherosclerotic calcification aortic arch without aneurysm or dissection. No central pulmonary embolus.  Chronic right rib fractures with deformity. No acute right rib fracture. Left rib fractures appear subacute with callus formation. No acute left rib fracture.  Negative for mass in the upper abdomen.  Mild compression fracture at T11 of indeterminate age.  IMPRESSION: Spiculated mass left upper lobe and superior segment left lower lobe. These findings are concerning for carcinoma of the lung.  Pneumonia and fibrosis considered less likely. PET-CT recommended for  further evaluation.  Probable chronic lung disease with interstitial scarring on the right. Right lower lobe atelectasis/ infiltrate. Small right effusion. Pneumonia not excluded on the right.  Bilateral rib fractures appear subacute to chronic. No acute rib fracture.  Mild compression fracture T11, possibly acute.  These results were called by telephone at the time of interpretation on 02/05/2015 at 11:18 am to [facility] , who verbally acknowledged these results.   Electronically Signed   By: Marlan Palau M.D.   On: 02/05/2015 11:19   Ct Cervical Spine Wo Contrast  02/05/2015   CLINICAL DATA:  81 year old with recent fall. Head laceration. Complains of pain in the upper back.  EXAM: CT CERVICAL SPINE WITHOUT CONTRAST  TECHNIQUE: Multidetector CT imaging of the cervical spine was performed without intravenous contrast. Multiplanar CT image reconstructions were also generated.  COMPARISON:  01/25/2008  FINDINGS: Negative for an acute fracture dislocation in the cervical spine. There is mild sclerosis and deformity of the T3 superior endplate which appears new since 2009. There is mild retrolisthesis at C3-C4 with disc space narrowing. Bilateral facet arthropathy. No significant soft tissue edema. No significant neck lymphadenopathy.  Images of the upper lungs demonstrate a 2.7 x 1.1 cm opacity in the medial left upper lobe. This opacity contains some scattered calcifications. There are additional patchy opacities and calcifications in the right upper lung. There is also a small focal opacity in the medial superior segment of the left lower lobe measuring up to 1.5 cm.  IMPRESSION: Degenerative changes in cervical spine without acute bone abnormality.  Mild compression deformity of the T3 vertebral body of unknown age. Legrand Rams an old injury based on the sclerosis but recommend clinical correlation.  There are focal parenchymal densities or  lesions in the left upper lung, particularly in the left upper lobe. These appear to be new since 2009. Findings are nonspecific but a neoplastic process cannot be excluded. Recommend further characterization with a post contrast chest CT.  These results were called by telephone at the time of interpretation on 02/05/2015 at 8:42 am to [facility], PA-C, who verbally acknowledged these results.   Electronically Signed   By: Richarda Overlie M.D.   On: 02/05/2015 08:52   Mr Brain Wo Contrast  02/28/2015   CLINICAL DATA:  Altered mental status.  EXAM: MRI HEAD WITHOUT CONTRAST  TECHNIQUE: Multiplanar, multiecho pulse sequences of the brain and surrounding structures were obtained without intravenous contrast.  COMPARISON:  Head CT 02/21/2015  FINDINGS: A non expanded partially empty sella is incidentally noted. There is no evidence of acute infarct, intracranial hemorrhage, mass, midline shift, or extra-axial fluid collection. There is moderate generalized cerebral and cerebellar atrophy. Small, chronic cortical and subcortical infarcts are present in the posterior right frontal lobe and right parietal lobe. Subcortical and periventricular cerebral white matter T2 hyperintensities elsewhere are nonspecific but compatible with moderate chronic small vessel ischemic disease.  Prior bilateral cataract extraction is noted. Paranasal sinuses are clear. There is trace right mastoid fluid. Major intracranial vascular flow voids are preserved.  IMPRESSION: 1. No acute intracranial abnormality. 2. Moderate chronic small vessel ischemic disease and cerebral atrophy. 3. 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   Electronically Signed   By: Sebastian Ache M.D.   On: 02/28/2015 10:55   Dg Chest Port 1 View  02/24/2015   CLINICAL DATA:  Increased tracheal secretions, acute onset. Initial encounter.  EXAM: PORTABLE CHEST - 1 VIEW  COMPARISON:  Chest radiograph performed 02/21/2015  FINDINGS:  The lungs remain  hypoexpanded. Patchy bibasilar airspace opacities, right greater than left, raise concern for pneumonia, given the patient's symptoms. A small right pleural effusion is noted. No pneumothorax is seen.  The cardiomediastinal silhouette remains normal in size. No acute osseous abnormalities are identified.  IMPRESSION: Lungs hypoexpanded. Patchy bibasilar airspace opacities, right greater than left, raise concern for pneumonia given the patient's symptoms. Small right pleural effusion noted.   Electronically Signed   By: Roanna Raider M.D.   On: 02/24/2015 00:01   Dg Abd Portable 1v  02/24/2015   CLINICAL DATA:  NG tube placement  EXAM: PORTABLE ABDOMEN - 1 VIEW  COMPARISON:  No recent similar comparison exam  FINDINGS: Feeding tube tip terminates over the expected location of the gastric cardia. Right greater than left basilar lung field opacification is partly visualized. Normal bowel gas pattern in visualized aspects with bowel retained oral contrast.  IMPRESSION: Feeding tube tip terminates over the expected location of the gastric cardia.   Electronically Signed   By: Christiana Pellant M.D.   On: 02/24/2015 19:44   Dg Swallowing Func-speech Pathology  02/28/2015    Objective Swallowing Evaluation:   Modified Barium Swallow Patient Details  Name: Zeth Buday MRN: [id number] Date of Birth: 25-Sep-1926  Today's Date: 02/28/2015 Time: SLP Start Time (ACUTE ONLY): 1100-SLP Stop Time (ACUTE ONLY): 1137 SLP Time Calculation (min) (ACUTE ONLY): 37 min  Past Medical History:  Past Medical History  Diagnosis Date  . Diabetes mellitus without complication   . Hypertension   . Hypercholesteremia   . Heart murmur   . Frequent urination   . BPH (benign prostatic hyperplasia)   . GERD (gastroesophageal reflux disease)   . Coronary artery disease     medical management with aspirin  . Glaucoma    Past Surgical History:  Past Surgical History  Procedure Laterality Date  . Leg surgery  5 years ago    Rod in right leg  . Back  surgery  years ago    lower  . Hernia repair  years ago  . Cystoscopy with retrograde pyelogram, ureteroscopy and stent placement  Bilateral 12/12/2014    Procedure: CYSTO WITH BILATERAL RETROGRADE/BIOPSY WITH  FULGURATION/HYDRODISTENSION OF BLADDER;  Surgeon: Bjorn Pippin, MD;   Location: [facility];  Service: Urology;  Laterality: Bilateral;   HPI:  Other Pertinent Information: 81 y.o. male with a past medical history of  type 2 diabetes, hypertension, hyperlipidemia, heart murmur, BPH, GERD,  hernia repair, coronary artery disease, glaucoma who was brought in by his  son to the emergency department due to progressive weakness associated  with 4 falls in the last 9 days and confusion. CXR Patchy bibasilar  airspace opacities, right greater than left, raise concern for pneumonia  given the patient's symptoms. Small right pleural effusion noted. Head CT  negative. Found to have acute encephalopathy due to UTI. Repeat MBS for   No Data Recorded  Assessment / Plan / Recommendation CHL IP CLINICAL IMPRESSIONS 02/28/2015  Therapy Diagnosis Mild oral phase dysphagia;Moderate oral phase  dysphagia;Moderate cervical esophageal phase dysphagia;Severe cervical  esophageal phase dysphagia  Clinical Impression Pt exhibited minimally improved swallow function from  [facility] 8/29 with slight and flash penetration with puree although max  pyriform sinus and mod-max vallecular residue. Silent aspiration during  the swallow teaspoon honey thick  with therapeutic chin tuck ineffective  to prevent penetration/aspiration. Aspiration risk is high with any po's  at present, however Dys 1 (puree) texture and pudding thick liquids  recommended. Continue ST at SNF with likely initiation of honey thick  liquids with known risks as pt's family have stated no PEG and they may  want comfort feeds/liquids. Recommend sit upright, small bites, swallow  2-3 additional times after bites to clear residue and volitional throat  clear/cough after every several  bites, full supervision and crush meds.        CHL IP TREATMENT RECOMMENDATION 02/28/2015  Treatment Recommendations Therapy as outlined in treatment plan below     CHL IP DIET RECOMMENDATION 02/28/2015  SLP Diet Recommendations Dysphagia 1 (Puree)  Liquid Administration via (None)  Medication Administration Crushed with puree  Compensations Slow rate;Small sips/bites;Multiple dry swallows after each  bite/sip;Hard cough after swallow;Clear throat intermittently  Postural Changes and/or Swallow Maneuvers (None)     CHL IP OTHER RECOMMENDATIONS 02/28/2015  Recommended Consults (None)  Oral Care Recommendations Oral care QID  Other Recommendations (None)     CHL IP FOLLOW UP RECOMMENDATIONS 02/26/2015  Follow up Recommendations Skilled Nursing facility     [facility] IP FREQUENCY AND DURATION 02/28/2015  Speech Therapy Frequency (ACUTE ONLY) min 2x/week  Treatment Duration 2 weeks     Pertinent Vitals/Pain none    SLP Swallow Goals No flowsheet data found.  No flowsheet data found.    CHL IP REASON FOR REFERRAL 02/28/2015  Reason for Referral Objectively evaluate swallowing function               No flowsheet data found.         Royce Macadamia 02/28/2015, 2:35 PM   Breck Coons Lonell Face.Ed CCC-SLP Pager [phone]    Dg Swallowing Func-speech Pathology  02/24/2015    Objective Swallowing Evaluation:   Modified Barium Swallow Patient Details  Name: Kurt Navarro MRN: [id number] Date of Birth: 1926/09/08  Today's Date: 02/24/2015 Time: SLP Start Time (ACUTE ONLY): 1305-SLP Stop Time (ACUTE ONLY): 1328 SLP Time Calculation (min) (ACUTE ONLY): 23 min  Past Medical History:  Past Medical History  Diagnosis Date  . Diabetes mellitus without complication   . Hypertension   . Hypercholesteremia   . Heart murmur   . Frequent urination   . BPH (benign prostatic hyperplasia)   . GERD (gastroesophageal reflux disease)   . Coronary artery disease     medical management with aspirin  . Glaucoma    Past Surgical History:  Past Surgical History   Procedure Laterality Date  . Leg surgery  5 years ago    Rod in right leg  . Back surgery  years ago    lower  . Hernia repair  years ago  . Cystoscopy with retrograde pyelogram, ureteroscopy and stent placement  Bilateral 12/12/2014    Procedure: CYSTO WITH BILATERAL RETROGRADE/BIOPSY WITH  FULGURATION/HYDRODISTENSION OF BLADDER;  Surgeon: Bjorn Pippin, MD;   Location: [facility];  Service: Urology;  Laterality: Bilateral;   HPI:  Other Pertinent Information: 81 y.o. male with a past medical history of  type 2 diabetes, hypertension, hyperlipidemia, heart murmur, BPH, GERD,  hernia repair, coronary artery disease, glaucoma who was brought in by his  son to the emergency department due to progressive weakness associated  with 4 falls in the last 9 days and confusion. CXR Patchy bibasilar  airspace opacities, right greater than left, raise concern for pneumonia  given the patient's symptoms. Small right pleural effusion noted. Head CT  negative. Found to have acute encephalopathy due to UTI.  No Data Recorded  Assessment / Plan / Recommendation CHL IP CLINICAL IMPRESSIONS 02/24/2015  Therapy Diagnosis Mild oral phase dysphagia;Moderate pharyngeal phase  dysphagia;Severe pharyngeal phase dysphagia  Clinical Impression Delayed oral transit and cohesion across  consistencies. Moderate-severe sensorimotor based pharyngeal dysphagia and  decreased sensation with delayed swallow initiation. Laryngeal penetration  and aspiration (silent) across all consistencies assessed during initial  swallow and in attempts to clear mod-max vallecular and moderate pyriform  sinus residue. Inadequate sensation to residue and unable to safety reduce  with max cues for multiple swallows. Pt at significant aspiration risk at  present wth clinical findings and current confusion, recommend NPO,  temporary means of nutrition and oral care. ST will follow.        CHL IP TREATMENT RECOMMENDATION 02/24/2015  Treatment Recommendations Therapy as outlined in  treatment plan below     CHL IP DIET RECOMMENDATION 02/24/2015  SLP Diet Recommendations NPO;Alternative means - temporary  Liquid Administration via (None)  Medication Administration Via alternative means  Compensations (None)  Postural Changes and/or Swallow Maneuvers (None)     CHL IP OTHER RECOMMENDATIONS 02/24/2015  Recommended Consults (None)  Oral Care Recommendations Oral care QID  Other Recommendations (None)     No flowsheet data found.   CHL IP FREQUENCY AND DURATION 02/24/2015  Speech Therapy Frequency (ACUTE ONLY) min 2x/week  Treatment Duration 2 weeks     Pertinent Vitals/Pain none    SLP Swallow Goals No flowsheet data found.  No flowsheet data found.    CHL IP REASON FOR REFERRAL 02/24/2015  Reason for Referral Objectively evaluate swallowing function               No flowsheet data found.         Royce Macadamia 02/24/2015, 2:13 PM   Breck Coons Lonell Face.Ed CCC-SLP Pager [phone]      Dg Hip Unilat  With Pelvis 2-3 Views Right  02/21/2015   CLINICAL DATA:  Multiple falls over the past 10 days. Right hip pain and bruising. History of prior right intertrochanteric fracture. Initial encounter.  EXAM: DG HIP (WITH OR WITHOUT PELVIS) 2-3V RIGHT  COMPARISON:  Single view of the abdomen 12/26/2014.  FINDINGS: A hip screw and long IM nail are in place for fixation of a healed right intertrochanteric fracture. No acute fracture is identified. No notable degenerative change is identified. No focal bony lesion is seen.  IMPRESSION: No acute abnormality. Healed right intertrochanteric fracture with fixation hardware in place.   Electronically Signed   By: Drusilla Kanner M.D.   On: 02/21/2015 15:45    CBC  Recent Labs Lab 02/21/15 1719 02/22/15 0531 02/23/15 0600 02/26/15 0632 02/28/15 0604  WBC 7.7 6.7 8.4 9.7 8.1  HGB 11.7* 10.8* 11.6* 11.8* 11.9*  HCT 34.3* 31.6* 33.9* 35.1* 36.6*  PLT 314 305 334 316 317  MCV 89.8 89.5 90.4 90.0 91.5  MCH 30.6 30.6 30.9 30.3 29.8  MCHC 34.1 34.2 34.2  33.6 32.5  RDW 14.8 14.9 14.5 14.6 14.4  LYMPHSABS 0.9  --   --   --   --   MONOABS 0.7  --   --   --   --   EOSABS 0.1  --   --   --   --   BASOSABS 0.0  --   --   --   --     Chemistries   Recent Labs Lab 02/21/15 1719 02/22/15 0531 02/23/15 0600 02/25/15 0720 02/26/15 0632 02/27/15 0653 02/28/15 0604  NA 132* 132* 132* 130* 132* 133* 135  K 3.9 3.8 4.1  3.9 3.5 3.4* 3.6  CL 96* 98* 95* 97* 97* 96* 97*  CO2 27 26 29 24 26 29 31   GLUCOSE 107* 116* 137* 121* 203* 234* 229*  BUN 15 10 8 10 13 13 14   CREATININE 0.77 0.81 0.87 0.86 0.93 0.86 0.84  CALCIUM 8.9 8.6* 9.3 8.8* 8.5* 8.5* 8.9  MG  --   --   --   --   --  2.0  --   AST 19 16  --   --   --   --  15  ALT 15* 14*  --   --   --   --  25  ALKPHOS 66 61  --   --   --   --  83  BILITOT 0.5 0.5  --   --   --   --  0.4   ------------------------------------------------------------------------------------------------------------------ estimated creatinine clearance is 53.8 mL/min (by C-G formula based on Cr of 0.84). ------------------------------------------------------------------------------------------------------------------ No results for input(s): HGBA1C in the last 72 hours. ------------------------------------------------------------------------------------------------------------------ No results for input(s): CHOL, HDL, LDLCALC, TRIG, CHOLHDL, LDLDIRECT in the last 72 hours. ------------------------------------------------------------------------------------------------------------------  Recent Labs  02/26/15 1216  TSH 2.867   ------------------------------------------------------------------------------------------------------------------ No results for input(s): VITAMINB12, FOLATE, FERRITIN, TIBC, IRON, RETICCTPCT in the last 72 hours.  Coagulation profile  Recent Labs Lab 02/28/15 0604  INR 1.06    No results for input(s): DDIMER in the last 72 hours.  Cardiac Enzymes No results for input(s):  CKMB, TROPONINI, MYOGLOBIN in the last 168 hours.  Invalid input(s): CK ------------------------------------------------------------------------------------------------------------------ Invalid input(s): POCBNP    Danylah Holden MD PhD. on 02/28/2015 at 4:57 PM  Between 7am to 7pm - Pager - [phone]  After 7pm go to [email or web address] - password [email or web address]  And look for the night coverage person covering for me after hours  [facility] Office  [phone]

## 2015-02-28 NOTE — Progress Notes (Signed)
Inpatient Diabetes Program Recommendations  AACE/ADA: New Consensus Statement on Inpatient Glycemic Control (2013)  Target Ranges:  Prepandial:   less than 140 mg/dL      Peak postprandial:   less than 180 mg/dL (1-2 hours)      Critically ill patients:  140 - 180 mg/dL    Results for Kurt Navarro, Kurt Navarro (MRN [id number]) as of 02/28/2015 09:35  Ref. Range 02/27/2015 08:17 02/27/2015 11:43 02/27/2015 16:06 02/27/2015 20:08  Glucose-Capillary Latest Ref Range: 65-99 mg/dL 409 (H) 811 (H) 914 (H) 203 (H)    Current DM Orders: Lantus 10 units QHS            Novolog Sensitive SSI (0-9 units) TID AC + HS    -Current A1c pending.  -Note patient also getting Jevity tube feeds @ 70cc/hour.     MD- Please consider changing Novolog SSI to Moderate scale Q4 hours since patient now getting continuous tube feeds     Will follow Ambrose Finland RN, MSN, CDE Diabetes Coordinator Inpatient Glycemic Control Team Team Pager: [phone] (8a-5p)

## 2015-02-28 NOTE — Consult Note (Signed)
Consultation Note Date: 02/28/2015   Patient Name: Kurt Navarro  DOB: [birth year]  MRN: [id number]  Age / Sex: 81 y.o., male   PCP: Thressa Sheller, MD Referring Physician: Florencia Reasons, MD  Reason for Consultation: Establishing goals of care  Palliative Care Assessment and Plan Summary of Established Goals of Care and Medical Treatment Preferences   Clinical Assessment/Narrative: Patient is an 81 year old man with a past medical history of type 2 diabetes hypertension hyperlipidemia heart murmur BPH GERD coronary artery disease and glaucoma who was brought by his son to the emergency  Department due to progressive weakness. Patient had fallen 4 times in the last 9 days. Per family he had been getting more confused. Prior to the past 2 weeks family describes the patient has been alert and oriented 3. His CT scan was negative for any acute abnormalities but he did have a urinary tract infection. Patient initially failed a modified barium swallow. He has had a temporary feeding tube since that time. On 02/28/2015, he underwent another swallow evaluation and this time was found to be safe to eat pured foods. His chest x-ray showed pneumonia but also of concern if this was aspiration pneumonia.Family has been very conflicted about patient's CODE STATUS and also whether to go forward with a feeding tube. Focus of today's initial consult was on information regarding disease progression secondary to dementia and debility. Discussed in length aspiration pneumonia, full code, DO NOT RESUSCITATE, and goals relevant to patient and family.  Contacts/Participants in Discussion: Primary Decision Maker: Patient's spouse   HCPOA: yes  Present for today's meeting his son Larrie Fraizer, wife, and granddaughter  Code Status/Advance Care Planning:  DO NOT RESUSCITATE  Hopeful for improvement. Discharge to skilled nursing facility for rehabilitation but family realistic that pt may not get back to his  baseline  Discussed hospice benefit. Discussed inpatient benefit versus outpatient hospice benefit  No feeding tubes  Family still discussing whether patient would want to have another episode of pneumonia treated with antibiotics especially if inpatient hospitalization was required.  Symptom Management:   Patient denies shortness of breath and pain  Secretions: Patient has upper airway secretions, hopeful that this will abate wants Panda tube is discontinued   Additional Recommendations (Limitations, Scope, Preferences):  Discussed hospice referral after skilled needs met Psycho-social/Spiritual:   Support System: Yes  Desire for further Chaplaincy support:no  Prognosis: < 6 months. Patient at high risk for recurrent aspiration pneumonia and subsequent respiratory failure  Discharge Planning:  [facility] for rehab with Palliative care service follow-up       Chief Complaint/History of Present Illness: Patient is an 81 year old man admitted to the emergency department secondary to progressive weakness associated with 4 falls in the last 9 days and confusion. He was found to have a urinary tract infection. Chest x-ray also shows by basilar airspace disease; questionable pneumonia versus aspiration pneumonia.  Primary Diagnoses  Present on Admission:  . Altered mental status . Pressure ulcer . Hyperlipidemia . Essential hypertension . BPH (benign prostatic hypertrophy)  Palliative Review of Systems: Patient still somewhat confused, and he denies pain and shortness of breath I have reviewed the medical record, interviewed the patient and family, and examined the patient. The following aspects are pertinent.  Past Medical History  Diagnosis Date  . Diabetes mellitus without complication   . Hypertension   . Hypercholesteremia   . Heart murmur   . Frequent urination   . BPH (benign prostatic hyperplasia)   . GERD (gastroesophageal  reflux disease)   .  Coronary artery disease     medical management with aspirin  . Glaucoma    Social History   Social History  . Marital Status: Married    Spouse Name: N/A  . Number of Children: N/A  . Years of Education: N/A   Social History Main Topics  . Smoking status: Former Smoker -- 1.00 packs/day for 30 years    Types: Cigarettes    Quit date: 06/28/1976  . Smokeless tobacco: Never Used  . Alcohol Use: No  . Drug Use: No  . Sexual Activity: Not Asked   Other Topics Concern  . None   Social History Narrative   No family history on file. Scheduled Meds: . enoxaparin (LOVENOX) injection  40 mg Subcutaneous Q24H  . ferrous sulfate  300 mg Per Tube BID WC  . insulin aspart  0-5 Units Subcutaneous QHS  . insulin aspart  0-9 Units Subcutaneous TID WC  . insulin glargine  10 Units Subcutaneous QHS  . latanoprost  1 drop Both Eyes QHS  . levothyroxine  50 mcg Per Tube QAC breakfast  . losartan  100 mg Per Tube Daily  . multivitamin with minerals  1 tablet Per Tube Daily  . pantoprazole sodium  40 mg Per Tube Daily  . pravastatin  20 mg Per Tube QPM   Continuous Infusions: . feeding supplement (JEVITY 1.2 CAL) Stopped (02/28/15 0940)   PRN Meds:.RESOURCE THICKENUP CLEAR Medications Prior to Admission:  Prior to Admission medications   Medication Sig Start Date End Date Taking? Authorizing Provider  aspirin 325 MG tablet Take 325 mg by mouth daily.   Yes Historical Provider, MD  ferrous sulfate 325 (65 FE) MG EC tablet Take 325 mg by mouth 2 (two) times daily.   Yes Historical Provider, MD  finasteride (PROSCAR) 5 MG tablet Take 5 mg by mouth daily.   Yes Historical Provider, MD  ibandronate (BONIVA) 150 MG tablet Take 150 mg by mouth every 30 (thirty) days. 11/12/14  Yes Historical Provider, MD  levothyroxine (SYNTHROID, LEVOTHROID) 50 MCG tablet Take 50 mcg by mouth daily before breakfast.   Yes Historical Provider, MD  losartan (COZAAR) 100 MG tablet Take 100 mg by mouth daily.   Yes  Historical Provider, MD  metFORMIN (GLUCOPHAGE) 500 MG tablet Take 500 mg by mouth 4 (four) times daily -  with meals and at bedtime.   Yes Historical Provider, MD  Multiple Vitamin (MULTIVITAMIN WITH MINERALS) TABS tablet Take 1 tablet by mouth daily.   Yes Historical Provider, MD  Multiple Vitamins-Minerals (ICAPS PO) Take 1 capsule by mouth 2 (two) times daily.   Yes Historical Provider, MD  naproxen sodium (ANAPROX) 220 MG tablet Take 220 mg by mouth 2 (two) times daily as needed (pain).   Yes Historical Provider, MD  omeprazole (PRILOSEC) 20 MG capsule Take 40 mg by mouth 2 (two) times daily.    Yes Historical Provider, MD  pravastatin (PRAVACHOL) 20 MG tablet Take 20 mg by mouth every evening.    Yes Historical Provider, MD  tamsulosin (FLOMAX) 0.4 MG CAPS capsule Take 0.4 mg by mouth daily after supper.    Yes Historical Provider, MD  Travoprost, BAK Free, (TRAVATAN) 0.004 % SOLN ophthalmic solution Place 1 drop into both eyes at bedtime.   Yes Historical Provider, MD  HYDROcodone-acetaminophen (NORCO/VICODIN) 5-325 MG per tablet Take 1-2 tablets by mouth every 6 (six) hours as needed. Patient not taking: Reported on 02/21/2015 02/05/15   Montine Circle,  PA-C  phenazopyridine (PYRIDIUM) 100 MG tablet Take 1 tablet (100 mg total) by mouth 3 (three) times daily as needed for pain. Patient not taking: Reported on 01/09/2015 12/12/14   Irine Seal, MD  traMADol (ULTRAM) 50 MG tablet Take 1 tablet (50 mg total) by mouth every 6 (six) hours as needed. Patient not taking: Reported on 01/09/2015 12/12/14   Irine Seal, MD   No Known Allergies CBC:    Component Value Date/Time   WBC 8.1 02/28/2015 0604   HGB 11.9* 02/28/2015 0604   HCT 36.6* 02/28/2015 0604   PLT 317 02/28/2015 0604   MCV 91.5 02/28/2015 0604   NEUTROABS 5.9 02/21/2015 1719   LYMPHSABS 0.9 02/21/2015 1719   MONOABS 0.7 02/21/2015 1719   EOSABS 0.1 02/21/2015 1719   BASOSABS 0.0 02/21/2015 1719   Comprehensive Metabolic Panel:     Component Value Date/Time   NA 135 02/28/2015 0604   K 3.6 02/28/2015 0604   CL 97* 02/28/2015 0604   CO2 31 02/28/2015 0604   BUN 14 02/28/2015 0604   CREATININE 0.84 02/28/2015 0604   GLUCOSE 229* 02/28/2015 0604   CALCIUM 8.9 02/28/2015 0604   AST 15 02/28/2015 0604   ALT 25 02/28/2015 0604   ALKPHOS 83 02/28/2015 0604   BILITOT 0.4 02/28/2015 0604   PROT 5.9* 02/28/2015 0604   ALBUMIN 2.7* 02/28/2015 0604    Physical Exam: Vital Signs: BP 164/70 mmHg  Pulse 72  Temp(Src) 99.3 F (37.4 C) (Oral)  Resp 18  Ht $R'5\' 5"'pP$  (1.651 m)  Wt 60.2 kg (132 lb 11.5 oz)  BMI 22.09 kg/m2  SpO2 96% SpO2: SpO2: 96 % O2 Device: O2 Device: Not Delivered O2 Flow Rate:   Intake/output summary:  Intake/Output Summary (Last 24 hours) at 02/28/15 1324 Last data filed at 02/28/15 3762  Gross per 24 hour  Intake     90 ml  Output   1000 ml  Net   -910 ml   LBM: Last BM Date: 02/26/15 Baseline Weight: Weight: 65.4 kg (144 lb 2.9 oz) Most recent weight: Weight: 60.2 kg (132 lb 11.5 oz)  Exam Findings:  General: Frail elderly man lying in bed Respiratory: Upper airway secretions; no work of breathing observed Neuro: Patient is somnolent but awakens easily to voice. Mild confusion noted. Oriented to self. Understands the results of his modified barium swallow         Palliative Performance Scale: 30-40%              Additional Data Reviewed: Recent Labs     02/26/15  0632  02/27/15  0653  02/28/15  0604  WBC  9.7   --   8.1  HGB  11.8*   --   11.9*  PLT  316   --   317  NA  132*  133*  135  BUN  $Re'13  13  14  'Lmg$ CREATININE  0.93  0.86  0.84     Time In: 11:30 Time Out: 1245 Time Total: 75 minutes Greater than 50%  of this time was spent counseling and coordinating care related to the above assessment and plan. Discussed with Dr. Erlinda Hong  Signed by: Dory Horn, NP  Dory Horn, NP  02/28/2015, 1:24 PM  Please contact Palliative Medicine Team phone at [phone] for  questions and concerns.

## 2015-03-01 DIAGNOSIS — I10 Essential (primary) hypertension: Secondary | ICD-10-CM

## 2015-03-01 DIAGNOSIS — R1310 Dysphagia, unspecified: Secondary | ICD-10-CM | POA: Insufficient documentation

## 2015-03-01 LAB — GLUCOSE, CAPILLARY
GLUCOSE-CAPILLARY: 118 mg/dL — AB (ref 65–99)
GLUCOSE-CAPILLARY: 211 mg/dL — AB (ref 65–99)
Glucose-Capillary: 164 mg/dL — ABNORMAL HIGH (ref 65–99)
Glucose-Capillary: 211 mg/dL — ABNORMAL HIGH (ref 65–99)

## 2015-03-01 LAB — HEMOGLOBIN A1C
HEMOGLOBIN A1C: 6.4 % — AB (ref 4.8–5.6)
Mean Plasma Glucose: 137 mg/dL

## 2015-03-01 MED ORDER — METOPROLOL TARTRATE 25 MG PO TABS: 25.0000 mg | ORAL_TABLET | Freq: Two times a day (BID) | ORAL | Status: DC

## 2015-03-01 MED ORDER — METOPROLOL TARTRATE 25 MG PO TABS
25.0000 mg | ORAL_TABLET | Freq: Two times a day (BID) | ORAL | Status: DC
  Administered 2015-03-01 – 2015-03-03 (×4): 25 mg via ORAL
  Filled 2015-03-01 (×4): qty 1

## 2015-03-01 MED ORDER — JEVITY 1.2 CAL PO LIQD: 1000.0000 mL | ORAL | Status: DC

## 2015-03-01 MED ORDER — AMOXICILLIN-POT CLAVULANATE 875-125 MG PO TABS: 1.0000 | ORAL_TABLET | Freq: Two times a day (BID) | ORAL | Status: DC

## 2015-03-01 MED ORDER — FLORANEX PO PACK: 1.0000 g | PACK | Freq: Three times a day (TID) | ORAL | Status: DC

## 2015-03-01 MED ORDER — FLORANEX PO PACK
1.0000 g | PACK | Freq: Three times a day (TID) | ORAL | Status: DC
  Administered 2015-03-01 (×2): 1 g via ORAL
  Filled 2015-03-01 (×6): qty 1

## 2015-03-01 MED ORDER — RESOURCE THICKENUP CLEAR PO POWD: ORAL | Status: DC

## 2015-03-01 NOTE — Progress Notes (Signed)
Consultation Note Date: 03/01/2015   Patient Name: Kurt Navarro  DOB: 08/09/1926  MRN: [id number]  Age / Sex: 81 y.o., male   PCP: Thressa Sheller, MD Referring Physician: Florencia Reasons, MD  Reason for Consultation: Continuing goals of care; hospice in-pt referral  Palliative Care Assessment and Plan Summary of Established Goals of Care and Medical Treatment Preferences   Clinical Assessment/Narrative: Family initially hopeful for improvement for patient to go to rehabilitation. They have been struggling with end-of-life decisions. He looks clinically different today in that he is lethargic, delayed speech, almost a faraway look in his eyes. He did eat some breakfast and lunch today. He had to be fed. He was observed to choke after bites. His son and wife, stated he had a bad episode last night of choking and are questioning whether he aspirated again last night. After seeing him today they are less optimistic that he would be able to rehab and are asking about hospice options which does seem more appropriate in terms of what we are seeing today. Dr. Erlinda Hong also in agreement that patient is not a good rehabilitation candidate at this point.   Contacts/Participants in Discussion: Primary Decision Maker: Wife and son   HCPOA: yes  Met with wife and son today  Code Status/Advance Care Planning:  DO NOT RESUSCITATE  Family wishes to pursue hospice inpatient for end-of-life care  Symptom Management:   Dyspnea: Patient denies dyspnea. He has very poor air movement. We'll monitor for need to initiate opioids. Continue oxygen and targeted pulmonary therapies for now  Secretions: Patient is still having some brief periods of alertness he has been very hungry and thirsty after being nothing by mouth. Family wishes him to have comfort foods when he is awake but he is becoming congested. We'll monitor for need to add Robinul or atropine when necessary  Additional Recommendations (Limitations, Scope,  Preferences):  Hospice inpatient Psycho-social/Spiritual:   Support System: Yes  Desire for further Chaplaincy support:yes  Prognosis: Hours - Days. Question if patient had an acute aspiration event last night.  Discharge Planning:  Hospice facility       Chief Complaint/History of Present Illness: Patient is an 81 year old man admitted to [facility] after a period of progressive weakness associated with 4 falls in 9 days and worsening confusion. Prior to this reportedly patient was alert and oriented 3. He was found to have pneumonia.  Primary Diagnoses  Present on Admission:  . Altered mental status . Pressure ulcer . Hyperlipidemia . Essential hypertension . BPH (benign prostatic hypertrophy)  Palliative Review of Systems: Lethargic I have reviewed the medical record, interviewed the patient and family, and examined the patient. The following aspects are pertinent.  Past Medical History  Diagnosis Date  . Diabetes mellitus without complication   . Hypertension   . Hypercholesteremia   . Heart murmur   . Frequent urination   . BPH (benign prostatic hyperplasia)   . GERD (gastroesophageal reflux disease)   . Coronary artery disease     medical management with aspirin  . Glaucoma    Social History   Social History  . Marital Status: Married    Spouse Name: N/A  . Number of Children: N/A  . Years of Education: N/A   Social History Main Topics  . Smoking status: Former Smoker -- 1.00 packs/day for 30 years    Types: Cigarettes    Quit date: 06/28/1976  . Smokeless tobacco: Never Used  . Alcohol Use: No  . Drug  Use: No  . Sexual Activity: Not Asked   Other Topics Concern  . None   Social History Narrative   No family history on file. Scheduled Meds: . enoxaparin (LOVENOX) injection  40 mg Subcutaneous Q24H  . ferrous sulfate  300 mg Per Tube BID WC  . insulin aspart  0-5 Units Subcutaneous QHS  . insulin aspart  0-9 Units Subcutaneous TID  WC  . insulin glargine  10 Units Subcutaneous QHS  . lactobacillus  1 g Oral TID WC  . latanoprost  1 drop Both Eyes QHS  . levothyroxine  50 mcg Per Tube QAC breakfast  . losartan  100 mg Per Tube Daily  . metoprolol tartrate  25 mg Oral BID  . multivitamin with minerals  1 tablet Per Tube Daily  . pantoprazole sodium  40 mg Per Tube Daily  . pravastatin  20 mg Per Tube QPM   Continuous Infusions: . feeding supplement (JEVITY 1.2 CAL) Stopped (02/28/15 0940)   PRN Meds:.RESOURCE THICKENUP CLEAR Medications Prior to Admission:  Prior to Admission medications   Medication Sig Start Date End Date Taking? Authorizing Provider  aspirin 325 MG tablet Take 325 mg by mouth daily.   Yes Historical Provider, MD  ferrous sulfate 325 (65 FE) MG EC tablet Take 325 mg by mouth 2 (two) times daily.   Yes Historical Provider, MD  finasteride (PROSCAR) 5 MG tablet Take 5 mg by mouth daily.   Yes Historical Provider, MD  ibandronate (BONIVA) 150 MG tablet Take 150 mg by mouth every 30 (thirty) days. 11/12/14  Yes Historical Provider, MD  levothyroxine (SYNTHROID, LEVOTHROID) 50 MCG tablet Take 50 mcg by mouth daily before breakfast.   Yes Historical Provider, MD  losartan (COZAAR) 100 MG tablet Take 100 mg by mouth daily.   Yes Historical Provider, MD  metFORMIN (GLUCOPHAGE) 500 MG tablet Take 500 mg by mouth 4 (four) times daily -  with meals and at bedtime.   Yes Historical Provider, MD  Multiple Vitamin (MULTIVITAMIN WITH MINERALS) TABS tablet Take 1 tablet by mouth daily.   Yes Historical Provider, MD  Multiple Vitamins-Minerals (ICAPS PO) Take 1 capsule by mouth 2 (two) times daily.   Yes Historical Provider, MD  naproxen sodium (ANAPROX) 220 MG tablet Take 220 mg by mouth 2 (two) times daily as needed (pain).   Yes Historical Provider, MD  omeprazole (PRILOSEC) 20 MG capsule Take 40 mg by mouth 2 (two) times daily.    Yes Historical Provider, MD  pravastatin (PRAVACHOL) 20 MG tablet Take 20 mg by  mouth every evening.    Yes Historical Provider, MD  tamsulosin (FLOMAX) 0.4 MG CAPS capsule Take 0.4 mg by mouth daily after supper.    Yes Historical Provider, MD  Travoprost, BAK Free, (TRAVATAN) 0.004 % SOLN ophthalmic solution Place 1 drop into both eyes at bedtime.   Yes Historical Provider, MD  amoxicillin-clavulanate (AUGMENTIN) 875-125 MG per tablet Take 1 tablet by mouth 2 (two) times daily. 03/01/15   Florencia Reasons, MD  HYDROcodone-acetaminophen (NORCO/VICODIN) 5-325 MG per tablet Take 1-2 tablets by mouth every 6 (six) hours as needed. Patient not taking: Reported on 02/21/2015 02/05/15   Montine Circle, PA-C  lactobacillus (FLORANEX/LACTINEX) PACK Take 1 packet (1 g total) by mouth 3 (three) times daily with meals. 03/01/15   Florencia Reasons, MD  Maltodextrin-Xanthan Gum (RESOURCE THICKENUP CLEAR) POWD Nectar thick liquid tid 03/01/15   Florencia Reasons, MD  metoprolol tartrate (LOPRESSOR) 25 MG tablet Take 1 tablet (25 mg  total) by mouth 2 (two) times daily. 03/01/15   Florencia Reasons, MD  Nutritional Supplements (FEEDING SUPPLEMENT, JEVITY 1.2 CAL,) LIQD Place 1,000 mLs into feeding tube continuous. 03/01/15   Florencia Reasons, MD  phenazopyridine (PYRIDIUM) 100 MG tablet Take 1 tablet (100 mg total) by mouth 3 (three) times daily as needed for pain. Patient not taking: Reported on 01/09/2015 12/12/14   Irine Seal, MD  traMADol (ULTRAM) 50 MG tablet Take 1 tablet (50 mg total) by mouth every 6 (six) hours as needed. Patient not taking: Reported on 01/09/2015 12/12/14   Irine Seal, MD   No Known Allergies CBC:    Component Value Date/Time   WBC 8.1 02/28/2015 0604   HGB 11.9* 02/28/2015 0604   HCT 36.6* 02/28/2015 0604   PLT 317 02/28/2015 0604   MCV 91.5 02/28/2015 0604   NEUTROABS 5.9 02/21/2015 1719   LYMPHSABS 0.9 02/21/2015 1719   MONOABS 0.7 02/21/2015 1719   EOSABS 0.1 02/21/2015 1719   BASOSABS 0.0 02/21/2015 1719   Comprehensive Metabolic Panel:    Component Value Date/Time   NA 135 02/28/2015 0604   K 3.6  02/28/2015 0604   CL 97* 02/28/2015 0604   CO2 31 02/28/2015 0604   BUN 14 02/28/2015 0604   CREATININE 0.84 02/28/2015 0604   GLUCOSE 229* 02/28/2015 0604   CALCIUM 8.9 02/28/2015 0604   AST 15 02/28/2015 0604   ALT 25 02/28/2015 0604   ALKPHOS 83 02/28/2015 0604   BILITOT 0.4 02/28/2015 0604   PROT 5.9* 02/28/2015 0604   ALBUMIN 2.7* 02/28/2015 0604    Physical Exam: Vital Signs: BP 163/89 mmHg  Pulse 96  Temp(Src) 99.6 F (37.6 C) (Oral)  Resp 20  Ht $R'5\' 5"'gv$  (1.651 m)  Wt 60.1 kg (132 lb 7.9 oz)  BMI 22.05 kg/m2  SpO2 95% SpO2: SpO2: 95 % O2 Device: O2 Device: Not Delivered O2 Flow Rate:   Intake/output summary:  Intake/Output Summary (Last 24 hours) at 03/01/15 1900 Last data filed at 03/01/15 0700  Gross per 24 hour  Intake      0 ml  Output    800 ml  Net   -800 ml   LBM: Last BM Date: 02/26/15 Baseline Weight: Weight: 65.4 kg (144 lb 2.9 oz) Most recent weight: Weight: 60.1 kg (132 lb 7.9 oz)  Exam Findings:  General: Elderly cachectic acutely ill man Respiratory: Very diminished breath sounds throughout with mild work of breathing noted Neuro: Patient seems to recognize his family. His voice is only a whisper. He is lethargic. He awakens to voice and attempts to answer questions. Very delayed speech.         Palliative Performance Scale: 30%              Additional Data Reviewed: Recent Labs     02/27/15  0653  02/28/15  0604  WBC   --   8.1  HGB   --   11.9*  PLT   --   317  NA  133*  135  BUN  13  14  CREATININE  0.86  0.84     Time In: 1500 Time Out: 1540 Time Total: 40 minutes Greater than 50%  of this time was spent counseling and coordinating care related to the above assessment and plan.  Signed by: Dory Horn, NP  Dory Horn, NP  03/01/2015, 7:00 PM  Please contact Palliative Medicine Team phone at [phone] for questions and concerns.

## 2015-03-01 NOTE — Discharge Summary (Signed)
Discharge Summary  Kurt Navarro ZOX:[id number] DOB: 12-Jan-1927  PCP: Thayer Headings, MD  Admit date: 02/21/2015 Discharge date: 03/01/2015  Time spent: >65mins  Recommendations for Outpatient Follow-up:  1. F/u with PMD within a week for hospital discharge follow up  Discharge Diagnoses:  Active Hospital Problems   Diagnosis Date Noted  . Altered mental status 02/21/2015  . Palliative care encounter   . Increased tracheal secretions   . Dysphagia, pharyngoesophageal phase   . FTT (failure to thrive) in adult   . Pressure ulcer 02/21/2015  . Hyperlipidemia 02/21/2015  . Essential hypertension 02/21/2015  . BPH (benign prostatic hypertrophy) 02/21/2015  . Diabetes mellitus, type 2 02/21/2015    Resolved Hospital Problems   Diagnosis Date Noted Date Resolved  No resolved problems to display.    Discharge Condition: stable  Diet recommendation: puree diet/pudding thick liquid, crush meds  Filed Weights   02/27/15 0431 02/28/15 1952 03/01/15 0557  Weight: 132 lb 11.5 oz (60.2 kg) 132 lb 4.4 oz (60 kg) 132 lb 7.9 oz (60.1 kg)    History of present illness:  Kurt Navarro is a 81 y.o. male with a past medical history of type 2 diabetes, hypertension, hyperlipidemia, heart murmur, BPH, GERD, coronary artery disease, glaucoma who was brought in by his son to the emergency department due to progressive weakness associated with 4 falls in the last 9 days and confusion. Apparently the patient has not hit his head, he hasn't have fever or chills, but he is usually alert oriented 3 and has been unable to tell the current date even down to the year, as well as being confused with other events for the past week plus.He is he is oriented to place and is currently in no acute distress. He is unable to provide further details on his history.  Hospital Course:  Principal Problem:   Altered mental status Active Problems:   Pressure ulcer   Hyperlipidemia   Essential hypertension   BPH  (benign prostatic hypertrophy)   Diabetes mellitus, type 2   Increased tracheal secretions   Dysphagia, pharyngoesophageal phase   FTT (failure to thrive) in adult   Palliative care encounter  Acute encephalopathy secondary enterococcus UTI/aspiration Pneumonia -CT head: No acute intracranial abnormality  -CXR 8/26: Decreased lung volumes with increased bibasilar atelectasis, questionable small right pleural effusion, CXR 8/28: Has a bi-basilar airspace opacities, concerning for pneumonia, pneumonia likely secondary to aspiration. Strep pneumonia urine antigen negative, legionella urine antigen negative  -UA: WBC TNTC, Large leukocytes , Urine culture >100K Enterococcus, finished treatment with IV vancomycin  --finished treatment with zosyn for possible aspiration pna., patient and family aware of and accept the risk of recurrent aspiration pneumonia, patient want to eat, they has not decided whether to treat with abx if recurrent aspiration pneumonia. Patient is discharged with days of augmentin.  -confusion resolved, no oxygen requirement at discharge  Dysphagia -has been npo since admission, has been on tube feeds through [facility] tube, nutrition and speech input appreciated. -family and patient hanges his minds at times about peg tube placement initially, IR was consulted for peg tube placement initially, later family and patient decided no feeding tube. -family coordinated with speechtherapy for repeat swallow eval on 9/2,   -9/2 repeated swallow eval, showed risk of aspiration , family and patient are willing to take the risk of aspiration, now on puree diet and putting thick liquid, ng removed, appreciate palliative team intput, patient now DNR/DNI, no feeding tube, they agree with SNF placement and  continue outpatient palliative care consult, patient and family has not decided whether to treat with abx if recurrent aspiration pneumonia.    Fall/physical deconditioning/failure to  thrive -family reported progressive weakness and multiple falls in the last month PT consulted and appreciated, recommended SNF -SNF with outpatient palliative care  Diabetes mellitus, type 2, noninsulin dependent -Metformin held in the hospital, restarted at discharge -a1c 6.4  Hyperlipidemia -Continue statin  Essential hypertension -Continue Cozaar, added low dose lopressor  BPH -Continue Proscar, Flomax  Hypothyroidism -Continue Synthroid  Code Status: DNR/DNI  Family Communication: Patient and wife in room  Disposition Plan: SNF    Procedures  Modified Barium swallow  Consults  Palliative care Interventional radiology for possible peg tube placement, later was cancelled, family later decided not feeding tube    Discharge Exam: BP 169/88 mmHg  Pulse 99  Temp(Src) 99.6 F (37.6 C) (Oral)  Resp 20  Ht 5\' 5"  (1.651 m)  Wt 132 lb 7.9 oz (60.1 kg)  BMI 22.05 kg/m2  SpO2 93%   General: frail, elderly, chronically ill-appearing, NAD  HEENT: NCAT, mucous membranes moist.   Cardiovascular: S1 S2 auscultated, RRR  Respiratory: Diminished breath sounds, Course at bases.  Abdomen: Soft, nontender, nondistended, + bowel sounds  Extremities: warm dry without cyanosis clubbing or edema  Neuro: AAOx3, Nonfocal. Able to lift legs against gravity, upper extremity strength 4/5.  Skin: bruising and ecchymosis noted on the extremities.   Discharge Instructions You were cared for by a hospitalist during your hospital stay. If you have any questions about your discharge medications or the care you received while you were in the hospital after you are discharged, you can call the unit and asked to speak with the hospitalist on call if the hospitalist that took care of you is not available. Once you are discharged, your primary care physician will handle any further medical issues. Please note that NO REFILLS for any discharge medications will be authorized once  you are discharged, as it is imperative that you return to your primary care physician (or establish a relationship with a primary care physician if you do not have one) for your aftercare needs so that they can reassess your need for medications and monitor your lab values.      Discharge Instructions    DIET - DYS 1    Complete by:  As directed   Fluid consistency:  Pudding Thick     Diet - low sodium heart healthy    Complete by:  As directed      Increase activity slowly    Complete by:  As directed      Increase activity slowly    Complete by:  As directed             Medication List    STOP taking these medications        HYDROcodone-acetaminophen 5-325 MG per tablet  Commonly known as:  NORCO/VICODIN     ICAPS PO     naproxen sodium 220 MG tablet  Commonly known as:  ANAPROX     phenazopyridine 100 MG tablet  Commonly known as:  PYRIDIUM     pravastatin 20 MG tablet  Commonly known as:  PRAVACHOL     traMADol 50 MG tablet  Commonly known as:  ULTRAM      TAKE these medications        amoxicillin-clavulanate 875-125 MG per tablet  Commonly known as:  AUGMENTIN  Take 1 tablet by mouth 2 (two)  times daily.     aspirin 325 MG tablet  Take 325 mg by mouth daily.     feeding supplement (JEVITY 1.2 CAL) Liqd  Place 1,000 mLs into feeding tube continuous.     ferrous sulfate 325 (65 FE) MG EC tablet  Take 325 mg by mouth 2 (two) times daily.     finasteride 5 MG tablet  Commonly known as:  PROSCAR  Take 5 mg by mouth daily.     ibandronate 150 MG tablet  Commonly known as:  BONIVA  Take 150 mg by mouth every 30 (thirty) days.     lactobacillus Pack  Take 1 packet (1 g total) by mouth 3 (three) times daily with meals.     levothyroxine 50 MCG tablet  Commonly known as:  SYNTHROID, LEVOTHROID  Take 50 mcg by mouth daily before breakfast.     losartan 100 MG tablet  Commonly known as:  COZAAR  Take 100 mg by mouth daily.     metFORMIN 500 MG  tablet  Commonly known as:  GLUCOPHAGE  Take 500 mg by mouth 4 (four) times daily -  with meals and at bedtime.     metoprolol tartrate 25 MG tablet  Commonly known as:  LOPRESSOR  Take 1 tablet (25 mg total) by mouth 2 (two) times daily.     multivitamin with minerals Tabs tablet  Take 1 tablet by mouth daily.     omeprazole 20 MG capsule  Commonly known as:  PRILOSEC  Take 40 mg by mouth 2 (two) times daily.     RESOURCE THICKENUP CLEAR Powd  Nectar thick liquid tid     tamsulosin 0.4 MG Caps capsule  Commonly known as:  FLOMAX  Take 0.4 mg by mouth daily after supper.     Travoprost (BAK Free) 0.004 % Soln ophthalmic solution  Commonly known as:  TRAVATAN  Place 1 drop into both eyes at bedtime.       No Known Allergies Follow-up Information    Follow up with Thayer Headings, MD In 1 week.   Specialty:  Internal Medicine   Why:  hospital discharge follow up   Contact information:   [address] [place] [address] [phone]        The results of significant diagnostics from this hospitalization (including imaging, microbiology, ancillary and laboratory) are listed below for reference.    Significant Diagnostic Studies: Dg Chest 2 View  02/21/2015   CLINICAL DATA:  Multiple falls yesterday, increased confusion, incontinence, decreased appetite, history hypertension, diabetes mellitus, coronary artery disease, former smoker  EXAM: CHEST  2 VIEW  COMPARISON:  02/13/2015  FINDINGS: Low lung volumes.  Upper normal heart size.  Atherosclerotic calcification aorta.  Pulmonary vascular congestion.  Bibasilar atelectasis, increased.  Apices clear.  Questionable small RIGHT pleural effusion.  No pneumothorax.  Bones demineralized.  IMPRESSION: Decreased lung volumes with increased bibasilar atelectasis and questionable small RIGHT pleural effusion.   Electronically Signed   By: Ulyses Southward M.D.   On: 02/21/2015 18:17   Dg Chest 2 View  02/05/2015    CLINICAL DATA:  Larey Seat. Week and soreness all over. Tenderness in the back and right shoulder area.  EXAM: CHEST  2 VIEW  COMPARISON:  04/28/2014  FINDINGS: There are old right rib fractures. Age-indeterminate fractures involving the left fifth and sixth ribs. Densities at the left lung base could represent atelectasis or scarring. Heart size is grossly normal with atherosclerotic calcifications at the aorta. Negative for  a pneumothorax. Again noted is a compression fracture in the upper lumbar spine.  IMPRESSION: Low lung volumes bilaterally.  Bilateral rib fractures. Left rib fractures are age indeterminate and new from the prior examination.   Electronically Signed   By: Richarda Overlie M.D.   On: 02/05/2015 07:55   Dg Lumbar Spine Complete  02/05/2015   CLINICAL DATA:  Injury, fall in the bathroom  EXAM: LUMBAR SPINE - COMPLETE 4+ VIEW  COMPARISON:  01/03/2015  FINDINGS: Five views of the lumbar spine submitted. There is diffuse osteopenia. Atherosclerotic calcifications of abdominal aorta and iliac arteries. Again noted compression deformity upper endplate of L1 vertebral body is stable from prior exam. There is disc space flattening with mild anterior spurring at T12-L1 and L1-L2 level. Mild anterior spurring upper endplate of L3 and L4 vertebral body. Minimal disc space flattening at L5-S1 level. There is mild lower lumbar levoscoliosis.  IMPRESSION: No acute fracture or subluxation. Mild lower lumbar levoscoliosis. Degenerative changes as described above. Stable compression fracture upper endplate of L1 vertebral body. Diffuse osteopenia.   Electronically Signed   By: Natasha Mead M.D.   On: 02/05/2015 08:05   Dg Shoulder Right  02/05/2015   CLINICAL DATA:  Larey Seat with weakness. Tenderness in the back and right shoulder area.  EXAM: RIGHT SHOULDER - 2+ VIEW  COMPARISON:  04/28/2014  FINDINGS: There are old right rib fractures. The right shoulder is located without a fracture. Right AC joint is intact. Evidence  for spurring and degenerative changes at the right glenohumeral joint.  IMPRESSION: No acute bone abnormality in the right shoulder.   Electronically Signed   By: Richarda Overlie M.D.   On: 02/05/2015 08:00   Ct Head Wo Contrast  02/21/2015   CLINICAL DATA:  Multiple falls with 2 yesterday. Confusion. Incontinence. Decreased appetite.  EXAM: CT HEAD WITHOUT CONTRAST  TECHNIQUE: Contiguous axial images were obtained from the base of the skull through the vertex without intravenous contrast.  COMPARISON:  None  FINDINGS: Sinuses/Soft tissues: Surgical changes about the globes. Mucosal thickening of the left maxillary sinus. No skull fracture. Clear mastoid air cells.  Intracranial: Expected cerebral atrophy. Moderate low density in the periventricular white matter likely related to small vessel disease. No mass lesion, hemorrhage, hydrocephalus, acute infarct, intra-axial, or extra-axial fluid collection.  IMPRESSION: 1.  No acute intracranial abnormality. 2.  Cerebral atrophy and small vessel ischemic change. 3. Mild sinus disease.   Electronically Signed   By: Jeronimo Greaves M.D.   On: 02/21/2015 17:56   Ct Head Wo Contrast  02/05/2015   CLINICAL DATA:  Fall.  Hit head.  EXAM: CT HEAD WITHOUT CONTRAST  TECHNIQUE: Contiguous axial images were obtained from the base of the skull through the vertex without intravenous contrast.  COMPARISON:  None.  FINDINGS: There is diffuse low attenuation throughout the subcortical and periventricular white matter compatible with chronic microvascular disease. Prominence of the sulci and the ventricles noted consistent with brain atrophy. No abnormal extra-axial fluid collections, intracranial hemorrhage or mass noted. There is mild mucosal thickening involving the maxillary sinuses. The paranasal sinuses are clear. The mastoid air cells appear clear. The calvarium is intact.  IMPRESSION: 1. No acute intracranial abnormality. 2. Chronic microvascular disease and brain atrophy.    Electronically Signed   By: Signa Kell M.D.   On: 02/05/2015 08:26   Ct Chest W Contrast  02/05/2015   CLINICAL DATA:  Fall.  Right rib pain  EXAM: CT CHEST WITH CONTRAST  TECHNIQUE: Multidetector CT imaging of the chest was performed during intravenous contrast administration.  CONTRAST:  OMNIPAQUE IOHEXOL 300 MG/ML  SOLN  COMPARISON:  Chest x-ray 02/05/2015.  No prior chest CT for review  FINDINGS: Left upper lobe mass lesion measures 3.5 x 11.6 cm. This has spiculated margins and is worrisome for carcinoma. Additional spiculated mass in the superior segment left lower lobe adjacent to the major fissure measures approximately 23 x 13 mm. These lesions are difficult to see on chest x-ray due to their location. Both lesions are worrisome for carcinoma. No mediastinal adenopathy.  Chronic lung disease with probable interstitial scarring on the right.  Right lower lobe airspace disease may represent atelectasis or pneumonia. Small right effusion. Mild left lower lobe atelectasis.  Coronary artery calcification. Heart size normal. Atherosclerotic calcification aortic arch without aneurysm or dissection. No central pulmonary embolus.  Chronic right rib fractures with deformity. No acute right rib fracture. Left rib fractures appear subacute with callus formation. No acute left rib fracture.  Negative for mass in the upper abdomen.  Mild compression fracture at T11 of indeterminate age.  IMPRESSION: Spiculated mass left upper lobe and superior segment left lower lobe. These findings are concerning for carcinoma of the lung. Pneumonia and fibrosis considered less likely. PET-CT recommended for further evaluation.  Probable chronic lung disease with interstitial scarring on the right. Right lower lobe atelectasis/ infiltrate. Small right effusion. Pneumonia not excluded on the right.  Bilateral rib fractures appear subacute to chronic. No acute rib fracture.  Mild compression fracture T11, possibly acute.   These results were called by telephone at the time of interpretation on 02/05/2015 at 11:18 am to [facility] , who verbally acknowledged these results.   Electronically Signed   By: Marlan Palau M.D.   On: 02/05/2015 11:19   Ct Cervical Spine Wo Contrast  02/05/2015   CLINICAL DATA:  81 year old with recent fall. Head laceration. Complains of pain in the upper back.  EXAM: CT CERVICAL SPINE WITHOUT CONTRAST  TECHNIQUE: Multidetector CT imaging of the cervical spine was performed without intravenous contrast. Multiplanar CT image reconstructions were also generated.  COMPARISON:  01/25/2008  FINDINGS: Negative for an acute fracture dislocation in the cervical spine. There is mild sclerosis and deformity of the T3 superior endplate which appears new since 2009. There is mild retrolisthesis at C3-C4 with disc space narrowing. Bilateral facet arthropathy. No significant soft tissue edema. No significant neck lymphadenopathy.  Images of the upper lungs demonstrate a 2.7 x 1.1 cm opacity in the medial left upper lobe. This opacity contains some scattered calcifications. There are additional patchy opacities and calcifications in the right upper lung. There is also a small focal opacity in the medial superior segment of the left lower lobe measuring up to 1.5 cm.  IMPRESSION: Degenerative changes in cervical spine without acute bone abnormality.  Mild compression deformity of the T3 vertebral body of unknown age. Legrand Rams an old injury based on the sclerosis but recommend clinical correlation.  There are focal parenchymal densities or lesions in the left upper lung, particularly in the left upper lobe. These appear to be new since 2009. Findings are nonspecific but a neoplastic process cannot be excluded. Recommend further characterization with a post contrast chest CT.  These results were called by telephone at the time of interpretation on 02/05/2015 at 8:42 am to [facility], PA-C, who verbally acknowledged  these results.   Electronically Signed   By: Richarda Overlie M.D.   On:  02/05/2015 08:52   Mr Brain Wo Contrast  02/28/2015   CLINICAL DATA:  Altered mental status.  EXAM: MRI HEAD WITHOUT CONTRAST  TECHNIQUE: Multiplanar, multiecho pulse sequences of the brain and surrounding structures were obtained without intravenous contrast.  COMPARISON:  Head CT 02/21/2015  FINDINGS: A non expanded partially empty sella is incidentally noted. There is no evidence of acute infarct, intracranial hemorrhage, mass, midline shift, or extra-axial fluid collection. There is moderate generalized cerebral and cerebellar atrophy. Small, chronic cortical and subcortical infarcts are present in the posterior right frontal lobe and right parietal lobe. Subcortical and periventricular cerebral white matter T2 hyperintensities elsewhere are nonspecific but compatible with moderate chronic small vessel ischemic disease.  Prior bilateral cataract extraction is noted. Paranasal sinuses are clear. There is trace right mastoid fluid. Major intracranial vascular flow voids are preserved.  IMPRESSION: 1. No acute intracranial abnormality. 2. Moderate chronic small vessel ischemic disease and cerebral atrophy. 3. 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   Electronically Signed   By: Sebastian Ache M.D.   On: 02/28/2015 10:55   Dg Chest Port 1 View  02/24/2015   CLINICAL DATA:  Increased tracheal secretions, acute onset. Initial encounter.  EXAM: PORTABLE CHEST - 1 VIEW  COMPARISON:  Chest radiograph performed 02/21/2015  FINDINGS: The lungs remain hypoexpanded. Patchy bibasilar airspace opacities, right greater than left, raise concern for pneumonia, given the patient's symptoms. A small right pleural effusion is noted. No pneumothorax is seen.  The cardiomediastinal silhouette remains normal in size. No acute osseous abnormalities are identified.  IMPRESSION: Lungs hypoexpanded. Patchy bibasilar airspace opacities, right greater than  left, raise concern for pneumonia given the patient's symptoms. Small right pleural effusion noted.   Electronically Signed   By: Roanna Raider M.D.   On: 02/24/2015 00:01   Dg Abd Portable 1v  02/24/2015   CLINICAL DATA:  NG tube placement  EXAM: PORTABLE ABDOMEN - 1 VIEW  COMPARISON:  No recent similar comparison exam  FINDINGS: Feeding tube tip terminates over the expected location of the gastric cardia. Right greater than left basilar lung field opacification is partly visualized. Normal bowel gas pattern in visualized aspects with bowel retained oral contrast.  IMPRESSION: Feeding tube tip terminates over the expected location of the gastric cardia.   Electronically Signed   By: Christiana Pellant M.D.   On: 02/24/2015 19:44   Dg Swallowing Func-speech Pathology  02/28/2015    Objective Swallowing Evaluation:   Modified Barium Swallow Patient Details  Name: Iverson Sees MRN: [id number] Date of Birth: August 01, 1926  Today's Date: 02/28/2015 Time: SLP Start Time (ACUTE ONLY): 1100-SLP Stop Time (ACUTE ONLY): 1137 SLP Time Calculation (min) (ACUTE ONLY): 37 min  Past Medical History:  Past Medical History  Diagnosis Date  . Diabetes mellitus without complication   . Hypertension   . Hypercholesteremia   . Heart murmur   . Frequent urination   . BPH (benign prostatic hyperplasia)   . GERD (gastroesophageal reflux disease)   . Coronary artery disease     medical management with aspirin  . Glaucoma    Past Surgical History:  Past Surgical History  Procedure Laterality Date  . Leg surgery  5 years ago    Rod in right leg  . Back surgery  years ago    lower  . Hernia repair  years ago  . Cystoscopy with retrograde pyelogram, ureteroscopy and stent placement  Bilateral 12/12/2014    Procedure: CYSTO WITH BILATERAL RETROGRADE/BIOPSY WITH  FULGURATION/HYDRODISTENSION  OF BLADDER;  Surgeon: Bjorn Pippin, MD;   Location: [facility];  Service: Urology;  Laterality: Bilateral;   HPI:  Other Pertinent Information: 81 y.o. male with a past  medical history of  type 2 diabetes, hypertension, hyperlipidemia, heart murmur, BPH, GERD,  hernia repair, coronary artery disease, glaucoma who was brought in by his  son to the emergency department due to progressive weakness associated  with 4 falls in the last 9 days and confusion. CXR Patchy bibasilar  airspace opacities, right greater than left, raise concern for pneumonia  given the patient's symptoms. Small right pleural effusion noted. Head CT  negative. Found to have acute encephalopathy due to UTI. Repeat MBS for   No Data Recorded  Assessment / Plan / Recommendation CHL IP CLINICAL IMPRESSIONS 02/28/2015  Therapy Diagnosis Mild oral phase dysphagia;Moderate oral phase  dysphagia;Moderate cervical esophageal phase dysphagia;Severe cervical  esophageal phase dysphagia  Clinical Impression Pt exhibited minimally improved swallow function from  [facility] 8/29 with slight and flash penetration with puree although max  pyriform sinus and mod-max vallecular residue. Silent aspiration during  the swallow teaspoon honey thick  with therapeutic chin tuck ineffective  to prevent penetration/aspiration. Aspiration risk is high with any po's  at present, however Dys 1 (puree) texture and pudding thick liquids  recommended. Continue ST at SNF with likely initiation of honey thick  liquids with known risks as pt's family have stated no PEG and they may  want comfort feeds/liquids. Recommend sit upright, small bites, swallow  2-3 additional times after bites to clear residue and volitional throat  clear/cough after every several bites, full supervision and crush meds.        CHL IP TREATMENT RECOMMENDATION 02/28/2015  Treatment Recommendations Therapy as outlined in treatment plan below     CHL IP DIET RECOMMENDATION 02/28/2015  SLP Diet Recommendations Dysphagia 1 (Puree)  Liquid Administration via (None)  Medication Administration Crushed with puree  Compensations Slow rate;Small sips/bites;Multiple dry swallows after each   bite/sip;Hard cough after swallow;Clear throat intermittently  Postural Changes and/or Swallow Maneuvers (None)     CHL IP OTHER RECOMMENDATIONS 02/28/2015  Recommended Consults (None)  Oral Care Recommendations Oral care QID  Other Recommendations (None)     CHL IP FOLLOW UP RECOMMENDATIONS 02/26/2015  Follow up Recommendations Skilled Nursing facility     [facility] IP FREQUENCY AND DURATION 02/28/2015  Speech Therapy Frequency (ACUTE ONLY) min 2x/week  Treatment Duration 2 weeks     Pertinent Vitals/Pain none    SLP Swallow Goals No flowsheet data found.  No flowsheet data found.    CHL IP REASON FOR REFERRAL 02/28/2015  Reason for Referral Objectively evaluate swallowing function               No flowsheet data found.         Royce [place] 02/28/2015, 2:35 PM   Breck Coons Lonell Face.Ed CCC-SLP Pager [phone]    Dg Swallowing Func-speech Pathology  02/24/2015    Objective Swallowing Evaluation:   Modified Barium Swallow Patient Details  Name: Anthem Frazer MRN: [id number] Date of Birth: [birth year]  Today's Date: 02/24/2015 Time: SLP Start Time (ACUTE ONLY): 1305-SLP Stop Time (ACUTE ONLY): 1328 SLP Time Calculation (min) (ACUTE ONLY): 23 min  Past Medical History:  Past Medical History  Diagnosis Date  . Diabetes mellitus without complication   . Hypertension   . Hypercholesteremia   . Heart murmur   . Frequent urination   . BPH (benign prostatic hyperplasia)   .  GERD (gastroesophageal reflux disease)   . Coronary artery disease     medical management with aspirin  . Glaucoma    Past Surgical History:  Past Surgical History  Procedure Laterality Date  . Leg surgery  5 years ago    Rod in right leg  . Back surgery  years ago    lower  . Hernia repair  years ago  . Cystoscopy with retrograde pyelogram, ureteroscopy and stent placement  Bilateral 12/12/2014    Procedure: CYSTO WITH BILATERAL RETROGRADE/BIOPSY WITH  FULGURATION/HYDRODISTENSION OF BLADDER;  Surgeon: Bjorn Pippin, MD;   Location: [facility];  Service: Urology;   Laterality: Bilateral;   HPI:  Other Pertinent Information: 81 y.o. male with a past medical history of  type 2 diabetes, hypertension, hyperlipidemia, heart murmur, BPH, GERD,  hernia repair, coronary artery disease, glaucoma who was brought in by his  son to the emergency department due to progressive weakness associated  with 4 falls in the last 9 days and confusion. CXR Patchy bibasilar  airspace opacities, right greater than left, raise concern for pneumonia  given the patient's symptoms. Small right pleural effusion noted. Head CT  negative. Found to have acute encephalopathy due to UTI.  No Data Recorded  Assessment / Plan / Recommendation CHL IP CLINICAL IMPRESSIONS 02/24/2015  Therapy Diagnosis Mild oral phase dysphagia;Moderate pharyngeal phase  dysphagia;Severe pharyngeal phase dysphagia  Clinical Impression Delayed oral transit and cohesion across  consistencies. Moderate-severe sensorimotor based pharyngeal dysphagia and  decreased sensation with delayed swallow initiation. Laryngeal penetration  and aspiration (silent) across all consistencies assessed during initial  swallow and in attempts to clear mod-max vallecular and moderate pyriform  sinus residue. Inadequate sensation to residue and unable to safety reduce  with max cues for multiple swallows. Pt at significant aspiration risk at  present wth clinical findings and current confusion, recommend NPO,  temporary means of nutrition and oral care. ST will follow.        CHL IP TREATMENT RECOMMENDATION 02/24/2015  Treatment Recommendations Therapy as outlined in treatment plan below     CHL IP DIET RECOMMENDATION 02/24/2015  SLP Diet Recommendations NPO;Alternative means - temporary  Liquid Administration via (None)  Medication Administration Via alternative means  Compensations (None)  Postural Changes and/or Swallow Maneuvers (None)     CHL IP OTHER RECOMMENDATIONS 02/24/2015  Recommended Consults (None)  Oral Care Recommendations Oral care QID  Other  Recommendations (None)     No flowsheet data found.   CHL IP FREQUENCY AND DURATION 02/24/2015  Speech Therapy Frequency (ACUTE ONLY) min 2x/week  Treatment Duration 2 weeks     Pertinent Vitals/Pain none    SLP Swallow Goals No flowsheet data found.  No flowsheet data found.    CHL IP REASON FOR REFERRAL 02/24/2015  Reason for Referral Objectively evaluate swallowing function               No flowsheet data found.         Royce Macadamia 02/24/2015, 2:13 PM   Breck Coons Lonell Face.Ed CCC-SLP Pager [phone]      Dg Hip Unilat  With Pelvis 2-3 Views Right  02/21/2015   CLINICAL DATA:  Multiple falls over the past 10 days. Right hip pain and bruising. History of prior right intertrochanteric fracture. Initial encounter.  EXAM: DG HIP (WITH OR WITHOUT PELVIS) 2-3V RIGHT  COMPARISON:  Single view of the abdomen 12/26/2014.  FINDINGS: A hip screw and long IM nail are in place for fixation of  a healed right intertrochanteric fracture. No acute fracture is identified. No notable degenerative change is identified. No focal bony lesion is seen.  IMPRESSION: No acute abnormality. Healed right intertrochanteric fracture with fixation hardware in place.   Electronically Signed   By: Drusilla Kanner M.D.   On: 02/21/2015 15:45    Microbiology: Recent Results (from the past 240 hour(s))  Urine culture     Status: None   Collection Time: 02/21/15  5:10 PM  Result Value Ref Range Status   Specimen Description URINE, CLEAN CATCH  Final   Special Requests Immunocompromised  Final   Culture >=100,000 COLONIES/mL ENTEROCOCCUS SPECIES  Final   Report Status 02/24/2015 FINAL  Final   Organism ID, Bacteria ENTEROCOCCUS SPECIES  Final      Susceptibility   Enterococcus species - MIC*    AMPICILLIN <=2 RESISTANT Resistant     LEVOFLOXACIN >=8 RESISTANT Resistant     NITROFURANTOIN <=16 SENSITIVE Sensitive     VANCOMYCIN 1 SENSITIVE Sensitive     * >=100,000 COLONIES/mL ENTEROCOCCUS SPECIES     Labs: Basic  Metabolic Panel:  Recent Labs Lab 02/23/15 0600 02/25/15 0720 02/26/15 0632 02/27/15 0653 02/28/15 0604  NA 132* 130* 132* 133* 135  K 4.1 3.9 3.5 3.4* 3.6  CL 95* 97* 97* 96* 97*  CO2 29 24 26 29 31   GLUCOSE 137* 121* 203* 234* 229*  BUN 8 10 13 13 14   CREATININE 0.87 0.86 0.93 0.86 0.84  CALCIUM 9.3 8.8* 8.5* 8.5* 8.9  MG  --   --   --  2.0  --    Liver Function Tests:  Recent Labs Lab 02/28/15 0604  AST 15  ALT 25  ALKPHOS 83  BILITOT 0.4  PROT 5.9*  ALBUMIN 2.7*   No results for input(s): LIPASE, AMYLASE in the last 168 hours. No results for input(s): AMMONIA in the last 168 hours. CBC:  Recent Labs Lab 02/23/15 0600 02/26/15 0632 02/28/15 0604  WBC 8.4 9.7 8.1  HGB 11.6* 11.8* 11.9*  HCT 33.9* 35.1* 36.6*  MCV 90.4 90.0 91.5  PLT 334 316 317   Cardiac Enzymes: No results for input(s): CKTOTAL, CKMB, CKMBINDEX, TROPONINI in the last 168 hours. BNP: BNP (last 3 results) No results for input(s): BNP in the last 8760 hours.  ProBNP (last 3 results) No results for input(s): PROBNP in the last 8760 hours.  CBG:  Recent Labs Lab 02/28/15 0814 02/28/15 1217 02/28/15 1656 02/28/15 2200 03/01/15 0751  GLUCAP 221* 207* 154* 184* 118*       Signed:  Lavarius Doughten MD, PhD  [facility] 03/01/2015, 11:14 AM

## 2015-03-01 NOTE — Progress Notes (Signed)
[facility]                                                                              Patient Demographics  Kurt Navarro, is a 81 y.o. male, DOB - 22-Sep-1926, ZOX:[id number]  Admit date - 02/21/2015   Admitting Physician Bobette Mo, MD  Outpatient Primary MD for the patient is Thayer Headings, MD  LOS - 8   Chief Complaint  Patient presents with  . Fall      HPI 02/21/2015 by Dr. Sanda Klein Kurt Navarro is a 81 y.o. male with a past medical history of type 2 diabetes, hypertension, hyperlipidemia, heart murmur, BPH, GERD, coronary artery disease, glaucoma who was brought in by his son to the emergency department due to progressive weakness associated with 4 falls in the last 9 days and confusion. Apparently the patient has not hit his head, he hasn't have fever or chills, but he is usually alert oriented 3 and has been unable to tell the current date even down to the year, as well as being confused with other events for the past week plus.He is he is oriented to place and is currently in no acute distress. He is unable to provide further details on his history.   Assessment & Plan   Acute encephalopathy secondary UTI/Pneumonia -CT head: No acute intracranial abnormality -CXR 8/26: Decreased lung volumes with increased bibasilar atelectasis, questionable small right pleural effusion, CXR 8/28: Has a bi-basilar airspace opacities, concerning for pneumonia, pneumonia likely secondary to aspiration. Strep pneumonia urine antigen negative, legionella urine antigen negative -UA: WBC TNTC, Large leukocytes , Urine culture >100K Enterococcus, finished treatment with IV vancomycin --finished treatment with zosyn for possible aspiration pna., patient and family aware of and accept the risk of recurrent aspiration pneumonia, patient want to eat, they has not decided whether to treat with abx if recurrent aspiration pneumonia.  -confusion resolved, no oxygen requirement   9/3  family now is interested in hospice care, palliative team input appreicated  Dysphagia ---has been npo since admission, has been on tube feeds through [facility] tube, nutrition and speech input appreciated. -family and patient hanges his minds at times about peg tube placement initially, IR was consulted for peg tube placement initially, later family and patient decided no feeding tube. -family coordinated with speechtherapy for repeat swallow eval on 9/2,  -9/2 repeated swallow eval, showed risk of aspiration , family and patient are willing to take the risk of aspiration, now on puree diet and putting thick liquid, ng removed, appreciate palliative team intput, patient now DNR/DNI, no feeding tube, they agree with SNF placement and continue outpatient palliative care consult, patient and family has not decided whether to treat with abx if recurrent aspiration pneumonia. -9/3 family now thinking about hospice care    Fall/physical deconditioning -family reported progressive weakness and multiple falls in the last month PT consulted and appreciated, recommended SNF -social work consulted for placement -9/3 SNF discharge cancelled, family want to discuss hospice care  Diabetes mellitus, type 2 -Metformin held -Continue insulin sliding scale CBG monitoring -a1c 6.4  Hyperlipidemia -Continue statin  Essential hypertension -Continue Cozaar  BPH -Continue Proscar, Flomax  Hypothyroidism -Continue Synthroid  Code  Status: DNR/DNI  Family Communication:  Patient, son and wife in room  Disposition Plan:   SNF discharge cancelled, now considering hospice  Time Spent in minutes   35 minutes  Procedures  Modified Barium swallow  Consults   Palliative care  DVT Prophylaxis  Lovenox  Lab Results  Component Value Date   PLT 317 02/28/2015    Medications  Scheduled Meds: . enoxaparin (LOVENOX) injection  40 mg Subcutaneous Q24H  . ferrous sulfate  300 mg Per Tube BID WC  .  insulin aspart  0-5 Units Subcutaneous QHS  . insulin aspart  0-9 Units Subcutaneous TID WC  . insulin glargine  10 Units Subcutaneous QHS  . lactobacillus  1 g Oral TID WC  . latanoprost  1 drop Navarro Eyes QHS  . levothyroxine  50 mcg Per Tube QAC breakfast  . losartan  100 mg Per Tube Daily  . multivitamin with minerals  1 tablet Per Tube Daily  . pantoprazole sodium  40 mg Per Tube Daily  . pravastatin  20 mg Per Tube QPM   Continuous Infusions: . feeding supplement (JEVITY 1.2 CAL) Stopped (02/28/15 0940)   PRN Meds:.  Antibiotics    Anti-infectives    Start     Dose/Rate Route Frequency Ordered Stop   03/01/15 0000  amoxicillin-clavulanate (AUGMENTIN) 875-125 MG per tablet     1 tablet Oral 2 times daily 03/01/15 1107     02/24/15 0600  piperacillin-tazobactam (ZOSYN) IVPB 3.375 g  Status:  Discontinued     3.375 g 12.5 mL/hr over 240 Minutes Intravenous Every 8 hours 02/24/15 0027 02/28/15 1223   02/24/15 0030  piperacillin-tazobactam (ZOSYN) IVPB 3.375 g     3.375 g 100 mL/hr over 30 Minutes Intravenous  Once 02/24/15 0027 02/24/15 0130   02/23/15 1700  vancomycin (VANCOCIN) 1,250 mg in sodium chloride 0.9 % 250 mL IVPB  Status:  Discontinued     1,250 mg 166.7 mL/hr over 90 Minutes Intravenous Every 24 hours 02/23/15 1556 02/27/15 1103   02/22/15 1630  cefTRIAXone (ROCEPHIN) 1 g in dextrose 5 % 50 mL IVPB  Status:  Discontinued     1 g 100 mL/hr over 30 Minutes Intravenous Every 24 hours 02/21/15 2054 02/23/15 1545   02/21/15 1630  cefTRIAXone (ROCEPHIN) 1 g in dextrose 5 % 50 mL IVPB     1 g 100 mL/hr over 30 Minutes Intravenous  Once 02/21/15 1626 02/21/15 1822      Subjective:   Kurt Navarro seen and examined today.  Feeling better, NG removed, happy that he can eat, sitting in chair , wife in room  Objective:   Filed Vitals:   02/28/15 1345 02/28/15 1952 02/28/15 2157 03/01/15 0557  BP: 143/70  175/78 169/88  Pulse: 71  87 99  Temp: 98.8 F (37.1 C)  99.3  F (37.4 C) 99.6 F (37.6 C)  TempSrc: Oral  Oral Oral  Resp: 20  20 20   Height:      Weight:  132 lb 4.4 oz (60 kg)  132 lb 7.9 oz (60.1 kg)  SpO2: 94%  97% 93%    Wt Readings from Last 3 Encounters:  03/01/15 132 lb 7.9 oz (60.1 kg)  12/12/14 146 lb (66.225 kg)  12/11/14 146 lb (66.225 kg)     Intake/Output Summary (Last 24 hours) at 03/01/15 1235 Last data filed at 03/01/15 0700  Gross per 24 hour  Intake    340 ml  Output   1300 ml  Net   -960 ml    Exam  General: frail, elderly, chronically ill-appearing, NAD  HEENT: NCAT, mucous membranes moist.   Cardiovascular: S1 S2 auscultated, RRR  Respiratory: Diminished breath sounds, Course at bases.  Abdomen: Soft, nontender, nondistended, + bowel sounds  Extremities: warm dry without cyanosis clubbing or edema  Neuro: AAOx3,  Nonfocal. Able to lift legs against gravity, upper extremity strength 4/5.  Skin: bruising and ecchymosis noted on the extremities.  Data Review   Micro Results Recent Results (from the past 240 hour(s))  Urine culture     Status: None   Collection Time: 02/21/15  5:10 PM  Result Value Ref Range Status   Specimen Description URINE, CLEAN CATCH  Final   Special Requests Immunocompromised  Final   Culture >=100,000 COLONIES/mL ENTEROCOCCUS SPECIES  Final   Report Status 02/24/2015 FINAL  Final   Organism ID, Bacteria ENTEROCOCCUS SPECIES  Final      Susceptibility   Enterococcus species - MIC*    AMPICILLIN <=2 RESISTANT Resistant     LEVOFLOXACIN >=8 RESISTANT Resistant     NITROFURANTOIN <=16 SENSITIVE Sensitive     VANCOMYCIN 1 SENSITIVE Sensitive     * >=100,000 COLONIES/mL ENTEROCOCCUS SPECIES    Radiology Reports Dg Chest 2 View  02/21/2015   CLINICAL DATA:  Multiple falls yesterday, increased confusion, incontinence, decreased appetite, history hypertension, diabetes mellitus, coronary artery disease, former smoker  EXAM: CHEST  2 VIEW  COMPARISON:  02/13/2015  FINDINGS: Low  lung volumes.  Upper normal heart size.  Atherosclerotic calcification aorta.  Pulmonary vascular congestion.  Bibasilar atelectasis, increased.  Apices clear.  Questionable small RIGHT pleural effusion.  No pneumothorax.  Bones demineralized.  IMPRESSION: Decreased lung volumes with increased bibasilar atelectasis and questionable small RIGHT pleural effusion.   Electronically Signed   By: Ulyses Southward M.D.   On: 02/21/2015 18:17   Dg Chest 2 View  02/05/2015   CLINICAL DATA:  Larey Seat. Week and soreness all over. Tenderness in the back and right shoulder area.  EXAM: CHEST  2 VIEW  COMPARISON:  04/28/2014  FINDINGS: There are old right rib fractures. Age-indeterminate fractures involving the left fifth and sixth ribs. Densities at the left lung base could represent atelectasis or scarring. Heart size is grossly normal with atherosclerotic calcifications at the aorta. Negative for a pneumothorax. Again noted is a compression fracture in the upper lumbar spine.  IMPRESSION: Low lung volumes bilaterally.  Bilateral rib fractures. Left rib fractures are age indeterminate and new from the prior examination.   Electronically Signed   By: Richarda Overlie M.D.   On: 02/05/2015 07:55   Dg Lumbar Spine Complete  02/05/2015   CLINICAL DATA:  Injury, fall in the bathroom  EXAM: LUMBAR SPINE - COMPLETE 4+ VIEW  COMPARISON:  01/03/2015  FINDINGS: Five views of the lumbar spine submitted. There is diffuse osteopenia. Atherosclerotic calcifications of abdominal aorta and iliac arteries. Again noted compression deformity upper endplate of L1 vertebral body is stable from prior exam. There is disc space flattening with mild anterior spurring at T12-L1 and L1-L2 level. Mild anterior spurring upper endplate of L3 and L4 vertebral body. Minimal disc space flattening at L5-S1 level. There is mild lower lumbar levoscoliosis.  IMPRESSION: No acute fracture or subluxation. Mild lower lumbar levoscoliosis. Degenerative changes as described  above. Stable compression fracture upper endplate of L1 vertebral body. Diffuse osteopenia.   Electronically Signed   By: Natasha Mead M.D.   On: 02/05/2015 08:05  Dg Shoulder Right  02/05/2015   CLINICAL DATA:  Larey Seat with weakness. Tenderness in the back and right shoulder area.  EXAM: RIGHT SHOULDER - 2+ VIEW  COMPARISON:  04/28/2014  FINDINGS: There are old right rib fractures. The right shoulder is located without a fracture. Right AC joint is intact. Evidence for spurring and degenerative changes at the right glenohumeral joint.  IMPRESSION: No acute bone abnormality in the right shoulder.   Electronically Signed   By: Richarda Overlie M.D.   On: 02/05/2015 08:00   Ct Head Wo Contrast  02/21/2015   CLINICAL DATA:  Multiple falls with 2 yesterday. Confusion. Incontinence. Decreased appetite.  EXAM: CT HEAD WITHOUT CONTRAST  TECHNIQUE: Contiguous axial images were obtained from the base of the skull through the vertex without intravenous contrast.  COMPARISON:  None  FINDINGS: Sinuses/Soft tissues: Surgical changes about the globes. Mucosal thickening of the left maxillary sinus. No skull fracture. Clear mastoid air cells.  Intracranial: Expected cerebral atrophy. Moderate low density in the periventricular white matter likely related to small vessel disease. No mass lesion, hemorrhage, hydrocephalus, acute infarct, intra-axial, or extra-axial fluid collection.  IMPRESSION: 1.  No acute intracranial abnormality. 2.  Cerebral atrophy and small vessel ischemic change. 3. Mild sinus disease.   Electronically Signed   By: Jeronimo Greaves M.D.   On: 02/21/2015 17:56   Ct Head Wo Contrast  02/05/2015   CLINICAL DATA:  Fall.  Hit head.  EXAM: CT HEAD WITHOUT CONTRAST  TECHNIQUE: Contiguous axial images were obtained from the base of the skull through the vertex without intravenous contrast.  COMPARISON:  None.  FINDINGS: There is diffuse low attenuation throughout the subcortical and periventricular white matter  compatible with chronic microvascular disease. Prominence of the sulci and the ventricles noted consistent with brain atrophy. No abnormal extra-axial fluid collections, intracranial hemorrhage or mass noted. There is mild mucosal thickening involving the maxillary sinuses. The paranasal sinuses are clear. The mastoid air cells appear clear. The calvarium is intact.  IMPRESSION: 1. No acute intracranial abnormality. 2. Chronic microvascular disease and brain atrophy.   Electronically Signed   By: Signa Kell M.D.   On: 02/05/2015 08:26   Ct Chest W Contrast  02/05/2015   CLINICAL DATA:  Fall.  Right rib pain  EXAM: CT CHEST WITH CONTRAST  TECHNIQUE: Multidetector CT imaging of the chest was performed during intravenous contrast administration.  CONTRAST:  OMNIPAQUE IOHEXOL 300 MG/ML  SOLN  COMPARISON:  Chest x-ray 02/05/2015.  No prior chest CT for review  FINDINGS: Left upper lobe mass lesion measures 3.5 x 11.6 cm. This has spiculated margins and is worrisome for carcinoma. Additional spiculated mass in the superior segment left lower lobe adjacent to the major fissure measures approximately 23 x 13 mm. These lesions are difficult to see on chest x-ray due to their location. Navarro lesions are worrisome for carcinoma. No mediastinal adenopathy.  Chronic lung disease with probable interstitial scarring on the right.  Right lower lobe airspace disease may represent atelectasis or pneumonia. Small right effusion. Mild left lower lobe atelectasis.  Coronary artery calcification. Heart size normal. Atherosclerotic calcification aortic arch without aneurysm or dissection. No central pulmonary embolus.  Chronic right rib fractures with deformity. No acute right rib fracture. Left rib fractures appear subacute with callus formation. No acute left rib fracture.  Negative for mass in the upper abdomen.  Mild compression fracture at T11 of indeterminate age.  IMPRESSION: Spiculated mass left upper lobe and superior  segment left lower lobe. These findings are concerning for carcinoma of the lung. Pneumonia and fibrosis considered less likely. PET-CT recommended for further evaluation.  Probable chronic lung disease with interstitial scarring on the right. Right lower lobe atelectasis/ infiltrate. Small right effusion. Pneumonia not excluded on the right.  Bilateral rib fractures appear subacute to chronic. No acute rib fracture.  Mild compression fracture T11, possibly acute.  These results were called by telephone at the time of interpretation on 02/05/2015 at 11:18 am to [facility] , who verbally acknowledged these results.   Electronically Signed   By: Marlan Palau M.D.   On: 02/05/2015 11:19   Ct Cervical Spine Wo Contrast  02/05/2015   CLINICAL DATA:  81 year old with recent fall. Head laceration. Complains of pain in the upper back.  EXAM: CT CERVICAL SPINE WITHOUT CONTRAST  TECHNIQUE: Multidetector CT imaging of the cervical spine was performed without intravenous contrast. Multiplanar CT image reconstructions were also generated.  COMPARISON:  01/25/2008  FINDINGS: Negative for an acute fracture dislocation in the cervical spine. There is mild sclerosis and deformity of the T3 superior endplate which appears new since 2009. There is mild retrolisthesis at C3-C4 with disc space narrowing. Bilateral facet arthropathy. No significant soft tissue edema. No significant neck lymphadenopathy.  Images of the upper lungs demonstrate a 2.7 x 1.1 cm opacity in the medial left upper lobe. This opacity contains some scattered calcifications. There are additional patchy opacities and calcifications in the right upper lung. There is also a small focal opacity in the medial superior segment of the left lower lobe measuring up to 1.5 cm.  IMPRESSION: Degenerative changes in cervical spine without acute bone abnormality.  Mild compression deformity of the T3 vertebral body of unknown age. Legrand Rams an old injury based on the  sclerosis but recommend clinical correlation.  There are focal parenchymal densities or lesions in the left upper lung, particularly in the left upper lobe. These appear to be new since 2009. Findings are nonspecific but a neoplastic process cannot be excluded. Recommend further characterization with a post contrast chest CT.  These results were called by telephone at the time of interpretation on 02/05/2015 at 8:42 am to [facility], PA-C, who verbally acknowledged these results.   Electronically Signed   By: Richarda Overlie M.D.   On: 02/05/2015 08:52   Mr Brain Wo Contrast  02/28/2015   CLINICAL DATA:  Altered mental status.  EXAM: MRI HEAD WITHOUT CONTRAST  TECHNIQUE: Multiplanar, multiecho pulse sequences of the brain and surrounding structures were obtained without intravenous contrast.  COMPARISON:  Head CT 02/21/2015  FINDINGS: A non expanded partially empty sella is incidentally noted. There is no evidence of acute infarct, intracranial hemorrhage, mass, midline shift, or extra-axial fluid collection. There is moderate generalized cerebral and cerebellar atrophy. Small, chronic cortical and subcortical infarcts are present in the posterior right frontal lobe and right parietal lobe. Subcortical and periventricular cerebral white matter T2 hyperintensities elsewhere are nonspecific but compatible with moderate chronic small vessel ischemic disease.  Prior bilateral cataract extraction is noted. Paranasal sinuses are clear. There is trace right mastoid fluid. Major intracranial vascular flow voids are preserved.  IMPRESSION: 1. No acute intracranial abnormality. 2. Moderate chronic small vessel ischemic disease and cerebral atrophy. 3. 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   Electronically Signed   By: Sebastian Ache M.D.   On: 02/28/2015 10:55   Dg Chest Port 1 View  02/24/2015   CLINICAL DATA:  Increased  tracheal secretions, acute onset. Initial encounter.  EXAM: PORTABLE CHEST - 1 VIEW   COMPARISON:  Chest radiograph performed 02/21/2015  FINDINGS: The lungs remain hypoexpanded. Patchy bibasilar airspace opacities, right greater than left, raise concern for pneumonia, given the patient's symptoms. A small right pleural effusion is noted. No pneumothorax is seen.  The cardiomediastinal silhouette remains normal in size. No acute osseous abnormalities are identified.  IMPRESSION: Lungs hypoexpanded. Patchy bibasilar airspace opacities, right greater than left, raise concern for pneumonia given the patient's symptoms. Small right pleural effusion noted.   Electronically Signed   By: Roanna Raider M.D.   On: 02/24/2015 00:01   Dg Abd Portable 1v  02/24/2015   CLINICAL DATA:  NG tube placement  EXAM: PORTABLE ABDOMEN - 1 VIEW  COMPARISON:  No recent similar comparison exam  FINDINGS: Feeding tube tip terminates over the expected location of the gastric cardia. Right greater than left basilar lung field opacification is partly visualized. Normal bowel gas pattern in visualized aspects with bowel retained oral contrast.  IMPRESSION: Feeding tube tip terminates over the expected location of the gastric cardia.   Electronically Signed   By: Christiana Pellant M.D.   On: 02/24/2015 19:44   Dg Swallowing Func-speech Pathology  02/28/2015    Objective Swallowing Evaluation:   Modified Barium Swallow Patient Details  Name: Kurt Navarro MRN: [id number] Date of Birth: 1926-07-30  Today's Date: 02/28/2015 Time: SLP Start Time (ACUTE ONLY): 1100-SLP Stop Time (ACUTE ONLY): 1137 SLP Time Calculation (min) (ACUTE ONLY): 37 min  Past Medical History:  Past Medical History  Diagnosis Date  . Diabetes mellitus without complication   . Hypertension   . Hypercholesteremia   . Heart murmur   . Frequent urination   . BPH (benign prostatic hyperplasia)   . GERD (gastroesophageal reflux disease)   . Coronary artery disease     medical management with aspirin  . Glaucoma    Past Surgical History:  Past Surgical History   Procedure Laterality Date  . Leg surgery  5 years ago    Rod in right leg  . Back surgery  years ago    lower  . Hernia repair  years ago  . Cystoscopy with retrograde pyelogram, ureteroscopy and stent placement  Bilateral 12/12/2014    Procedure: CYSTO WITH BILATERAL RETROGRADE/BIOPSY WITH  FULGURATION/HYDRODISTENSION OF BLADDER;  Surgeon: Bjorn Pippin, MD;   Location: [facility];  Service: Urology;  Laterality: Bilateral;   HPI:  Other Pertinent Information: 81 y.o. male with a past medical history of  type 2 diabetes, hypertension, hyperlipidemia, heart murmur, BPH, GERD,  hernia repair, coronary artery disease, glaucoma who was brought in by his  son to the emergency department due to progressive weakness associated  with 4 falls in the last 9 days and confusion. CXR Patchy bibasilar  airspace opacities, right greater than left, raise concern for pneumonia  given the patient's symptoms. Small right pleural effusion noted. Head CT  negative. Found to have acute encephalopathy due to UTI. Repeat MBS for   No Data Recorded  Assessment / Plan / Recommendation CHL IP CLINICAL IMPRESSIONS 02/28/2015  Therapy Diagnosis Mild oral phase dysphagia;Moderate oral phase  dysphagia;Moderate cervical esophageal phase dysphagia;Severe cervical  esophageal phase dysphagia  Clinical Impression Pt exhibited minimally improved swallow function from  [facility] 8/29 with slight and flash penetration with puree although max  pyriform sinus and mod-max vallecular residue. Silent aspiration during  the swallow teaspoon honey thick  with therapeutic chin tuck ineffective  to  prevent penetration/aspiration. Aspiration risk is high with any po's  at present, however Dys 1 (puree) texture and pudding thick liquids  recommended. Continue ST at SNF with likely initiation of honey thick  liquids with known risks as pt's family have stated no PEG and they may  want comfort feeds/liquids. Recommend sit upright, small bites, swallow  2-3 additional times after  bites to clear residue and volitional throat  clear/cough after every several bites, full supervision and crush meds.        CHL IP TREATMENT RECOMMENDATION 02/28/2015  Treatment Recommendations Therapy as outlined in treatment plan below     CHL IP DIET RECOMMENDATION 02/28/2015  SLP Diet Recommendations Dysphagia 1 (Puree)  Liquid Administration via (None)  Medication Administration Crushed with puree  Compensations Slow rate;Small sips/bites;Multiple dry swallows after each  bite/sip;Hard cough after swallow;Clear throat intermittently  Postural Changes and/or Swallow Maneuvers (None)     CHL IP OTHER RECOMMENDATIONS 02/28/2015  Recommended Consults (None)  Oral Care Recommendations Oral care QID  Other Recommendations (None)     CHL IP FOLLOW UP RECOMMENDATIONS 02/26/2015  Follow up Recommendations Skilled Nursing facility     [facility] IP FREQUENCY AND DURATION 02/28/2015  Speech Therapy Frequency (ACUTE ONLY) min 2x/week  Treatment Duration 2 weeks     Pertinent Vitals/Pain none    SLP Swallow Goals No flowsheet data found.  No flowsheet data found.    CHL IP REASON FOR REFERRAL 02/28/2015  Reason for Referral Objectively evaluate swallowing function               No flowsheet data found.         Royce Macadamia 02/28/2015, 2:35 PM   Breck Coons Lonell Face.Ed CCC-SLP Pager [phone]    Dg Swallowing Func-speech Pathology  02/24/2015    Objective Swallowing Evaluation:   Modified Barium Swallow Patient Details  Name: Kurt Navarro MRN: [id number] Date of Birth: 1926-12-19  Today's Date: 02/24/2015 Time: SLP Start Time (ACUTE ONLY): 1305-SLP Stop Time (ACUTE ONLY): 1328 SLP Time Calculation (min) (ACUTE ONLY): 23 min  Past Medical History:  Past Medical History  Diagnosis Date  . Diabetes mellitus without complication   . Hypertension   . Hypercholesteremia   . Heart murmur   . Frequent urination   . BPH (benign prostatic hyperplasia)   . GERD (gastroesophageal reflux disease)   . Coronary artery disease     medical management  with aspirin  . Glaucoma    Past Surgical History:  Past Surgical History  Procedure Laterality Date  . Leg surgery  5 years ago    Rod in right leg  . Back surgery  years ago    lower  . Hernia repair  years ago  . Cystoscopy with retrograde pyelogram, ureteroscopy and stent placement  Bilateral 12/12/2014    Procedure: CYSTO WITH BILATERAL RETROGRADE/BIOPSY WITH  FULGURATION/HYDRODISTENSION OF BLADDER;  Surgeon: Bjorn Pippin, MD;   Location: [facility];  Service: Urology;  Laterality: Bilateral;   HPI:  Other Pertinent Information: 81 y.o. male with a past medical history of  type 2 diabetes, hypertension, hyperlipidemia, heart murmur, BPH, GERD,  hernia repair, coronary artery disease, glaucoma who was brought in by his  son to the emergency department due to progressive weakness associated  with 4 falls in the last 9 days and confusion. CXR Patchy bibasilar  airspace opacities, right greater than left, raise concern for pneumonia  given the patient's symptoms. Small right pleural effusion noted. Head CT  negative. Found  to have acute encephalopathy due to UTI.  No Data Recorded  Assessment / Plan / Recommendation [facility] IP CLINICAL IMPRESSIONS 02/24/2015  Therapy Diagnosis Mild oral phase dysphagia;Moderate pharyngeal phase  dysphagia;Severe pharyngeal phase dysphagia  Clinical Impression Delayed oral transit and cohesion across  consistencies. Moderate-severe sensorimotor based pharyngeal dysphagia and  decreased sensation with delayed swallow initiation. Laryngeal penetration  and aspiration (silent) across all consistencies assessed during initial  swallow and in attempts to clear mod-max vallecular and moderate pyriform  sinus residue. Inadequate sensation to residue and unable to safety reduce  with max cues for multiple swallows. Pt at significant aspiration risk at  present wth clinical findings and current confusion, recommend NPO,  temporary means of nutrition and oral care. ST will follow.        [facility] IP TREATMENT  RECOMMENDATION 02/24/2015  Treatment Recommendations Therapy as outlined in treatment plan below     [facility] IP DIET RECOMMENDATION 02/24/2015  SLP Diet Recommendations NPO;Alternative means - temporary  Liquid Administration via (None)  Medication Administration Via alternative means  Compensations (None)  Postural Changes and/or Swallow Maneuvers (None)     [facility] IP OTHER RECOMMENDATIONS 02/24/2015  Recommended Consults (None)  Oral Care Recommendations Oral care QID  Other Recommendations (None)     No flowsheet data found.   [facility] IP FREQUENCY AND DURATION 02/24/2015  Speech Therapy Frequency (ACUTE ONLY) min 2x/week  Treatment Duration 2 weeks     Pertinent Vitals/Pain none    SLP Swallow Goals No flowsheet data found.  No flowsheet data found.    [facility] IP REASON FOR REFERRAL 02/24/2015  Reason for Referral Objectively evaluate swallowing function               No flowsheet data found.         Royce Macadamia 02/24/2015, 2:13 PM   Breck Coons Lonell Face.Ed CCC-SLP Pager [phone]      Dg Hip Unilat  With Pelvis 2-3 Views Right  02/21/2015   CLINICAL DATA:  Multiple falls over the past 10 days. Right hip pain and bruising. History of prior right intertrochanteric fracture. Initial encounter.  EXAM: DG HIP (WITH OR WITHOUT PELVIS) 2-3V RIGHT  COMPARISON:  Single view of the abdomen 12/26/2014.  FINDINGS: A hip screw and long IM nail are in place for fixation of a healed right intertrochanteric fracture. No acute fracture is identified. No notable degenerative change is identified. No focal bony lesion is seen.  IMPRESSION: No acute abnormality. Healed right intertrochanteric fracture with fixation hardware in place.   Electronically Signed   By: Drusilla Kanner M.D.   On: 02/21/2015 15:45    CBC  Recent Labs Lab 02/23/15 0600 02/26/15 0632 02/28/15 0604  WBC 8.4 9.7 8.1  HGB 11.6* 11.8* 11.9*  HCT 33.9* 35.1* 36.6*  PLT 334 316 317  MCV 90.4 90.0 91.5  MCH 30.9 30.3 29.8  MCHC 34.2 33.6 32.5  RDW 14.5  14.6 14.4    Chemistries   Recent Labs Lab 02/23/15 0600 02/25/15 0720 02/26/15 0632 02/27/15 0653 02/28/15 0604  NA 132* 130* 132* 133* 135  K 4.1 3.9 3.5 3.4* 3.6  CL 95* 97* 97* 96* 97*  CO2 29 24 26 29 31   GLUCOSE 137* 121* 203* 234* 229*  BUN 8 10 13 13 14   CREATININE 0.87 0.86 0.93 0.86 0.84  CALCIUM 9.3 8.8* 8.5* 8.5* 8.9  MG  --   --   --  2.0  --   AST  --   --   --   --  15  ALT  --   --   --   --  25  ALKPHOS  --   --   --   --  83  BILITOT  --   --   --   --  0.4   ------------------------------------------------------------------------------------------------------------------ estimated creatinine clearance is 53.7 mL/min (by C-G formula based on Cr of 0.84). ------------------------------------------------------------------------------------------------------------------  Recent Labs  02/28/15 0604  HGBA1C 6.4*   ------------------------------------------------------------------------------------------------------------------ No results for input(s): CHOL, HDL, LDLCALC, TRIG, CHOLHDL, LDLDIRECT in the last 72 hours. ------------------------------------------------------------------------------------------------------------------ No results for input(s): TSH, T4TOTAL, T3FREE, THYROIDAB in the last 72 hours.  Invalid input(s): FREET3 ------------------------------------------------------------------------------------------------------------------ No results for input(s): VITAMINB12, FOLATE, FERRITIN, TIBC, IRON, RETICCTPCT in the last 72 hours.  Coagulation profile  Recent Labs Lab 02/28/15 0604  INR 1.06    No results for input(s): DDIMER in the last 72 hours.  Cardiac Enzymes No results for input(s): CKMB, TROPONINI, MYOGLOBIN in the last 168 hours.  Invalid input(s): CK ------------------------------------------------------------------------------------------------------------------ Invalid input(s): POCBNP    Shakenna Herrero MD PhD. on 03/01/2015  at 12:35 PM  Between 7am to 7pm - Pager - [phone]  After 7pm go to [email or web address] - password [email or web address]  And look for the night coverage person covering for me after hours  [facility] Office  [phone]

## 2015-03-02 LAB — GLUCOSE, CAPILLARY
GLUCOSE-CAPILLARY: 190 mg/dL — AB (ref 65–99)
GLUCOSE-CAPILLARY: 143 mg/dL — AB (ref 65–99)
Glucose-Capillary: 179 mg/dL — ABNORMAL HIGH (ref 65–99)
Glucose-Capillary: 144 mg/dL — ABNORMAL HIGH (ref 65–99)

## 2015-03-02 MED ORDER — GLYCOPYRROLATE 0.2 MG/ML IJ SOLN
0.1000 mg | Freq: Three times a day (TID) | INTRAMUSCULAR | Status: DC | PRN
  Administered 2015-03-03 – 2015-03-04 (×2): 0.1 mg via INTRAVENOUS
  Filled 2015-03-02 (×4): qty 0.5

## 2015-03-02 MED ORDER — MORPHINE SULFATE (CONCENTRATE) 10 MG/0.5ML PO SOLN
5.0000 mg | ORAL | Status: DC | PRN
  Administered 2015-03-02 – 2015-03-03 (×3): 5 mg via ORAL
  Filled 2015-03-02 (×3): qty 0.5

## 2015-03-02 MED ORDER — LORAZEPAM 0.5 MG PO TABS: 0.5000 mg | ORAL_TABLET | ORAL | Status: DC | PRN

## 2015-03-02 NOTE — Progress Notes (Signed)
[facility]                                                                              Patient Demographics  Kurt Navarro, is a 81 y.o. male, DOB - March 07, 1927, ZOX:[id number]  Admit date - 02/21/2015   Admitting Physician Bobette Mo, MD  Outpatient Primary MD for the patient is Thayer Headings, MD  LOS - 9   Chief Complaint  Patient presents with  . Fall      HPI 02/21/2015 by Dr. Sanda Klein Kurt Navarro is a 81 y.o. male with a past medical history of type 2 diabetes, hypertension, hyperlipidemia, heart murmur, BPH, GERD, coronary artery disease, glaucoma who was brought in by his son to the emergency department due to progressive weakness associated with 4 falls in the last 9 days and confusion. Apparently the patient has not hit his head, he hasn't have fever or chills, but he is usually alert oriented 3 and has been unable to tell the current date even down to the year, as well as being confused with other events for the past week plus.He is he is oriented to place and is currently in no acute distress. He is unable to provide further details on his history.   Assessment & Plan   Acute encephalopathy secondary UTI/Pneumonia -CT head: No acute intracranial abnormality -CXR 8/26: Decreased lung volumes with increased bibasilar atelectasis, questionable small right pleural effusion, CXR 8/28: Has a bi-basilar airspace opacities, concerning for pneumonia, pneumonia likely secondary to aspiration. Aspiration pna likely a chronic condition, presented prior to admission. Strep pneumonia urine antigen negative, legionella urine antigen negative -UA: WBC TNTC, Large leukocytes , Urine culture >100K Enterococcus, finished treatment with IV vancomycin --finished treatment with zosyn for possible aspiration pna., patient and family aware of and accept the risk of recurrent aspiration pneumonia, patient wants to eat, after attempt meals patient likely aspirated again, showed signs  of continued decline,  9/3 family now is interested in hospice care, palliative team input appreicated -9/4patient continue to decline, awaiting hospice placement  Dysphagia ---has been npo since admission, has been on tube feeds through [facility] tube, nutrition and speech input appreciated. -family and patient hanges his minds at times about peg tube placement initially, IR was consulted for peg tube placement initially, later family and patient decided no feeding tube. -family coordinated with speechtherapy for repeat swallow eval on 9/2,  -9/2 repeated swallow eval, showed risk of aspiration , family and patient are willing to take the risk of aspiration, now on puree diet and putting thick liquid, ng removed, appreciate palliative team intput, patient now DNR/DNI, no feeding tube, they agree with SNF placement and continue outpatient palliative care consult, patient and family has not decided whether to treat with abx if recurrent aspiration pneumonia. -9/3 family now thinking about hospice care 9/4 continued decline, awaiting for hospice placement.   Fall/physical deconditioning -family reported progressive weakness and multiple falls in the last month PT consulted and appreciated, recommended SNF -social work consulted for placement -9/3 SNF discharge cancelled, family want to discuss hospice care  Diabetes mellitus, type 2 -Metformin held -Continue insulin sliding scale CBG monitoring -a1c 6.4  Hyperlipidemia -Continue statin, d/c  statin, patient now too weak to take meds  Essential hypertension -Continue Cozaar  BPH -Continue Proscar, Flomax  Hypothyroidism -Continue Synthroid  Code Status: DNR/DNI  Family Communication:  Patient, daughter and wife in room  Disposition Plan:   SNF discharge cancelled, now considering hospice  Time Spent in minutes   35 minutes  Procedures  Modified Barium swallow  Consults   Palliative care  DVT Prophylaxis  Lovenox  Lab  Results  Component Value Date   PLT 317 02/28/2015    Medications  Scheduled Meds: . enoxaparin (LOVENOX) injection  40 mg Subcutaneous Q24H  . insulin aspart  0-5 Units Subcutaneous QHS  . insulin aspart  0-9 Units Subcutaneous TID WC  . insulin glargine  10 Units Subcutaneous QHS  . latanoprost  1 drop Both Eyes QHS  . levothyroxine  50 mcg Per Tube QAC breakfast  . losartan  100 mg Per Tube Daily  . metoprolol tartrate  25 mg Oral BID  . multivitamin with minerals  1 tablet Per Tube Daily  . pantoprazole sodium  40 mg Per Tube Daily   Continuous Infusions: . feeding supplement (JEVITY 1.2 CAL) Stopped (02/28/15 0940)   PRN Meds:.  Antibiotics    Anti-infectives    Start     Dose/Rate Route Frequency Ordered Stop   03/01/15 0000  amoxicillin-clavulanate (AUGMENTIN) 875-125 MG per tablet     1 tablet Oral 2 times daily 03/01/15 1107     02/24/15 0600  piperacillin-tazobactam (ZOSYN) IVPB 3.375 g  Status:  Discontinued     3.375 g 12.5 mL/hr over 240 Minutes Intravenous Every 8 hours 02/24/15 0027 02/28/15 1223   02/24/15 0030  piperacillin-tazobactam (ZOSYN) IVPB 3.375 g     3.375 g 100 mL/hr over 30 Minutes Intravenous  Once 02/24/15 0027 02/24/15 0130   02/23/15 1700  vancomycin (VANCOCIN) 1,250 mg in sodium chloride 0.9 % 250 mL IVPB  Status:  Discontinued     1,250 mg 166.7 mL/hr over 90 Minutes Intravenous Every 24 hours 02/23/15 1556 02/27/15 1103   02/22/15 1630  cefTRIAXone (ROCEPHIN) 1 g in dextrose 5 % 50 mL IVPB  Status:  Discontinued     1 g 100 mL/hr over 30 Minutes Intravenous Every 24 hours 02/21/15 2054 02/23/15 1545   02/21/15 1630  cefTRIAXone (ROCEPHIN) 1 g in dextrose 5 % 50 mL IVPB     1 g 100 mL/hr over 30 Minutes Intravenous  Once 02/21/15 1626 02/21/15 1822      Subjective:   Kurt Navarro seen and examined today.  Continued decline, barely awake, drooling  Objective:   Filed Vitals:   03/01/15 1332 03/01/15 2127 03/02/15 0556 03/02/15  1311  BP:  175/72 155/58 151/91  Pulse: 96 96 82 92  Temp: 99.6 F (37.6 C) 99.3 F (37.4 C) 99.4 F (37.4 C)   TempSrc: Oral Oral Oral   Resp:  18 18   Height:      Weight:      SpO2: 95% 95% 91%     Wt Readings from Last 3 Encounters:  03/01/15 132 lb 7.9 oz (60.1 kg)  12/12/14 146 lb (66.225 kg)  12/11/14 146 lb (66.225 kg)     Intake/Output Summary (Last 24 hours) at 03/02/15 1731 Last data filed at 03/02/15 1132  Gross per 24 hour  Intake      0 ml  Output    650 ml  Net   -650 ml    Exam  General: frail, elderly, chronically  ill-appearing,   HEENT: NCAT, mucous membranes moist.   Cardiovascular: S1 S2 auscultated, RRR  Respiratory: Diminished breath sounds, Course at bases.  Abdomen: Soft, nontender, nondistended, + bowel sounds  Extremities: warm dry without cyanosis clubbing or edema  Neuro: barely awake, continued decline  Skin: bruising and ecchymosis noted on the extremities.  Data Review   Micro Results Recent Results (from the past 240 hour(s))  Urine culture     Status: None   Collection Time: 02/21/15  5:10 PM  Result Value Ref Range Status   Specimen Description URINE, CLEAN CATCH  Final   Special Requests Immunocompromised  Final   Culture >=100,000 COLONIES/mL ENTEROCOCCUS SPECIES  Final   Report Status 02/24/2015 FINAL  Final   Organism ID, Bacteria ENTEROCOCCUS SPECIES  Final      Susceptibility   Enterococcus species - MIC*    AMPICILLIN <=2 RESISTANT Resistant     LEVOFLOXACIN >=8 RESISTANT Resistant     NITROFURANTOIN <=16 SENSITIVE Sensitive     VANCOMYCIN 1 SENSITIVE Sensitive     * >=100,000 COLONIES/mL ENTEROCOCCUS SPECIES    Radiology Reports Dg Chest 2 View  02/21/2015   CLINICAL DATA:  Multiple falls yesterday, increased confusion, incontinence, decreased appetite, history hypertension, diabetes mellitus, coronary artery disease, former smoker  EXAM: CHEST  2 VIEW  COMPARISON:  02/13/2015  FINDINGS: Low lung volumes.   Upper normal heart size.  Atherosclerotic calcification aorta.  Pulmonary vascular congestion.  Bibasilar atelectasis, increased.  Apices clear.  Questionable small RIGHT pleural effusion.  No pneumothorax.  Bones demineralized.  IMPRESSION: Decreased lung volumes with increased bibasilar atelectasis and questionable small RIGHT pleural effusion.   Electronically Signed   By: Ulyses Southward M.D.   On: 02/21/2015 18:17   Dg Chest 2 View  02/05/2015   CLINICAL DATA:  Larey Seat. Week and soreness all over. Tenderness in the back and right shoulder area.  EXAM: CHEST  2 VIEW  COMPARISON:  04/28/2014  FINDINGS: There are old right rib fractures. Age-indeterminate fractures involving the left fifth and sixth ribs. Densities at the left lung base could represent atelectasis or scarring. Heart size is grossly normal with atherosclerotic calcifications at the aorta. Negative for a pneumothorax. Again noted is a compression fracture in the upper lumbar spine.  IMPRESSION: Low lung volumes bilaterally.  Bilateral rib fractures. Left rib fractures are age indeterminate and new from the prior examination.   Electronically Signed   By: Richarda Overlie M.D.   On: 02/05/2015 07:55   Dg Lumbar Spine Complete  02/05/2015   CLINICAL DATA:  Injury, fall in the bathroom  EXAM: LUMBAR SPINE - COMPLETE 4+ VIEW  COMPARISON:  01/03/2015  FINDINGS: Five views of the lumbar spine submitted. There is diffuse osteopenia. Atherosclerotic calcifications of abdominal aorta and iliac arteries. Again noted compression deformity upper endplate of L1 vertebral body is stable from prior exam. There is disc space flattening with mild anterior spurring at T12-L1 and L1-L2 level. Mild anterior spurring upper endplate of L3 and L4 vertebral body. Minimal disc space flattening at L5-S1 level. There is mild lower lumbar levoscoliosis.  IMPRESSION: No acute fracture or subluxation. Mild lower lumbar levoscoliosis. Degenerative changes as described above. Stable  compression fracture upper endplate of L1 vertebral body. Diffuse osteopenia.   Electronically Signed   By: Natasha Mead M.D.   On: 02/05/2015 08:05   Dg Shoulder Right  02/05/2015   CLINICAL DATA:  Larey Seat with weakness. Tenderness in the back and right shoulder area.  EXAM:  RIGHT SHOULDER - 2+ VIEW  COMPARISON:  04/28/2014  FINDINGS: There are old right rib fractures. The right shoulder is located without a fracture. Right AC joint is intact. Evidence for spurring and degenerative changes at the right glenohumeral joint.  IMPRESSION: No acute bone abnormality in the right shoulder.   Electronically Signed   By: Richarda Overlie M.D.   On: 02/05/2015 08:00   Ct Head Wo Contrast  02/21/2015   CLINICAL DATA:  Multiple falls with 2 yesterday. Confusion. Incontinence. Decreased appetite.  EXAM: CT HEAD WITHOUT CONTRAST  TECHNIQUE: Contiguous axial images were obtained from the base of the skull through the vertex without intravenous contrast.  COMPARISON:  None  FINDINGS: Sinuses/Soft tissues: Surgical changes about the globes. Mucosal thickening of the left maxillary sinus. No skull fracture. Clear mastoid air cells.  Intracranial: Expected cerebral atrophy. Moderate low density in the periventricular white matter likely related to small vessel disease. No mass lesion, hemorrhage, hydrocephalus, acute infarct, intra-axial, or extra-axial fluid collection.  IMPRESSION: 1.  No acute intracranial abnormality. 2.  Cerebral atrophy and small vessel ischemic change. 3. Mild sinus disease.   Electronically Signed   By: Jeronimo Greaves M.D.   On: 02/21/2015 17:56   Ct Head Wo Contrast  02/05/2015   CLINICAL DATA:  Fall.  Hit head.  EXAM: CT HEAD WITHOUT CONTRAST  TECHNIQUE: Contiguous axial images were obtained from the base of the skull through the vertex without intravenous contrast.  COMPARISON:  None.  FINDINGS: There is diffuse low attenuation throughout the subcortical and periventricular white matter compatible with chronic  microvascular disease. Prominence of the sulci and the ventricles noted consistent with brain atrophy. No abnormal extra-axial fluid collections, intracranial hemorrhage or mass noted. There is mild mucosal thickening involving the maxillary sinuses. The paranasal sinuses are clear. The mastoid air cells appear clear. The calvarium is intact.  IMPRESSION: 1. No acute intracranial abnormality. 2. Chronic microvascular disease and brain atrophy.   Electronically Signed   By: Signa Kell M.D.   On: 02/05/2015 08:26   Ct Chest W Contrast  02/05/2015   CLINICAL DATA:  Fall.  Right rib pain  EXAM: CT CHEST WITH CONTRAST  TECHNIQUE: Multidetector CT imaging of the chest was performed during intravenous contrast administration.  CONTRAST:  OMNIPAQUE IOHEXOL 300 MG/ML  SOLN  COMPARISON:  Chest x-ray 02/05/2015.  No prior chest CT for review  FINDINGS: Left upper lobe mass lesion measures 3.5 x 11.6 cm. This has spiculated margins and is worrisome for carcinoma. Additional spiculated mass in the superior segment left lower lobe adjacent to the major fissure measures approximately 23 x 13 mm. These lesions are difficult to see on chest x-ray due to their location. Both lesions are worrisome for carcinoma. No mediastinal adenopathy.  Chronic lung disease with probable interstitial scarring on the right.  Right lower lobe airspace disease may represent atelectasis or pneumonia. Small right effusion. Mild left lower lobe atelectasis.  Coronary artery calcification. Heart size normal. Atherosclerotic calcification aortic arch without aneurysm or dissection. No central pulmonary embolus.  Chronic right rib fractures with deformity. No acute right rib fracture. Left rib fractures appear subacute with callus formation. No acute left rib fracture.  Negative for mass in the upper abdomen.  Mild compression fracture at T11 of indeterminate age.  IMPRESSION: Spiculated mass left upper lobe and superior segment left lower  lobe. These findings are concerning for carcinoma of the lung. Pneumonia and fibrosis considered less likely. PET-CT recommended for further  evaluation.  Probable chronic lung disease with interstitial scarring on the right. Right lower lobe atelectasis/ infiltrate. Small right effusion. Pneumonia not excluded on the right.  Bilateral rib fractures appear subacute to chronic. No acute rib fracture.  Mild compression fracture T11, possibly acute.  These results were called by telephone at the time of interpretation on 02/05/2015 at 11:18 am to [facility] , who verbally acknowledged these results.   Electronically Signed   By: Marlan Palau M.D.   On: 02/05/2015 11:19   Ct Cervical Spine Wo Contrast  02/05/2015   CLINICAL DATA:  81 year old with recent fall. Head laceration. Complains of pain in the upper back.  EXAM: CT CERVICAL SPINE WITHOUT CONTRAST  TECHNIQUE: Multidetector CT imaging of the cervical spine was performed without intravenous contrast. Multiplanar CT image reconstructions were also generated.  COMPARISON:  01/25/2008  FINDINGS: Negative for an acute fracture dislocation in the cervical spine. There is mild sclerosis and deformity of the T3 superior endplate which appears new since 2009. There is mild retrolisthesis at C3-C4 with disc space narrowing. Bilateral facet arthropathy. No significant soft tissue edema. No significant neck lymphadenopathy.  Images of the upper lungs demonstrate a 2.7 x 1.1 cm opacity in the medial left upper lobe. This opacity contains some scattered calcifications. There are additional patchy opacities and calcifications in the right upper lung. There is also a small focal opacity in the medial superior segment of the left lower lobe measuring up to 1.5 cm.  IMPRESSION: Degenerative changes in cervical spine without acute bone abnormality.  Mild compression deformity of the T3 vertebral body of unknown age. Legrand Rams an old injury based on the sclerosis but recommend  clinical correlation.  There are focal parenchymal densities or lesions in the left upper lung, particularly in the left upper lobe. These appear to be new since 2009. Findings are nonspecific but a neoplastic process cannot be excluded. Recommend further characterization with a post contrast chest CT.  These results were called by telephone at the time of interpretation on 02/05/2015 at 8:42 am to [facility], PA-C, who verbally acknowledged these results.   Electronically Signed   By: Richarda Overlie M.D.   On: 02/05/2015 08:52   Mr Brain Wo Contrast  02/28/2015   CLINICAL DATA:  Altered mental status.  EXAM: MRI HEAD WITHOUT CONTRAST  TECHNIQUE: Multiplanar, multiecho pulse sequences of the brain and surrounding structures were obtained without intravenous contrast.  COMPARISON:  Head CT 02/21/2015  FINDINGS: A non expanded partially empty sella is incidentally noted. There is no evidence of acute infarct, intracranial hemorrhage, mass, midline shift, or extra-axial fluid collection. There is moderate generalized cerebral and cerebellar atrophy. Small, chronic cortical and subcortical infarcts are present in the posterior right frontal lobe and right parietal lobe. Subcortical and periventricular cerebral white matter T2 hyperintensities elsewhere are nonspecific but compatible with moderate chronic small vessel ischemic disease.  Prior bilateral cataract extraction is noted. Paranasal sinuses are clear. There is trace right mastoid fluid. Major intracranial vascular flow voids are preserved.  IMPRESSION: 1. No acute intracranial abnormality. 2. Moderate chronic small vessel ischemic disease and cerebral atrophy. 3. 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   Electronically Signed   By: Sebastian Ache M.D.   On: 02/28/2015 10:55   Dg Chest Port 1 View  02/24/2015   CLINICAL DATA:  Increased tracheal secretions, acute onset. Initial encounter.  EXAM: PORTABLE CHEST - 1 VIEW  COMPARISON:  Chest  radiograph performed 02/21/2015  FINDINGS:  The lungs remain hypoexpanded. Patchy bibasilar airspace opacities, right greater than left, raise concern for pneumonia, given the patient's symptoms. A small right pleural effusion is noted. No pneumothorax is seen.  The cardiomediastinal silhouette remains normal in size. No acute osseous abnormalities are identified.  IMPRESSION: Lungs hypoexpanded. Patchy bibasilar airspace opacities, right greater than left, raise concern for pneumonia given the patient's symptoms. Small right pleural effusion noted.   Electronically Signed   By: Roanna Raider M.D.   On: 02/24/2015 00:01   Dg Abd Portable 1v  02/24/2015   CLINICAL DATA:  NG tube placement  EXAM: PORTABLE ABDOMEN - 1 VIEW  COMPARISON:  No recent similar comparison exam  FINDINGS: Feeding tube tip terminates over the expected location of the gastric cardia. Right greater than left basilar lung field opacification is partly visualized. Normal bowel gas pattern in visualized aspects with bowel retained oral contrast.  IMPRESSION: Feeding tube tip terminates over the expected location of the gastric cardia.   Electronically Signed   By: Christiana Pellant M.D.   On: 02/24/2015 19:44   Dg Swallowing Func-speech Pathology  02/28/2015    Objective Swallowing Evaluation:   Modified Barium Swallow Patient Details  Name: Kurt Navarro MRN: [id number] Date of Birth: [birth year]  Today's Date: 02/28/2015 Time: SLP Start Time (ACUTE ONLY): 1100-SLP Stop Time (ACUTE ONLY): 1137 SLP Time Calculation (min) (ACUTE ONLY): 37 min  Past Medical History:  Past Medical History  Diagnosis Date  . Diabetes mellitus without complication   . Hypertension   . Hypercholesteremia   . Heart murmur   . Frequent urination   . BPH (benign prostatic hyperplasia)   . GERD (gastroesophageal reflux disease)   . Coronary artery disease     medical management with aspirin  . Glaucoma    Past Surgical History:  Past Surgical History  Procedure Laterality Date   . Leg surgery  5 years ago    Rod in right leg  . Back surgery  years ago    lower  . Hernia repair  years ago  . Cystoscopy with retrograde pyelogram, ureteroscopy and stent placement  Bilateral 12/12/2014    Procedure: CYSTO WITH BILATERAL RETROGRADE/BIOPSY WITH  FULGURATION/HYDRODISTENSION OF BLADDER;  Surgeon: Bjorn Pippin, MD;   Location: [facility];  Service: Urology;  Laterality: Bilateral;   HPI:  Other Pertinent Information: 81 y.o. male with a past medical history of  type 2 diabetes, hypertension, hyperlipidemia, heart murmur, BPH, GERD,  hernia repair, coronary artery disease, glaucoma who was brought in by his  son to the emergency department due to progressive weakness associated  with 4 falls in the last 9 days and confusion. CXR Patchy bibasilar  airspace opacities, right greater than left, raise concern for pneumonia  given the patient's symptoms. Small right pleural effusion noted. Head CT  negative. Found to have acute encephalopathy due to UTI. Repeat MBS for   No Data Recorded  Assessment / Plan / Recommendation CHL IP CLINICAL IMPRESSIONS 02/28/2015  Therapy Diagnosis Mild oral phase dysphagia;Moderate oral phase  dysphagia;Moderate cervical esophageal phase dysphagia;Severe cervical  esophageal phase dysphagia  Clinical Impression Pt exhibited minimally improved swallow function from  [facility] 8/29 with slight and flash penetration with puree although max  pyriform sinus and mod-max vallecular residue. Silent aspiration during  the swallow teaspoon honey thick  with therapeutic chin tuck ineffective  to prevent penetration/aspiration. Aspiration risk is high with any po's  at present, however Dys 1 (puree) texture and pudding thick liquids  recommended.  Continue ST at SNF with likely initiation of honey thick  liquids with known risks as pt's family have stated no PEG and they may  want comfort feeds/liquids. Recommend sit upright, small bites, swallow  2-3 additional times after bites to clear residue and  volitional throat  clear/cough after every several bites, full supervision and crush meds.        CHL IP TREATMENT RECOMMENDATION 02/28/2015  Treatment Recommendations Therapy as outlined in treatment plan below     CHL IP DIET RECOMMENDATION 02/28/2015  SLP Diet Recommendations Dysphagia 1 (Puree)  Liquid Administration via (None)  Medication Administration Crushed with puree  Compensations Slow rate;Small sips/bites;Multiple dry swallows after each  bite/sip;Hard cough after swallow;Clear throat intermittently  Postural Changes and/or Swallow Maneuvers (None)     CHL IP OTHER RECOMMENDATIONS 02/28/2015  Recommended Consults (None)  Oral Care Recommendations Oral care QID  Other Recommendations (None)     CHL IP FOLLOW UP RECOMMENDATIONS 02/26/2015  Follow up Recommendations Skilled Nursing facility     [facility] IP FREQUENCY AND DURATION 02/28/2015  Speech Therapy Frequency (ACUTE ONLY) min 2x/week  Treatment Duration 2 weeks     Pertinent Vitals/Pain none    SLP Swallow Goals No flowsheet data found.  No flowsheet data found.    CHL IP REASON FOR REFERRAL 02/28/2015  Reason for Referral Objectively evaluate swallowing function               No flowsheet data found.         Royce Macadamia 02/28/2015, 2:35 PM   Breck Coons Lonell Face.Ed CCC-SLP Pager [phone]    Dg Swallowing Func-speech Pathology  02/24/2015    Objective Swallowing Evaluation:   Modified Barium Swallow Patient Details  Name: Kurt Navarro MRN: [id number] Date of Birth: 1927-04-10  Today's Date: 02/24/2015 Time: SLP Start Time (ACUTE ONLY): 1305-SLP Stop Time (ACUTE ONLY): 1328 SLP Time Calculation (min) (ACUTE ONLY): 23 min  Past Medical History:  Past Medical History  Diagnosis Date  . Diabetes mellitus without complication   . Hypertension   . Hypercholesteremia   . Heart murmur   . Frequent urination   . BPH (benign prostatic hyperplasia)   . GERD (gastroesophageal reflux disease)   . Coronary artery disease     medical management with aspirin  . Glaucoma     Past Surgical History:  Past Surgical History  Procedure Laterality Date  . Leg surgery  5 years ago    Rod in right leg  . Back surgery  years ago    lower  . Hernia repair  years ago  . Cystoscopy with retrograde pyelogram, ureteroscopy and stent placement  Bilateral 12/12/2014    Procedure: CYSTO WITH BILATERAL RETROGRADE/BIOPSY WITH  FULGURATION/HYDRODISTENSION OF BLADDER;  Surgeon: Bjorn Pippin, MD;   Location: [facility];  Service: Urology;  Laterality: Bilateral;   HPI:  Other Pertinent Information: 81 y.o. male with a past medical history of  type 2 diabetes, hypertension, hyperlipidemia, heart murmur, BPH, GERD,  hernia repair, coronary artery disease, glaucoma who was brought in by his  son to the emergency department due to progressive weakness associated  with 4 falls in the last 9 days and confusion. CXR Patchy bibasilar  airspace opacities, right greater than left, raise concern for pneumonia  given the patient's symptoms. Small right pleural effusion noted. Head CT  negative. Found to have acute encephalopathy due to UTI.  No Data Recorded  Assessment / Plan / Recommendation CHL IP CLINICAL IMPRESSIONS 02/24/2015  Therapy Diagnosis Mild oral phase dysphagia;Moderate pharyngeal phase  dysphagia;Severe pharyngeal phase dysphagia  Clinical Impression Delayed oral transit and cohesion across  consistencies. Moderate-severe sensorimotor based pharyngeal dysphagia and  decreased sensation with delayed swallow initiation. Laryngeal penetration  and aspiration (silent) across all consistencies assessed during initial  swallow and in attempts to clear mod-max vallecular and moderate pyriform  sinus residue. Inadequate sensation to residue and unable to safety reduce  with max cues for multiple swallows. Pt at significant aspiration risk at  present wth clinical findings and current confusion, recommend NPO,  temporary means of nutrition and oral care. ST will follow.        CHL IP TREATMENT RECOMMENDATION 02/24/2015   Treatment Recommendations Therapy as outlined in treatment plan below     CHL IP DIET RECOMMENDATION 02/24/2015  SLP Diet Recommendations NPO;Alternative means - temporary  Liquid Administration via (None)  Medication Administration Via alternative means  Compensations (None)  Postural Changes and/or Swallow Maneuvers (None)     CHL IP OTHER RECOMMENDATIONS 02/24/2015  Recommended Consults (None)  Oral Care Recommendations Oral care QID  Other Recommendations (None)     No flowsheet data found.   CHL IP FREQUENCY AND DURATION 02/24/2015  Speech Therapy Frequency (ACUTE ONLY) min 2x/week  Treatment Duration 2 weeks     Pertinent Vitals/Pain none    SLP Swallow Goals No flowsheet data found.  No flowsheet data found.    CHL IP REASON FOR REFERRAL 02/24/2015  Reason for Referral Objectively evaluate swallowing function               No flowsheet data found.         Royce Macadamia 02/24/2015, 2:13 PM   Breck Coons Lonell Face.Ed CCC-SLP Pager [phone]      Dg Hip Unilat  With Pelvis 2-3 Views Right  02/21/2015   CLINICAL DATA:  Multiple falls over the past 10 days. Right hip pain and bruising. History of prior right intertrochanteric fracture. Initial encounter.  EXAM: DG HIP (WITH OR WITHOUT PELVIS) 2-3V RIGHT  COMPARISON:  Single view of the abdomen 12/26/2014.  FINDINGS: A hip screw and long IM nail are in place for fixation of a healed right intertrochanteric fracture. No acute fracture is identified. No notable degenerative change is identified. No focal bony lesion is seen.  IMPRESSION: No acute abnormality. Healed right intertrochanteric fracture with fixation hardware in place.   Electronically Signed   By: Drusilla Kanner M.D.   On: 02/21/2015 15:45    CBC  Recent Labs Lab 02/26/15 0632 02/28/15 0604  WBC 9.7 8.1  HGB 11.8* 11.9*  HCT 35.1* 36.6*  PLT 316 317  MCV 90.0 91.5  MCH 30.3 29.8  MCHC 33.6 32.5  RDW 14.6 14.4    Chemistries   Recent Labs Lab 02/25/15 0720 02/26/15 0632  02/27/15 0653 02/28/15 0604  NA 130* 132* 133* 135  K 3.9 3.5 3.4* 3.6  CL 97* 97* 96* 97*  CO2 24 26 29 31   GLUCOSE 121* 203* 234* 229*  BUN 10 13 13 14   CREATININE 0.86 0.93 0.86 0.84  CALCIUM 8.8* 8.5* 8.5* 8.9  MG  --   --  2.0  --   AST  --   --   --  15  ALT  --   --   --  25  ALKPHOS  --   --   --  83  BILITOT  --   --   --  0.4   ------------------------------------------------------------------------------------------------------------------  estimated creatinine clearance is 53.7 mL/min (by C-G formula based on Cr of 0.84). ------------------------------------------------------------------------------------------------------------------  Recent Labs  02/28/15 0604  HGBA1C 6.4*   ------------------------------------------------------------------------------------------------------------------ No results for input(s): CHOL, HDL, LDLCALC, TRIG, CHOLHDL, LDLDIRECT in the last 72 hours. ------------------------------------------------------------------------------------------------------------------ No results for input(s): TSH, T4TOTAL, T3FREE, THYROIDAB in the last 72 hours.  Invalid input(s): FREET3 ------------------------------------------------------------------------------------------------------------------ No results for input(s): VITAMINB12, FOLATE, FERRITIN, TIBC, IRON, RETICCTPCT in the last 72 hours.  Coagulation profile  Recent Labs Lab 02/28/15 0604  INR 1.06    No results for input(s): DDIMER in the last 72 hours.  Cardiac Enzymes No results for input(s): CKMB, TROPONINI, MYOGLOBIN in the last 168 hours.  Invalid input(s): CK ------------------------------------------------------------------------------------------------------------------ Invalid input(s): POCBNP    Maryjane Benedict MD PhD. on 03/02/2015 at 5:31 PM  Between 7am to 7pm - Pager - [phone]  After 7pm go to [email or web address] - password [email or web address]  And look for the night coverage person  covering for me after hours  [facility] Office  [phone]

## 2015-03-02 NOTE — Social Work (Signed)
Referral Sent to [facility]. RN will meet with patient to review today, 03/02/15  Tivon Lemoine J Clotee Schlicker MSW, LCSW [phone] 

## 2015-03-02 NOTE — Progress Notes (Addendum)
Daily Progress Note   Patient Name: Kurt Navarro       Date: 03/02/2015 DOB: 1927/02/07  Age: 81 y.o. MRN#: [id number] Attending Physician: Albertine Grates, MD Primary Care Physician: Thayer Headings, MD Admit Date: 02/21/2015  Reason for Consultation/Follow-up: Inpatient hospice referral, Non pain symptom management and Psychosocial/spiritual support  Subjective: Patient is much weaker and less responsive. He is sitting in the recliner. Nurse reports that he was a full two-person lift to recliner chair. He is very congested and requiring intermittent suctioning. He is very distant, he looks at me when I speak to him but is too weak to talk. Work of breathing noted Interval Events: Weaker more congested Length of Stay: 9 days  Current Medications: Scheduled Meds:  . enoxaparin (LOVENOX) injection  40 mg Subcutaneous Q24H  . ferrous sulfate  300 mg Per Tube BID WC  . insulin aspart  0-5 Units Subcutaneous QHS  . insulin aspart  0-9 Units Subcutaneous TID WC  . insulin glargine  10 Units Subcutaneous QHS  . lactobacillus  1 g Oral TID WC  . latanoprost  1 drop Both Eyes QHS  . levothyroxine  50 mcg Per Tube QAC breakfast  . losartan  100 mg Per Tube Daily  . metoprolol tartrate  25 mg Oral BID  . multivitamin with minerals  1 tablet Per Tube Daily  . pantoprazole sodium  40 mg Per Tube Daily  . pravastatin  20 mg Per Tube QPM    Continuous Infusions: . feeding supplement (JEVITY 1.2 CAL) Stopped (02/28/15 0940)    PRN Meds: glycopyrrolate, RESOURCE THICKENUP CLEAR  Palliative Performance Scale: 30%      Vital Signs: BP 155/58 mmHg  Pulse 82  Temp(Src) 99.4 F (37.4 C) (Oral)  Resp 18  Ht  (1.651 m)  Wt 60.1 kg (132 lb 7.9 oz)  BMI 22.05 kg/m2  SpO2 91% SpO2: SpO2: 91 % O2 Device: O2 Device: Not Delivered O2 Flow Rate:    Intake/output summary:  Intake/Output Summary (Last 24 hours) at 03/02/15 0925 Last data filed at 03/01/15 2000  Gross per 24 hour  Intake       0 ml  Output    500 ml  Net   -500 ml   LBM:   Baseline Weight: Weight: 65.4 kg (144 lb 2.9 oz) Most recent weight: Weight: 60.1 kg (132 lb 7.9 oz)  Physical Exam: Gen.: Frail elderly man, minimally responsive sitting in recliner chair Respiratory: Upper airway secretions noted. No visible shortness of breath Cardiac: Tachycardic              Additional Data Reviewed: Recent Labs     02/28/15  0604  WBC  8.1  HGB  11.9*  PLT  317  NA  135  BUN  14  CREATININE  0.84     Problem List:  Patient Active Problem List   Diagnosis Date Noted  . Dysphagia   . Palliative care encounter   . Increased tracheal secretions   . Dysphagia, pharyngoesophageal phase   . FTT (failure to thrive) in adult   . Altered mental status 02/21/2015  . Pressure ulcer 02/21/2015  . Hyperlipidemia 02/21/2015  . Essential hypertension 02/21/2015  . BPH (benign prostatic hypertrophy) 02/21/2015  . Diabetes mellitus, type 2 02/21/2015     Palliative Care Assessment & Plan    Code Status:  DNR  Goals of Care:  Told to go to inpatient hospice for end-of-life care  Symptom Management:  Secretions: This is  likely stemming from patient eating and ongoing aspiration. We'll start low-dose IV Robinul 0.1 every 8 hours as needed  Dyspnea: Needs improvement. Add low dose roxanol 5 mg q4 prn. Pt is opioid naive  Anxiety: Will add low dose ativan for associated anxiety with dyspnea secondary to aspiration   Psycho-social/Spiritual:  Desire for further Chaplaincy support:no   Prognosis: < 2 weeks Discharge Planning: Hospice facility   Care plan was discussed with RN  Thank you for allowing the Palliative Medicine Team to assist in the care of this patient.   Time In: 0800 Time Out: 0825 Total Time 25 min Prolonged Time Billed  no     Greater than 50%  of this time was spent counseling and coordinating care related to the above assessment and plan.   Irean Hong, NP    03/02/2015, 9:25 AM  Please contact Palliative Medicine Team phone at [phone] for questions and concerns.

## 2015-03-03 DIAGNOSIS — R1310 Dysphagia, unspecified: Secondary | ICD-10-CM

## 2015-03-03 DIAGNOSIS — J690 Pneumonitis due to inhalation of food and vomit: Principal | ICD-10-CM

## 2015-03-03 DIAGNOSIS — R400 Somnolence: Secondary | ICD-10-CM

## 2015-03-03 DIAGNOSIS — M549 Dorsalgia, unspecified: Secondary | ICD-10-CM

## 2015-03-03 DIAGNOSIS — N400 Enlarged prostate without lower urinary tract symptoms: Secondary | ICD-10-CM

## 2015-03-03 DIAGNOSIS — M546 Pain in thoracic spine: Secondary | ICD-10-CM

## 2015-03-03 DIAGNOSIS — M5489 Other dorsalgia: Secondary | ICD-10-CM

## 2015-03-03 DIAGNOSIS — L8990 Pressure ulcer of unspecified site, unspecified stage: Secondary | ICD-10-CM

## 2015-03-03 DIAGNOSIS — Z515 Encounter for palliative care: Secondary | ICD-10-CM

## 2015-03-03 LAB — GLUCOSE, CAPILLARY
GLUCOSE-CAPILLARY: 162 mg/dL — AB (ref 65–99)
GLUCOSE-CAPILLARY: 124 mg/dL — AB (ref 65–99)
Glucose-Capillary: 124 mg/dL — ABNORMAL HIGH (ref 65–99)

## 2015-03-03 MED ORDER — ATROPINE SULFATE 1 % OP SOLN: 2.0000 [drp] | Freq: Four times a day (QID) | OPHTHALMIC | Status: DC | PRN

## 2015-03-03 MED ORDER — MORPHINE SULFATE (CONCENTRATE) 10 MG/0.5ML PO SOLN: 5.0000 mg | ORAL | Status: AC | PRN

## 2015-03-03 MED ORDER — GLYCOPYRROLATE 0.2 MG/ML IJ SOLN: 0.1000 mg | Freq: Three times a day (TID) | INTRAMUSCULAR | Status: DC | PRN

## 2015-03-03 MED ORDER — MORPHINE SULFATE (CONCENTRATE) 10 MG/0.5ML PO SOLN
5.0000 mg | Freq: Four times a day (QID) | ORAL | Status: DC
  Administered 2015-03-03 – 2015-03-04 (×4): 5 mg via ORAL
  Filled 2015-03-03 (×5): qty 0.5

## 2015-03-03 MED ORDER — LORAZEPAM 0.5 MG PO TABS: 0.5000 mg | ORAL_TABLET | ORAL | Status: AC | PRN

## 2015-03-03 MED ORDER — MORPHINE SULFATE (CONCENTRATE) 10 MG/0.5ML PO SOLN: 5.0000 mg | ORAL | Status: DC | PRN

## 2015-03-03 NOTE — Discharge Summary (Addendum)
Discharge Summary  Hughes Wyndham ZOX:[id number] DOB: [birth year]  PCP: Thayer Headings, MD  Admit date: 02/21/2015 Discharge date: 03/04/2015  Time spent: >76mins  Recommendations for Outpatient Follow-up:  1. Patient is on comfort measures and to discharge to hospice facility  Discharge Diagnoses:  Active Hospital Problems   Diagnosis Date Noted  . Altered mental status 02/21/2015  . Back pain 03/03/2015  . Dysphagia   . Palliative care encounter   . Increased tracheal secretions   . Dysphagia, pharyngoesophageal phase   . FTT (failure to thrive) in adult   . Pressure ulcer 02/21/2015  . Hyperlipidemia 02/21/2015  . Essential hypertension 02/21/2015  . BPH (benign prostatic hypertrophy) 02/21/2015  . Diabetes mellitus, type 2 02/21/2015    Resolved Hospital Problems   Diagnosis Date Noted Date Resolved  No resolved problems to display.    Discharge Condition: stable  Diet recommendation: pleasure feeding  Filed Weights   02/27/15 0431 02/28/15 1952 03/01/15 0557  Weight: 132 lb 11.5 oz (60.2 kg) 132 lb 4.4 oz (60 kg) 132 lb 7.9 oz (60.1 kg)    History of present illness:  Shigeo Baugh is a 81 y.o. male with a past medical history of type 2 diabetes, hypertension, hyperlipidemia, heart murmur, BPH, GERD, coronary artery disease, glaucoma who was brought in by his son to the emergency department due to progressive weakness associated with 4 falls in the last 9 days and confusion. Apparently the patient has not hit his head, he hasn't have fever or chills, but he is usually alert oriented 3 and has been unable to tell the current date even down to the year, as well as being confused with other events for the past week plus.He is he is oriented to place and is currently in no acute distress. He is unable to provide further details on his history.  Hospital Course:  Principal Problem:   Altered mental status Active Problems:   Pressure ulcer   Hyperlipidemia   Essential  hypertension   BPH (benign prostatic hypertrophy)   Diabetes mellitus, type 2   Increased tracheal secretions   Dysphagia, pharyngoesophageal phase   FTT (failure to thrive) in adult   Palliative care encounter   Dysphagia   Back pain  Acute encephalopathy secondary UTI/Pneumonia -CT head: No acute intracranial abnormality -CXR 8/26: Decreased lung volumes with increased bibasilar atelectasis, questionable small right pleural effusion, CXR 8/28: Has a bi-basilar airspace opacities, concerning for pneumonia, pneumonia likely secondary to aspiration. Aspiration pna likely a chronic condition, presented prior to admission. Strep pneumonia urine antigen negative, legionella urine antigen negative -UA: WBC TNTC, Large leukocytes , Urine culture >100K Enterococcus, finished treatment with IV vancomycin --finished treatment with zosyn for possible aspiration pna., patient and family aware of and accept the risk of recurrent aspiration pneumonia, patient wants to eat, after attempt meals patient likely aspirated again, showed signs of continued decline,  9/3 family now is interested in hospice care, palliative team input appreicated -9/4patient continue to decline, awaiting hospice placement  Dysphagia ---has been npo since admission, has been on tube feeds through [facility] tube, nutrition and speech input appreciated. -family and patient hanges his minds at times about peg tube placement initially, IR was consulted for peg tube placement initially, later family and patient decided no feeding tube. -family coordinated with speechtherapy for repeat swallow eval on 9/2,  -9/2 repeated swallow eval, showed risk of aspiration , family and patient are willing to take the risk of aspiration, now on puree diet and putting thick  liquid, ng removed, appreciate palliative team intput, patient now DNR/DNI, no feeding tube, they agree with SNF placement and continue outpatient palliative care consult, patient and  family has not decided whether to treat with abx if recurrent aspiration pneumonia. -9/3 family now thinking about hospice care 9/4 continued decline, awaiting for hospice placement. 9/6 Patient is on prn ativan , scheduled sublingual morphine for oral secretion control / sign of air hunger and back pain. Scopolamine patch for oral secretion control. He is to discharge with foley for comfort.   Fall/physical deconditioning -family reported progressive weakness and multiple falls in the last month PT consulted and appreciated, recommended SNF -social work consulted for placement -9/3 SNF discharge cancelled, family want to discuss hospice care 9/5 discharge to hospice facility due to continued decline,   Diabetes mellitus, type 2 -nsulin sliding scale in the hospital -a1c 6.4   Hyperlipidemia - d/c statin, patient now too weak to take meds  Essential hypertension -bp stable off meds  BPH - Proscar, Flomax stopped Foley in place  Hypothyroidism -Synthroid stopped  Code Status: DNR/DNI  Family Communication: Patient and wife in room  Disposition Plan:  Hospice facility    Procedures  Modified Barium swallow  Consults  Palliative care   Discharge Exam: BP 108/48 mmHg  Pulse 101  Temp(Src) 99.9 F (37.7 C) (Oral)  Resp 20  Ht 5\' 5"  (1.651 m)  Wt 132 lb 7.9 oz (60.1 kg)  BMI 22.05 kg/m2  SpO2 96%   General: frail, elderly, chronically ill-appearing, progressive weak and continue decline.  HEENT: NCAT, mucous membranes moist.   Cardiovascular: S1 S2 auscultated, RRR  Respiratory: Diminished breath sounds, Course at bases.  Abdomen: Soft, nontender, nondistended, + bowel sounds  Extremities: warm dry without cyanosis clubbing or edema  Neuro: barely awake, continued decline  Skin: bruising and ecchymosis noted on the extremities   Discharge Instructions You were cared for by a hospitalist during your hospital stay. If you have any questions  about your discharge medications or the care you received while you were in the hospital after you are discharged, you can call the unit and asked to speak with the hospitalist on call if the hospitalist that took care of you is not available. Once you are discharged, your primary care physician will handle any further medical issues. Please note that NO REFILLS for any discharge medications will be authorized once you are discharged, as it is imperative that you return to your primary care physician (or establish a relationship with a primary care physician if you do not have one) for your aftercare needs so that they can reassess your need for medications and monitor your lab values.      Discharge Instructions    DIET - DYS 1    Complete by:  As directed   Fluid consistency:  Pudding Thick            Medication List    STOP taking these medications        aspirin 325 MG tablet     ferrous sulfate 325 (65 FE) MG EC tablet     finasteride 5 MG tablet  Commonly known as:  PROSCAR     HYDROcodone-acetaminophen 5-325 MG per tablet  Commonly known as:  NORCO/VICODIN     ibandronate 150 MG tablet  Commonly known as:  BONIVA     ICAPS PO     levothyroxine 50 MCG tablet  Commonly known as:  SYNTHROID, LEVOTHROID     losartan  100 MG tablet  Commonly known as:  COZAAR     metFORMIN 500 MG tablet  Commonly known as:  GLUCOPHAGE     multivitamin with minerals Tabs tablet     naproxen sodium 220 MG tablet  Commonly known as:  ANAPROX     omeprazole 20 MG capsule  Commonly known as:  PRILOSEC     phenazopyridine 100 MG tablet  Commonly known as:  PYRIDIUM     pravastatin 20 MG tablet  Commonly known as:  PRAVACHOL     tamsulosin 0.4 MG Caps capsule  Commonly known as:  FLOMAX     traMADol 50 MG tablet  Commonly known as:  ULTRAM      TAKE these medications        LORazepam 0.5 MG tablet  Commonly known as:  ATIVAN  Take 1 tablet (0.5 mg total) by mouth every 4  (four) hours as needed for anxiety or sleep.     morphine CONCENTRATE 10 MG/0.5ML Soln concentrated solution  Take 0.25 mLs (5 mg total) by mouth every 4 (four) hours as needed for moderate pain, severe pain or shortness of breath.     scopolamine 1 MG/3DAYS  Commonly known as:  TRANSDERM-SCOP  Place 1 patch (1.5 mg total) onto the skin every 3 (three) days.     Travoprost (BAK Free) 0.004 % Soln ophthalmic solution  Commonly known as:  TRAVATAN  Place 1 drop into both eyes at bedtime.       No Known Allergies Follow-up Information    Follow up with Thayer Headings, MD In 1 week.   Specialty:  Internal Medicine   Why:  hospital discharge follow up   Contact information:   [address] Thresa Ross [place] [address] [phone]        The results of significant diagnostics from this hospitalization (including imaging, microbiology, ancillary and laboratory) are listed below for reference.    Significant Diagnostic Studies: Dg Chest 2 View  02/21/2015   CLINICAL DATA:  Multiple falls yesterday, increased confusion, incontinence, decreased appetite, history hypertension, diabetes mellitus, coronary artery disease, former smoker  EXAM: CHEST  2 VIEW  COMPARISON:  02/13/2015  FINDINGS: Low lung volumes.  Upper normal heart size.  Atherosclerotic calcification aorta.  Pulmonary vascular congestion.  Bibasilar atelectasis, increased.  Apices clear.  Questionable small RIGHT pleural effusion.  No pneumothorax.  Bones demineralized.  IMPRESSION: Decreased lung volumes with increased bibasilar atelectasis and questionable small RIGHT pleural effusion.   Electronically Signed   By: Ulyses Southward M.D.   On: 02/21/2015 18:17   Dg Chest 2 View  02/05/2015   CLINICAL DATA:  Larey Seat. Week and soreness all over. Tenderness in the back and right shoulder area.  EXAM: CHEST  2 VIEW  COMPARISON:  04/28/2014  FINDINGS: There are old right rib fractures. Age-indeterminate fractures involving the  left fifth and sixth ribs. Densities at the left lung base could represent atelectasis or scarring. Heart size is grossly normal with atherosclerotic calcifications at the aorta. Negative for a pneumothorax. Again noted is a compression fracture in the upper lumbar spine.  IMPRESSION: Low lung volumes bilaterally.  Bilateral rib fractures. Left rib fractures are age indeterminate and new from the prior examination.   Electronically Signed   By: Richarda Overlie M.D.   On: 02/05/2015 07:55   Dg Lumbar Spine Complete  02/05/2015   CLINICAL DATA:  Injury, fall in the bathroom  EXAM: LUMBAR SPINE - COMPLETE 4+ VIEW  COMPARISON:  01/03/2015  FINDINGS: Five views of the lumbar spine submitted. There is diffuse osteopenia. Atherosclerotic calcifications of abdominal aorta and iliac arteries. Again noted compression deformity upper endplate of L1 vertebral body is stable from prior exam. There is disc space flattening with mild anterior spurring at T12-L1 and L1-L2 level. Mild anterior spurring upper endplate of L3 and L4 vertebral body. Minimal disc space flattening at L5-S1 level. There is mild lower lumbar levoscoliosis.  IMPRESSION: No acute fracture or subluxation. Mild lower lumbar levoscoliosis. Degenerative changes as described above. Stable compression fracture upper endplate of L1 vertebral body. Diffuse osteopenia.   Electronically Signed   By: Natasha Mead M.D.   On: 02/05/2015 08:05   Dg Shoulder Right  02/05/2015   CLINICAL DATA:  Larey Seat with weakness. Tenderness in the back and right shoulder area.  EXAM: RIGHT SHOULDER - 2+ VIEW  COMPARISON:  04/28/2014  FINDINGS: There are old right rib fractures. The right shoulder is located without a fracture. Right AC joint is intact. Evidence for spurring and degenerative changes at the right glenohumeral joint.  IMPRESSION: No acute bone abnormality in the right shoulder.   Electronically Signed   By: Richarda Overlie M.D.   On: 02/05/2015 08:00   Ct Head Wo  Contrast  02/21/2015   CLINICAL DATA:  Multiple falls with 2 yesterday. Confusion. Incontinence. Decreased appetite.  EXAM: CT HEAD WITHOUT CONTRAST  TECHNIQUE: Contiguous axial images were obtained from the base of the skull through the vertex without intravenous contrast.  COMPARISON:  None  FINDINGS: Sinuses/Soft tissues: Surgical changes about the globes. Mucosal thickening of the left maxillary sinus. No skull fracture. Clear mastoid air cells.  Intracranial: Expected cerebral atrophy. Moderate low density in the periventricular white matter likely related to small vessel disease. No mass lesion, hemorrhage, hydrocephalus, acute infarct, intra-axial, or extra-axial fluid collection.  IMPRESSION: 1.  No acute intracranial abnormality. 2.  Cerebral atrophy and small vessel ischemic change. 3. Mild sinus disease.   Electronically Signed   By: Jeronimo Greaves M.D.   On: 02/21/2015 17:56   Ct Head Wo Contrast  02/05/2015   CLINICAL DATA:  Fall.  Hit head.  EXAM: CT HEAD WITHOUT CONTRAST  TECHNIQUE: Contiguous axial images were obtained from the base of the skull through the vertex without intravenous contrast.  COMPARISON:  None.  FINDINGS: There is diffuse low attenuation throughout the subcortical and periventricular white matter compatible with chronic microvascular disease. Prominence of the sulci and the ventricles noted consistent with brain atrophy. No abnormal extra-axial fluid collections, intracranial hemorrhage or mass noted. There is mild mucosal thickening involving the maxillary sinuses. The paranasal sinuses are clear. The mastoid air cells appear clear. The calvarium is intact.  IMPRESSION: 1. No acute intracranial abnormality. 2. Chronic microvascular disease and brain atrophy.   Electronically Signed   By: Signa Kell M.D.   On: 02/05/2015 08:26   Ct Chest W Contrast  02/05/2015   CLINICAL DATA:  Fall.  Right rib pain  EXAM: CT CHEST WITH CONTRAST  TECHNIQUE: Multidetector CT imaging of the  chest was performed during intravenous contrast administration.  CONTRAST:  OMNIPAQUE IOHEXOL 300 MG/ML  SOLN  COMPARISON:  Chest x-ray 02/05/2015.  No prior chest CT for review  FINDINGS: Left upper lobe mass lesion measures 3.5 x 11.6 cm. This has spiculated margins and is worrisome for carcinoma. Additional spiculated mass in the superior segment left lower lobe adjacent to the major fissure measures approximately 23 x 13 mm. These lesions  are difficult to see on chest x-ray due to their location. Both lesions are worrisome for carcinoma. No mediastinal adenopathy.  Chronic lung disease with probable interstitial scarring on the right.  Right lower lobe airspace disease may represent atelectasis or pneumonia. Small right effusion. Mild left lower lobe atelectasis.  Coronary artery calcification. Heart size normal. Atherosclerotic calcification aortic arch without aneurysm or dissection. No central pulmonary embolus.  Chronic right rib fractures with deformity. No acute right rib fracture. Left rib fractures appear subacute with callus formation. No acute left rib fracture.  Negative for mass in the upper abdomen.  Mild compression fracture at T11 of indeterminate age.  IMPRESSION: Spiculated mass left upper lobe and superior segment left lower lobe. These findings are concerning for carcinoma of the lung. Pneumonia and fibrosis considered less likely. PET-CT recommended for further evaluation.  Probable chronic lung disease with interstitial scarring on the right. Right lower lobe atelectasis/ infiltrate. Small right effusion. Pneumonia not excluded on the right.  Bilateral rib fractures appear subacute to chronic. No acute rib fracture.  Mild compression fracture T11, possibly acute.  These results were called by telephone at the time of interpretation on 02/05/2015 at 11:18 am to [facility] , who verbally acknowledged these results.   Electronically Signed   By: Marlan Palau M.D.   On: 02/05/2015  11:19   Ct Cervical Spine Wo Contrast  02/05/2015   CLINICAL DATA:  81 year old with recent fall. Head laceration. Complains of pain in the upper back.  EXAM: CT CERVICAL SPINE WITHOUT CONTRAST  TECHNIQUE: Multidetector CT imaging of the cervical spine was performed without intravenous contrast. Multiplanar CT image reconstructions were also generated.  COMPARISON:  01/25/2008  FINDINGS: Negative for an acute fracture dislocation in the cervical spine. There is mild sclerosis and deformity of the T3 superior endplate which appears new since 2009. There is mild retrolisthesis at C3-C4 with disc space narrowing. Bilateral facet arthropathy. No significant soft tissue edema. No significant neck lymphadenopathy.  Images of the upper lungs demonstrate a 2.7 x 1.1 cm opacity in the medial left upper lobe. This opacity contains some scattered calcifications. There are additional patchy opacities and calcifications in the right upper lung. There is also a small focal opacity in the medial superior segment of the left lower lobe measuring up to 1.5 cm.  IMPRESSION: Degenerative changes in cervical spine without acute bone abnormality.  Mild compression deformity of the T3 vertebral body of unknown age. Legrand Rams an old injury based on the sclerosis but recommend clinical correlation.  There are focal parenchymal densities or lesions in the left upper lung, particularly in the left upper lobe. These appear to be new since 2009. Findings are nonspecific but a neoplastic process cannot be excluded. Recommend further characterization with a post contrast chest CT.  These results were called by telephone at the time of interpretation on 02/05/2015 at 8:42 am to [facility], PA-C, who verbally acknowledged these results.   Electronically Signed   By: Richarda Overlie M.D.   On: 02/05/2015 08:52   Mr Brain Wo Contrast  02/28/2015   CLINICAL DATA:  Altered mental status.  EXAM: MRI HEAD WITHOUT CONTRAST  TECHNIQUE: Multiplanar,  multiecho pulse sequences of the brain and surrounding structures were obtained without intravenous contrast.  COMPARISON:  Head CT 02/21/2015  FINDINGS: A non expanded partially empty sella is incidentally noted. There is no evidence of acute infarct, intracranial hemorrhage, mass, midline shift, or extra-axial fluid collection. There is moderate generalized cerebral and  cerebellar atrophy. Small, chronic cortical and subcortical infarcts are present in the posterior right frontal lobe and right parietal lobe. Subcortical and periventricular cerebral white matter T2 hyperintensities elsewhere are nonspecific but compatible with moderate chronic small vessel ischemic disease.  Prior bilateral cataract extraction is noted. Paranasal sinuses are clear. There is trace right mastoid fluid. Major intracranial vascular flow voids are preserved.  IMPRESSION: 1. No acute intracranial abnormality. 2. Moderate chronic small vessel ischemic disease and cerebral atrophy. 3. 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   Electronically Signed   By: Sebastian Ache M.D.   On: 02/28/2015 10:55   Dg Chest Port 1 View  02/24/2015   CLINICAL DATA:  Increased tracheal secretions, acute onset. Initial encounter.  EXAM: PORTABLE CHEST - 1 VIEW  COMPARISON:  Chest radiograph performed 02/21/2015  FINDINGS: The lungs remain hypoexpanded. Patchy bibasilar airspace opacities, right greater than left, raise concern for pneumonia, given the patient's symptoms. A small right pleural effusion is noted. No pneumothorax is seen.  The cardiomediastinal silhouette remains normal in size. No acute osseous abnormalities are identified.  IMPRESSION: Lungs hypoexpanded. Patchy bibasilar airspace opacities, right greater than left, raise concern for pneumonia given the patient's symptoms. Small right pleural effusion noted.   Electronically Signed   By: Roanna Raider M.D.   On: 02/24/2015 00:01   Dg Abd Portable 1v  02/24/2015   CLINICAL  DATA:  NG tube placement  EXAM: PORTABLE ABDOMEN - 1 VIEW  COMPARISON:  No recent similar comparison exam  FINDINGS: Feeding tube tip terminates over the expected location of the gastric cardia. Right greater than left basilar lung field opacification is partly visualized. Normal bowel gas pattern in visualized aspects with bowel retained oral contrast.  IMPRESSION: Feeding tube tip terminates over the expected location of the gastric cardia.   Electronically Signed   By: Christiana Pellant M.D.   On: 02/24/2015 19:44   Dg Swallowing Func-speech Pathology  02/28/2015    Objective Swallowing Evaluation:   Modified Barium Swallow Patient Details  Name: Arion Shankles MRN: [id number] Date of Birth: Sep 08, 1926  Today's Date: 02/28/2015 Time: SLP Start Time (ACUTE ONLY): 1100-SLP Stop Time (ACUTE ONLY): 1137 SLP Time Calculation (min) (ACUTE ONLY): 37 min  Past Medical History:  Past Medical History  Diagnosis Date  . Diabetes mellitus without complication   . Hypertension   . Hypercholesteremia   . Heart murmur   . Frequent urination   . BPH (benign prostatic hyperplasia)   . GERD (gastroesophageal reflux disease)   . Coronary artery disease     medical management with aspirin  . Glaucoma    Past Surgical History:  Past Surgical History  Procedure Laterality Date  . Leg surgery  5 years ago    Rod in right leg  . Back surgery  years ago    lower  . Hernia repair  years ago  . Cystoscopy with retrograde pyelogram, ureteroscopy and stent placement  Bilateral 12/12/2014    Procedure: CYSTO WITH BILATERAL RETROGRADE/BIOPSY WITH  FULGURATION/HYDRODISTENSION OF BLADDER;  Surgeon: Bjorn Pippin, MD;   Location: [facility];  Service: Urology;  Laterality: Bilateral;   HPI:  Other Pertinent Information: 81 y.o. male with a past medical history of  type 2 diabetes, hypertension, hyperlipidemia, heart murmur, BPH, GERD,  hernia repair, coronary artery disease, glaucoma who was brought in by his  son to the emergency department due to progressive  weakness associated  with 4 falls in the last 9 days and confusion.  CXR Patchy bibasilar  airspace opacities, right greater than left, raise concern for pneumonia  given the patient's symptoms. Small right pleural effusion noted. Head CT  negative. Found to have acute encephalopathy due to UTI. Repeat MBS for   No Data Recorded  Assessment / Plan / Recommendation CHL IP CLINICAL IMPRESSIONS 02/28/2015  Therapy Diagnosis Mild oral phase dysphagia;Moderate oral phase  dysphagia;Moderate cervical esophageal phase dysphagia;Severe cervical  esophageal phase dysphagia  Clinical Impression Pt exhibited minimally improved swallow function from  [facility] 8/29 with slight and flash penetration with puree although max  pyriform sinus and mod-max vallecular residue. Silent aspiration during  the swallow teaspoon honey thick  with therapeutic chin tuck ineffective  to prevent penetration/aspiration. Aspiration risk is high with any po's  at present, however Dys 1 (puree) texture and pudding thick liquids  recommended. Continue ST at SNF with likely initiation of honey thick  liquids with known risks as pt's family have stated no PEG and they may  want comfort feeds/liquids. Recommend sit upright, small bites, swallow  2-3 additional times after bites to clear residue and volitional throat  clear/cough after every several bites, full supervision and crush meds.        CHL IP TREATMENT RECOMMENDATION 02/28/2015  Treatment Recommendations Therapy as outlined in treatment plan below     CHL IP DIET RECOMMENDATION 02/28/2015  SLP Diet Recommendations Dysphagia 1 (Puree)  Liquid Administration via (None)  Medication Administration Crushed with puree  Compensations Slow rate;Small sips/bites;Multiple dry swallows after each  bite/sip;Hard cough after swallow;Clear throat intermittently  Postural Changes and/or Swallow Maneuvers (None)     CHL IP OTHER RECOMMENDATIONS 02/28/2015  Recommended Consults (None)  Oral Care Recommendations Oral care QID   Other Recommendations (None)     CHL IP FOLLOW UP RECOMMENDATIONS 02/26/2015  Follow up Recommendations Skilled Nursing facility     [facility] IP FREQUENCY AND DURATION 02/28/2015  Speech Therapy Frequency (ACUTE ONLY) min 2x/week  Treatment Duration 2 weeks     Pertinent Vitals/Pain none    SLP Swallow Goals No flowsheet data found.  No flowsheet data found.    CHL IP REASON FOR REFERRAL 02/28/2015  Reason for Referral Objectively evaluate swallowing function               No flowsheet data found.         Royce Macadamia 02/28/2015, 2:35 PM   Breck Coons Lonell Face.Ed CCC-SLP Pager [phone]    Dg Swallowing Func-speech Pathology  02/24/2015    Objective Swallowing Evaluation:   Modified Barium Swallow Patient Details  Name: Kipper Buch MRN: [id number] Date of Birth: [birth year]  Today's Date: 02/24/2015 Time: SLP Start Time (ACUTE ONLY): 1305-SLP Stop Time (ACUTE ONLY): 1328 SLP Time Calculation (min) (ACUTE ONLY): 23 min  Past Medical History:  Past Medical History  Diagnosis Date  . Diabetes mellitus without complication   . Hypertension   . Hypercholesteremia   . Heart murmur   . Frequent urination   . BPH (benign prostatic hyperplasia)   . GERD (gastroesophageal reflux disease)   . Coronary artery disease     medical management with aspirin  . Glaucoma    Past Surgical History:  Past Surgical History  Procedure Laterality Date  . Leg surgery  5 years ago    Rod in right leg  . Back surgery  years ago    lower  . Hernia repair  years ago  . Cystoscopy with retrograde pyelogram, ureteroscopy and stent placement  Bilateral  12/12/2014    Procedure: CYSTO WITH BILATERAL RETROGRADE/BIOPSY WITH  FULGURATION/HYDRODISTENSION OF BLADDER;  Surgeon: Bjorn Pippin, MD;   Location: [facility];  Service: Urology;  Laterality: Bilateral;   HPI:  Other Pertinent Information: 81 y.o. male with a past medical history of  type 2 diabetes, hypertension, hyperlipidemia, heart murmur, BPH, GERD,  hernia repair, coronary artery disease, glaucoma who  was brought in by his  son to the emergency department due to progressive weakness associated  with 4 falls in the last 9 days and confusion. CXR Patchy bibasilar  airspace opacities, right greater than left, raise concern for pneumonia  given the patient's symptoms. Small right pleural effusion noted. Head CT  negative. Found to have acute encephalopathy due to UTI.  No Data Recorded  Assessment / Plan / Recommendation CHL IP CLINICAL IMPRESSIONS 02/24/2015  Therapy Diagnosis Mild oral phase dysphagia;Moderate pharyngeal phase  dysphagia;Severe pharyngeal phase dysphagia  Clinical Impression Delayed oral transit and cohesion across  consistencies. Moderate-severe sensorimotor based pharyngeal dysphagia and  decreased sensation with delayed swallow initiation. Laryngeal penetration  and aspiration (silent) across all consistencies assessed during initial  swallow and in attempts to clear mod-max vallecular and moderate pyriform  sinus residue. Inadequate sensation to residue and unable to safety reduce  with max cues for multiple swallows. Pt at significant aspiration risk at  present wth clinical findings and current confusion, recommend NPO,  temporary means of nutrition and oral care. ST will follow.        CHL IP TREATMENT RECOMMENDATION 02/24/2015  Treatment Recommendations Therapy as outlined in treatment plan below     CHL IP DIET RECOMMENDATION 02/24/2015  SLP Diet Recommendations NPO;Alternative means - temporary  Liquid Administration via (None)  Medication Administration Via alternative means  Compensations (None)  Postural Changes and/or Swallow Maneuvers (None)     CHL IP OTHER RECOMMENDATIONS 02/24/2015  Recommended Consults (None)  Oral Care Recommendations Oral care QID  Other Recommendations (None)     No flowsheet data found.   CHL IP FREQUENCY AND DURATION 02/24/2015  Speech Therapy Frequency (ACUTE ONLY) min 2x/week  Treatment Duration 2 weeks     Pertinent Vitals/Pain none    SLP Swallow Goals No  flowsheet data found.  No flowsheet data found.    CHL IP REASON FOR REFERRAL 02/24/2015  Reason for Referral Objectively evaluate swallowing function               No flowsheet data found.         Royce Macadamia 02/24/2015, 2:13 PM   Breck Coons Lonell Face.Ed CCC-SLP Pager [phone]      Dg Hip Unilat  With Pelvis 2-3 Views Right  02/21/2015   CLINICAL DATA:  Multiple falls over the past 10 days. Right hip pain and bruising. History of prior right intertrochanteric fracture. Initial encounter.  EXAM: DG HIP (WITH OR WITHOUT PELVIS) 2-3V RIGHT  COMPARISON:  Single view of the abdomen 12/26/2014.  FINDINGS: A hip screw and long IM nail are in place for fixation of a healed right intertrochanteric fracture. No acute fracture is identified. No notable degenerative change is identified. No focal bony lesion is seen.  IMPRESSION: No acute abnormality. Healed right intertrochanteric fracture with fixation hardware in place.   Electronically Signed   By: Drusilla Kanner M.D.   On: 02/21/2015 15:45    Microbiology: No results found for this or any previous visit (from the past 240 hour(s)).   Labs: Basic Metabolic Panel:  Recent Labs Lab  02/26/15 1610 02/27/15 0653 02/28/15 0604  NA 132* 133* 135  K 3.5 3.4* 3.6  CL 97* 96* 97*  CO2 GLUCOSE 203* 234* 229*  BUN CREATININE 0.93 0.86 0.84  CALCIUM 8.5* 8.5* 8.9  MG  --  2.0  --    Liver Function Tests:  Recent Labs Lab 02/28/15 0604  AST 15  ALT 25  ALKPHOS 83  BILITOT 0.4  PROT 5.9*  ALBUMIN 2.7*   No results for input(s): LIPASE, AMYLASE in the last 168 hours. No results for input(s): AMMONIA in the last 168 hours. CBC:  Recent Labs Lab 02/26/15 0632 02/28/15 0604  WBC 9.7 8.1  HGB 11.8* 11.9*  HCT 35.1* 36.6*  MCV 90.0 91.5  PLT 316 317   Cardiac Enzymes: No results for input(s): CKTOTAL, CKMB, CKMBINDEX, TROPONINI in the last 168 hours. BNP: BNP (last 3 results) No results for input(s): BNP in  the last 8760 hours.  ProBNP (last 3 results) No results for input(s): PROBNP in the last 8760 hours.  CBG:  Recent Labs Lab 03/02/15 1635 03/02/15 2231 03/03/15 0841 03/03/15 1208 03/03/15 1630  GLUCAP 143* 144* 124* 162* 124*       Signed:  Azelia Reiger MD, PhD  [facility] 03/04/2015, 12:58 PM

## 2015-03-03 NOTE — Clinical Social Work Note (Signed)
CSW contacted [facility] [place] to follow up on the hospice referral. CSW spoke with Nehemiah Settle, the RN on call. Brooke informed the CSW that she will have to reevaulate the pt today for symptom management. Brooke reported that yesterday the pt did not meet criteria for [facility]IP hospice because he did not have symptoms to  management yesterday. Brooke informed the CSW that they did not have any resident hospice beds available. CSW will follow-up with Nehemiah Settle later today after she reevaulate the pt.     Azriella Mattia, MSW, LCSWA [phone]

## 2015-03-03 NOTE — Progress Notes (Signed)
Hospice nurse Kurt Navarro, into see family to assess for in house hospice care. According to guidelines pt not eligible for in house hospice care but is eligible for in home care with hospice services or facility with hospice services. Son has requested to see Dr. Roda Shutters and the palliative care nurse. Both have been notified and have agreed to come to see the son. Palliative care will send someone but they are at [facility] right now. Could be later today. Midge Aver, MSN RN [phone]

## 2015-03-03 NOTE — Progress Notes (Signed)
[facility]                                                                              Patient Demographics  Kurt Navarro, is a 81 y.o. male, DOB - 13-Jul-1926, ZOX:[id number]  Admit date - 02/21/2015   Admitting Physician Bobette Mo, MD  Outpatient Primary MD for the patient is Thayer Headings, MD  LOS - 10   Chief Complaint  Patient presents with  . Fall      HPI 02/21/2015 by Dr. Sanda Klein Kurt Navarro is a 81 y.o. male with a past medical history of type 2 diabetes, hypertension, hyperlipidemia, heart murmur, BPH, GERD, coronary artery disease, glaucoma who was brought in by his son to the emergency department due to progressive weakness associated with 4 falls in the last 9 days and confusion. Apparently the patient has not hit his head, he hasn't have fever or chills, but he is usually alert oriented 3 and has been unable to tell the current date even down to the year, as well as being confused with other events for the past week plus.He is he is oriented to place and is currently in no acute distress. He is unable to provide further details on his history.   Assessment & Plan   Acute encephalopathy secondary UTI/Pneumonia -CT head: No acute intracranial abnormality -CXR 8/26: Decreased lung volumes with increased bibasilar atelectasis, questionable small right pleural effusion, CXR 8/28: Has a bi-basilar airspace opacities, concerning for pneumonia, pneumonia likely secondary to aspiration. Aspiration pna likely a chronic condition, presented prior to admission. Strep pneumonia urine antigen negative, legionella urine antigen negative -UA: WBC TNTC, Large leukocytes , Urine culture >100K Enterococcus, finished treatment with IV vancomycin --finished treatment with zosyn for possible aspiration pna., patient and family aware of and accept the risk of recurrent aspiration pneumonia, patient wants to eat, after attempt meals patient likely aspirated again, showed  signs of continued decline,  9/3 family now is interested in hospice care, palliative team input appreicated -9/4patient continue to decline, awaiting hospice placement  Dysphagia ---has been npo since admission, has been on tube feeds through [facility], nutrition and speech input appreciated. -family and patient hanges his minds at times about peg tube placement initially, IR was consulted for peg tube placement initially, later family and patient decided no feeding tube. -family coordinated with speechtherapy for repeat swallow eval on 9/2,  -9/2 repeated swallow eval, showed risk of aspiration , family and patient are willing to take the risk of aspiration, now on puree diet and putting thick liquid, ng removed, appreciate palliative team intput, patient now DNR/DNI, no feeding tube, they agree with SNF placement and continue outpatient palliative care consult, patient and family has not decided whether to treat with abx if recurrent aspiration pneumonia. -9/3 family now thinking about hospice care 9/4 continued decline, awaiting for hospice placement.   Fall/physical deconditioning -family reported progressive weakness and multiple falls in the last month PT consulted and appreciated, recommended SNF -social work consulted for placement -9/3 SNF discharge cancelled, family want to discuss hospice care 9/5 continue to decline, awaiting hospice facility discharge  Diabetes mellitus, type 2 -Metformin held -Continue insulin sliding scale CBG  monitoring -a1c 6.4  Hyperlipidemia -Continue statin, d/c statin, patient now too weak to take meds  Essential hypertension -Continue Cozaar, lopressor  BPH -Continue Proscar, Flomax  Hypothyroidism -Continue Synthroid  Code Status: DNR/DNI  Family Communication:  Patient, daughter and son in room  Disposition Plan:   Hospice facility  Time Spent in minutes   25 minutes  Procedures  Modified Barium swallow  Consults     Palliative care  DVT Prophylaxis  Lovenox  Lab Results  Component Value Date   PLT 317 02/28/2015    Medications  Scheduled Meds: . enoxaparin (LOVENOX) injection  40 mg Subcutaneous Q24H  . insulin aspart  0-5 Units Subcutaneous QHS  . insulin aspart  0-9 Units Subcutaneous TID WC  . insulin glargine  10 Units Subcutaneous QHS  . latanoprost  1 drop Both Eyes QHS  . levothyroxine  50 mcg Per Tube QAC breakfast  . losartan  100 mg Per Tube Daily  . metoprolol tartrate  25 mg Oral BID  . multivitamin with minerals  1 tablet Per Tube Daily  . pantoprazole sodium  40 mg Per Tube Daily   Continuous Infusions: . feeding supplement (JEVITY 1.2 CAL) Stopped (02/28/15 0940)   PRN Meds:.  Antibiotics    Anti-infectives    Start     Dose/Rate Route Frequency Ordered Stop   03/01/15 0000  amoxicillin-clavulanate (AUGMENTIN) 875-125 MG per tablet     1 tablet Oral 2 times daily 03/01/15 1107     02/24/15 0600  piperacillin-tazobactam (ZOSYN) IVPB 3.375 g  Status:  Discontinued     3.375 g 12.5 mL/hr over 240 Minutes Intravenous Every 8 hours 02/24/15 0027 02/28/15 1223   02/24/15 0030  piperacillin-tazobactam (ZOSYN) IVPB 3.375 g     3.375 g 100 mL/hr over 30 Minutes Intravenous  Once 02/24/15 0027 02/24/15 0130   02/23/15 1700  vancomycin (VANCOCIN) 1,250 mg in sodium chloride 0.9 % 250 mL IVPB  Status:  Discontinued     1,250 mg 166.7 mL/hr over 90 Minutes Intravenous Every 24 hours 02/23/15 1556 02/27/15 1103   02/22/15 1630  cefTRIAXone (ROCEPHIN) 1 g in dextrose 5 % 50 mL IVPB  Status:  Discontinued     1 g 100 mL/hr over 30 Minutes Intravenous Every 24 hours 02/21/15 2054 02/23/15 1545   02/21/15 1630  cefTRIAXone (ROCEPHIN) 1 g in dextrose 5 % 50 mL IVPB     1 g 100 mL/hr over 30 Minutes Intravenous  Once 02/21/15 1626 02/21/15 1822      Subjective:   Kurt Navarro seen and examined today.  Continued decline, barely awake, drooling  Objective:   Filed Vitals:    03/02/15 0556 03/02/15 1311 03/02/15 2230 03/03/15 0510  BP: 155/58 151/91 139/70 123/72  Pulse: 82 92 76 77  Temp: 99.4 F (37.4 C)  97.6 F (36.4 C) 98.2 F (36.8 C)  TempSrc: Oral  Oral Oral  Resp: 18  18 18   Height:      Weight:      SpO2: 91%  94% 93%    Wt Readings from Last 3 Encounters:  03/01/15 132 lb 7.9 oz (60.1 kg)  12/12/14 146 lb (66.225 kg)  12/11/14 146 lb (66.225 kg)     Intake/Output Summary (Last 24 hours) at 03/03/15 1145 Last data filed at 03/02/15 2348  Gross per 24 hour  Intake      0 ml  Output    500 ml  Net   -500 ml  Exam  General: frail, elderly, chronically ill-appearing,   HEENT: NCAT, mucous membranes moist.   Cardiovascular: S1 S2 auscultated, RRR  Respiratory: Diminished breath sounds, Course at bases.  Abdomen: Soft, nontender, nondistended, + bowel sounds  Extremities: warm dry without cyanosis clubbing or edema  Neuro: barely awake, continued decline  Skin: bruising and ecchymosis noted on the extremities.  Data Review   Micro Results Recent Results (from the past 240 hour(s))  Urine culture     Status: None   Collection Time: 02/21/15  5:10 PM  Result Value Ref Range Status   Specimen Description URINE, CLEAN CATCH  Final   Special Requests Immunocompromised  Final   Culture >=100,000 COLONIES/mL ENTEROCOCCUS SPECIES  Final   Report Status 02/24/2015 FINAL  Final   Organism ID, Bacteria ENTEROCOCCUS SPECIES  Final      Susceptibility   Enterococcus species - MIC*    AMPICILLIN <=2 RESISTANT Resistant     LEVOFLOXACIN >=8 RESISTANT Resistant     NITROFURANTOIN <=16 SENSITIVE Sensitive     VANCOMYCIN 1 SENSITIVE Sensitive     * >=100,000 COLONIES/mL ENTEROCOCCUS SPECIES    Radiology Reports Dg Chest 2 View  02/21/2015   CLINICAL DATA:  Multiple falls yesterday, increased confusion, incontinence, decreased appetite, history hypertension, diabetes mellitus, coronary artery disease, former smoker  EXAM: CHEST  2  VIEW  COMPARISON:  02/13/2015  FINDINGS: Low lung volumes.  Upper normal heart size.  Atherosclerotic calcification aorta.  Pulmonary vascular congestion.  Bibasilar atelectasis, increased.  Apices clear.  Questionable small RIGHT pleural effusion.  No pneumothorax.  Bones demineralized.  IMPRESSION: Decreased lung volumes with increased bibasilar atelectasis and questionable small RIGHT pleural effusion.   Electronically Signed   By: Ulyses Southward M.D.   On: 02/21/2015 18:17   Dg Chest 2 View  02/05/2015   CLINICAL DATA:  Larey Seat. Week and soreness all over. Tenderness in the back and right shoulder area.  EXAM: CHEST  2 VIEW  COMPARISON:  04/28/2014  FINDINGS: There are old right rib fractures. Age-indeterminate fractures involving the left fifth and sixth ribs. Densities at the left lung base could represent atelectasis or scarring. Heart size is grossly normal with atherosclerotic calcifications at the aorta. Negative for a pneumothorax. Again noted is a compression fracture in the upper lumbar spine.  IMPRESSION: Low lung volumes bilaterally.  Bilateral rib fractures. Left rib fractures are age indeterminate and new from the prior examination.   Electronically Signed   By: Richarda Overlie M.D.   On: 02/05/2015 07:55   Dg Lumbar Spine Complete  02/05/2015   CLINICAL DATA:  Injury, fall in the bathroom  EXAM: LUMBAR SPINE - COMPLETE 4+ VIEW  COMPARISON:  01/03/2015  FINDINGS: Five views of the lumbar spine submitted. There is diffuse osteopenia. Atherosclerotic calcifications of abdominal aorta and iliac arteries. Again noted compression deformity upper endplate of L1 vertebral body is stable from prior exam. There is disc space flattening with mild anterior spurring at T12-L1 and L1-L2 level. Mild anterior spurring upper endplate of L3 and L4 vertebral body. Minimal disc space flattening at L5-S1 level. There is mild lower lumbar levoscoliosis.  IMPRESSION: No acute fracture or subluxation. Mild lower lumbar  levoscoliosis. Degenerative changes as described above. Stable compression fracture upper endplate of L1 vertebral body. Diffuse osteopenia.   Electronically Signed   By: Natasha Mead M.D.   On: 02/05/2015 08:05   Dg Shoulder Right  02/05/2015   CLINICAL DATA:  Larey Seat with weakness. Tenderness in the back  and right shoulder area.  EXAM: RIGHT SHOULDER - 2+ VIEW  COMPARISON:  04/28/2014  FINDINGS: There are old right rib fractures. The right shoulder is located without a fracture. Right AC joint is intact. Evidence for spurring and degenerative changes at the right glenohumeral joint.  IMPRESSION: No acute bone abnormality in the right shoulder.   Electronically Signed   By: Richarda Overlie M.D.   On: 02/05/2015 08:00   Ct Head Wo Contrast  02/21/2015   CLINICAL DATA:  Multiple falls with 2 yesterday. Confusion. Incontinence. Decreased appetite.  EXAM: CT HEAD WITHOUT CONTRAST  TECHNIQUE: Contiguous axial images were obtained from the base of the skull through the vertex without intravenous contrast.  COMPARISON:  None  FINDINGS: Sinuses/Soft tissues: Surgical changes about the globes. Mucosal thickening of the left maxillary sinus. No skull fracture. Clear mastoid air cells.  Intracranial: Expected cerebral atrophy. Moderate low density in the periventricular white matter likely related to small vessel disease. No mass lesion, hemorrhage, hydrocephalus, acute infarct, intra-axial, or extra-axial fluid collection.  IMPRESSION: 1.  No acute intracranial abnormality. 2.  Cerebral atrophy and small vessel ischemic change. 3. Mild sinus disease.   Electronically Signed   By: Jeronimo Greaves M.D.   On: 02/21/2015 17:56   Ct Head Wo Contrast  02/05/2015   CLINICAL DATA:  Fall.  Hit head.  EXAM: CT HEAD WITHOUT CONTRAST  TECHNIQUE: Contiguous axial images were obtained from the base of the skull through the vertex without intravenous contrast.  COMPARISON:  None.  FINDINGS: There is diffuse low attenuation throughout the  subcortical and periventricular white matter compatible with chronic microvascular disease. Prominence of the sulci and the ventricles noted consistent with brain atrophy. No abnormal extra-axial fluid collections, intracranial hemorrhage or mass noted. There is mild mucosal thickening involving the maxillary sinuses. The paranasal sinuses are clear. The mastoid air cells appear clear. The calvarium is intact.  IMPRESSION: 1. No acute intracranial abnormality. 2. Chronic microvascular disease and brain atrophy.   Electronically Signed   By: Signa Kell M.D.   On: 02/05/2015 08:26   Ct Chest W Contrast  02/05/2015   CLINICAL DATA:  Fall.  Right rib pain  EXAM: CT CHEST WITH CONTRAST  TECHNIQUE: Multidetector CT imaging of the chest was performed during intravenous contrast administration.  CONTRAST:  OMNIPAQUE IOHEXOL 300 MG/ML  SOLN  COMPARISON:  Chest x-ray 02/05/2015.  No prior chest CT for review  FINDINGS: Left upper lobe mass lesion measures 3.5 x 11.6 cm. This has spiculated margins and is worrisome for carcinoma. Additional spiculated mass in the superior segment left lower lobe adjacent to the major fissure measures approximately 23 x 13 mm. These lesions are difficult to see on chest x-ray due to their location. Both lesions are worrisome for carcinoma. No mediastinal adenopathy.  Chronic lung disease with probable interstitial scarring on the right.  Right lower lobe airspace disease may represent atelectasis or pneumonia. Small right effusion. Mild left lower lobe atelectasis.  Coronary artery calcification. Heart size normal. Atherosclerotic calcification aortic arch without aneurysm or dissection. No central pulmonary embolus.  Chronic right rib fractures with deformity. No acute right rib fracture. Left rib fractures appear subacute with callus formation. No acute left rib fracture.  Negative for mass in the upper abdomen.  Mild compression fracture at T11 of indeterminate age.  IMPRESSION:  Spiculated mass left upper lobe and superior segment left lower lobe. These findings are concerning for carcinoma of the lung. Pneumonia and fibrosis considered  less likely. PET-CT recommended for further evaluation.  Probable chronic lung disease with interstitial scarring on the right. Right lower lobe atelectasis/ infiltrate. Small right effusion. Pneumonia not excluded on the right.  Bilateral rib fractures appear subacute to chronic. No acute rib fracture.  Mild compression fracture T11, possibly acute.  These results were called by telephone at the time of interpretation on 02/05/2015 at 11:18 am to [facility] , who verbally acknowledged these results.   Electronically Signed   By: Marlan Palau M.D.   On: 02/05/2015 11:19   Ct Cervical Spine Wo Contrast  02/05/2015   CLINICAL DATA:  81 year old with recent fall. Head laceration. Complains of pain in the upper back.  EXAM: CT CERVICAL SPINE WITHOUT CONTRAST  TECHNIQUE: Multidetector CT imaging of the cervical spine was performed without intravenous contrast. Multiplanar CT image reconstructions were also generated.  COMPARISON:  01/25/2008  FINDINGS: Negative for an acute fracture dislocation in the cervical spine. There is mild sclerosis and deformity of the T3 superior endplate which appears new since 2009. There is mild retrolisthesis at C3-C4 with disc space narrowing. Bilateral facet arthropathy. No significant soft tissue edema. No significant neck lymphadenopathy.  Images of the upper lungs demonstrate a 2.7 x 1.1 cm opacity in the medial left upper lobe. This opacity contains some scattered calcifications. There are additional patchy opacities and calcifications in the right upper lung. There is also a small focal opacity in the medial superior segment of the left lower lobe measuring up to 1.5 cm.  IMPRESSION: Degenerative changes in cervical spine without acute bone abnormality.  Mild compression deformity of the T3 vertebral body of unknown  age. Legrand Rams an old injury based on the sclerosis but recommend clinical correlation.  There are focal parenchymal densities or lesions in the left upper lung, particularly in the left upper lobe. These appear to be new since 2009. Findings are nonspecific but a neoplastic process cannot be excluded. Recommend further characterization with a post contrast chest CT.  These results were called by telephone at the time of interpretation on 02/05/2015 at 8:42 am to [facility], PA-C, who verbally acknowledged these results.   Electronically Signed   By: Richarda Overlie M.D.   On: 02/05/2015 08:52   Mr Brain Wo Contrast  02/28/2015   CLINICAL DATA:  Altered mental status.  EXAM: MRI HEAD WITHOUT CONTRAST  TECHNIQUE: Multiplanar, multiecho pulse sequences of the brain and surrounding structures were obtained without intravenous contrast.  COMPARISON:  Head CT 02/21/2015  FINDINGS: A non expanded partially empty sella is incidentally noted. There is no evidence of acute infarct, intracranial hemorrhage, mass, midline shift, or extra-axial fluid collection. There is moderate generalized cerebral and cerebellar atrophy. Small, chronic cortical and subcortical infarcts are present in the posterior right frontal lobe and right parietal lobe. Subcortical and periventricular cerebral white matter T2 hyperintensities elsewhere are nonspecific but compatible with moderate chronic small vessel ischemic disease.  Prior bilateral cataract extraction is noted. Paranasal sinuses are clear. There is trace right mastoid fluid. Major intracranial vascular flow voids are preserved.  IMPRESSION: 1. No acute intracranial abnormality. 2. Moderate chronic small vessel ischemic disease and cerebral atrophy. 3. 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   Electronically Signed   By: Sebastian Ache M.D.   On: 02/28/2015 10:55   Dg Chest Port 1 View  02/24/2015   CLINICAL DATA:  Increased tracheal secretions, acute onset. Initial  encounter.  EXAM: PORTABLE CHEST - 1 VIEW  COMPARISON:  Chest radiograph performed 02/21/2015  FINDINGS: The lungs remain hypoexpanded. Patchy bibasilar airspace opacities, right greater than left, raise concern for pneumonia, given the patient's symptoms. A small right pleural effusion is noted. No pneumothorax is seen.  The cardiomediastinal silhouette remains normal in size. No acute osseous abnormalities are identified.  IMPRESSION: Lungs hypoexpanded. Patchy bibasilar airspace opacities, right greater than left, raise concern for pneumonia given the patient's symptoms. Small right pleural effusion noted.   Electronically Signed   By: Roanna Raider M.D.   On: 02/24/2015 00:01   Dg Abd Portable 1v  02/24/2015   CLINICAL DATA:  NG tube placement  EXAM: PORTABLE ABDOMEN - 1 VIEW  COMPARISON:  No recent similar comparison exam  FINDINGS: Feeding tube tip terminates over the expected location of the gastric cardia. Right greater than left basilar lung field opacification is partly visualized. Normal bowel gas pattern in visualized aspects with bowel retained oral contrast.  IMPRESSION: Feeding tube tip terminates over the expected location of the gastric cardia.   Electronically Signed   By: Christiana Pellant M.D.   On: 02/24/2015 19:44   Dg Swallowing Func-speech Pathology  02/28/2015    Objective Swallowing Evaluation:   Modified Barium Swallow Patient Details  Name: Grayton Lobo MRN: [id number] Date of Birth: [birth year]  Today's Date: 02/28/2015 Time: SLP Start Time (ACUTE ONLY): 1100-SLP Stop Time (ACUTE ONLY): 1137 SLP Time Calculation (min) (ACUTE ONLY): 37 min  Past Medical History:  Past Medical History  Diagnosis Date  . Diabetes mellitus without complication   . Hypertension   . Hypercholesteremia   . Heart murmur   . Frequent urination   . BPH (benign prostatic hyperplasia)   . GERD (gastroesophageal reflux disease)   . Coronary artery disease     medical management with aspirin  . Glaucoma    Past  Surgical History:  Past Surgical History  Procedure Laterality Date  . Leg surgery  5 years ago    Rod in right leg  . Back surgery  years ago    lower  . Hernia repair  years ago  . Cystoscopy with retrograde pyelogram, ureteroscopy and stent placement  Bilateral 12/12/2014    Procedure: CYSTO WITH BILATERAL RETROGRADE/BIOPSY WITH  FULGURATION/HYDRODISTENSION OF BLADDER;  Surgeon: Bjorn Pippin, MD;   Location: [facility];  Service: Urology;  Laterality: Bilateral;   HPI:  Other Pertinent Information: 81 y.o. male with a past medical history of  type 2 diabetes, hypertension, hyperlipidemia, heart murmur, BPH, GERD,  hernia repair, coronary artery disease, glaucoma who was brought in by his  son to the emergency department due to progressive weakness associated  with 4 falls in the last 9 days and confusion. CXR Patchy bibasilar  airspace opacities, right greater than left, raise concern for pneumonia  given the patient's symptoms. Small right pleural effusion noted. Head CT  negative. Found to have acute encephalopathy due to UTI. Repeat MBS for   No Data Recorded  Assessment / Plan / Recommendation CHL IP CLINICAL IMPRESSIONS 02/28/2015  Therapy Diagnosis Mild oral phase dysphagia;Moderate oral phase  dysphagia;Moderate cervical esophageal phase dysphagia;Severe cervical  esophageal phase dysphagia  Clinical Impression Pt exhibited minimally improved swallow function from  [facility] 8/29 with slight and flash penetration with puree although max  pyriform sinus and mod-max vallecular residue. Silent aspiration during  the swallow teaspoon honey thick  with therapeutic chin tuck ineffective  to prevent penetration/aspiration. Aspiration risk is high with any po's  at present, however Dys 1 (puree) texture  and pudding thick liquids  recommended. Continue ST at SNF with likely initiation of honey thick  liquids with known risks as pt's family have stated no PEG and they may  want comfort feeds/liquids. Recommend sit upright, small  bites, swallow  2-3 additional times after bites to clear residue and volitional throat  clear/cough after every several bites, full supervision and crush meds.        CHL IP TREATMENT RECOMMENDATION 02/28/2015  Treatment Recommendations Therapy as outlined in treatment plan below     CHL IP DIET RECOMMENDATION 02/28/2015  SLP Diet Recommendations Dysphagia 1 (Puree)  Liquid Administration via (None)  Medication Administration Crushed with puree  Compensations Slow rate;Small sips/bites;Multiple dry swallows after each  bite/sip;Hard cough after swallow;Clear throat intermittently  Postural Changes and/or Swallow Maneuvers (None)     CHL IP OTHER RECOMMENDATIONS 02/28/2015  Recommended Consults (None)  Oral Care Recommendations Oral care QID  Other Recommendations (None)     CHL IP FOLLOW UP RECOMMENDATIONS 02/26/2015  Follow up Recommendations Skilled Nursing facility     [facility] IP FREQUENCY AND DURATION 02/28/2015  Speech Therapy Frequency (ACUTE ONLY) min 2x/week  Treatment Duration 2 weeks     Pertinent Vitals/Pain none    SLP Swallow Goals No flowsheet data found.  No flowsheet data found.    CHL IP REASON FOR REFERRAL 02/28/2015  Reason for Referral Objectively evaluate swallowing function               No flowsheet data found.         Kurt Navarro 02/28/2015, 2:35 PM   Breck Coons Lonell Face.Ed CCC-SLP Pager [phone]    Dg Swallowing Func-speech Pathology  02/24/2015    Objective Swallowing Evaluation:   Modified Barium Swallow Patient Details  Name: Kurt Navarro MRN: [id number] Date of Birth: 09/20/1926  Today's Date: 02/24/2015 Time: SLP Start Time (ACUTE ONLY): 1305-SLP Stop Time (ACUTE ONLY): 1328 SLP Time Calculation (min) (ACUTE ONLY): 23 min  Past Medical History:  Past Medical History  Diagnosis Date  . Diabetes mellitus without complication   . Hypertension   . Hypercholesteremia   . Heart murmur   . Frequent urination   . BPH (benign prostatic hyperplasia)   . GERD (gastroesophageal reflux disease)   .  Coronary artery disease     medical management with aspirin  . Glaucoma    Past Surgical History:  Past Surgical History  Procedure Laterality Date  . Leg surgery  5 years ago    Rod in right leg  . Back surgery  years ago    lower  . Hernia repair  years ago  . Cystoscopy with retrograde pyelogram, ureteroscopy and stent placement  Bilateral 12/12/2014    Procedure: CYSTO WITH BILATERAL RETROGRADE/BIOPSY WITH  FULGURATION/HYDRODISTENSION OF BLADDER;  Surgeon: Bjorn Pippin, MD;   Location: [facility];  Service: Urology;  Laterality: Bilateral;   HPI:  Other Pertinent Information: 81 y.o. male with a past medical history of  type 2 diabetes, hypertension, hyperlipidemia, heart murmur, BPH, GERD,  hernia repair, coronary artery disease, glaucoma who was brought in by his  son to the emergency department due to progressive weakness associated  with 4 falls in the last 9 days and confusion. CXR Patchy bibasilar  airspace opacities, right greater than left, raise concern for pneumonia  given the patient's symptoms. Small right pleural effusion noted. Head CT  negative. Found to have acute encephalopathy due to UTI.  No Data Recorded  Assessment / Plan / Recommendation  CHL IP CLINICAL IMPRESSIONS 02/24/2015  Therapy Diagnosis Mild oral phase dysphagia;Moderate pharyngeal phase  dysphagia;Severe pharyngeal phase dysphagia  Clinical Impression Delayed oral transit and cohesion across  consistencies. Moderate-severe sensorimotor based pharyngeal dysphagia and  decreased sensation with delayed swallow initiation. Laryngeal penetration  and aspiration (silent) across all consistencies assessed during initial  swallow and in attempts to clear mod-max vallecular and moderate pyriform  sinus residue. Inadequate sensation to residue and unable to safety reduce  with max cues for multiple swallows. Pt at significant aspiration risk at  present wth clinical findings and current confusion, recommend NPO,  temporary means of nutrition and  oral care. ST will follow.        CHL IP TREATMENT RECOMMENDATION 02/24/2015  Treatment Recommendations Therapy as outlined in treatment plan below     CHL IP DIET RECOMMENDATION 02/24/2015  SLP Diet Recommendations NPO;Alternative means - temporary  Liquid Administration via (None)  Medication Administration Via alternative means  Compensations (None)  Postural Changes and/or Swallow Maneuvers (None)     CHL IP OTHER RECOMMENDATIONS 02/24/2015  Recommended Consults (None)  Oral Care Recommendations Oral care QID  Other Recommendations (None)     No flowsheet data found.   CHL IP FREQUENCY AND DURATION 02/24/2015  Speech Therapy Frequency (ACUTE ONLY) min 2x/week  Treatment Duration 2 weeks     Pertinent Vitals/Pain none    SLP Swallow Goals No flowsheet data found.  No flowsheet data found.    CHL IP REASON FOR REFERRAL 02/24/2015  Reason for Referral Objectively evaluate swallowing function               No flowsheet data found.         Kurt Navarro 02/24/2015, 2:13 PM   Breck Coons Lonell Face.Ed CCC-SLP Pager [phone]      Dg Hip Unilat  With Pelvis 2-3 Views Right  02/21/2015   CLINICAL DATA:  Multiple falls over the past 10 days. Right hip pain and bruising. History of prior right intertrochanteric fracture. Initial encounter.  EXAM: DG HIP (WITH OR WITHOUT PELVIS) 2-3V RIGHT  COMPARISON:  Single view of the abdomen 12/26/2014.  FINDINGS: A hip screw and long IM nail are in place for fixation of a healed right intertrochanteric fracture. No acute fracture is identified. No notable degenerative change is identified. No focal bony lesion is seen.  IMPRESSION: No acute abnormality. Healed right intertrochanteric fracture with fixation hardware in place.   Electronically Signed   By: Drusilla Kanner M.D.   On: 02/21/2015 15:45    CBC  Recent Labs Lab 02/26/15 0632 02/28/15 0604  WBC 9.7 8.1  HGB 11.8* 11.9*  HCT 35.1* 36.6*  PLT 316 317  MCV 90.0 91.5  MCH 30.3 29.8  MCHC 33.6 32.5  RDW 14.6  14.4    Chemistries   Recent Labs Lab 02/25/15 0720 02/26/15 0632 02/27/15 0653 02/28/15 0604  NA 130* 132* 133* 135  K 3.9 3.5 3.4* 3.6  CL 97* 97* 96* 97*  CO2 24 26 29 31   GLUCOSE 121* 203* 234* 229*  BUN 10 13 13 14   CREATININE 0.86 0.93 0.86 0.84  CALCIUM 8.8* 8.5* 8.5* 8.9  MG  --   --  2.0  --   AST  --   --   --  15  ALT  --   --   --  25  ALKPHOS  --   --   --  83  BILITOT  --   --   --  0.4   ------------------------------------------------------------------------------------------------------------------ estimated creatinine clearance is 53.7 mL/min (by C-G formula based on Cr of 0.84). ------------------------------------------------------------------------------------------------------------------ No results for input(s): HGBA1C in the last 72 hours. ------------------------------------------------------------------------------------------------------------------ No results for input(s): CHOL, HDL, LDLCALC, TRIG, CHOLHDL, LDLDIRECT in the last 72 hours. ------------------------------------------------------------------------------------------------------------------ No results for input(s): TSH, T4TOTAL, T3FREE, THYROIDAB in the last 72 hours.  Invalid input(s): FREET3 ------------------------------------------------------------------------------------------------------------------ No results for input(s): VITAMINB12, FOLATE, FERRITIN, TIBC, IRON, RETICCTPCT in the last 72 hours.  Coagulation profile  Recent Labs Lab 02/28/15 0604  INR 1.06    No results for input(s): DDIMER in the last 72 hours.  Cardiac Enzymes No results for input(s): CKMB, TROPONINI, MYOGLOBIN in the last 168 hours.  Invalid input(s): CK ------------------------------------------------------------------------------------------------------------------ Invalid input(s): POCBNP    Ilyanna Baillargeon MD PhD. on 03/03/2015 at 11:45 AM  Between 7am to 7pm - Pager - [phone]  After 7pm go  to [email or web address] - password [email or web address]  And look for the night coverage person covering for me after hours  [facility] Office  [phone]

## 2015-03-03 NOTE — Care Management Important Message (Signed)
Important Message  Patient Details  Name: Finnean Cerami MRN: [id number] Date of Birth: 1927/01/03   Medicare Important Message Given:  Yes-fourth notification given    Bernadette Hoit 03/03/2015, 9:08 AM

## 2015-03-03 NOTE — Progress Notes (Signed)
Daily Progress Note   Patient Name: Kurt Navarro       Date: 03/03/2015 DOB: 1927-02-27  Age: 81 y.o. MRN#: [id number] Attending Physician: Albertine Grates, MD Primary Care Physician: Thayer Headings, MD Admit Date: 02/21/2015  Reason for Consultation/Follow-up: Inpatient hospice referral, Non pain symptom management and Psychosocial/spiritual support  Subjective:  Patient is  Weak, voice is barely audible.  Wife at bedside and he is taking small bites of grits/comfort feeds, cough is present, audible throat secretions, wet sounding.  I was called to bedside to talk with family about disposition.  He was not offered a bed today at [facility], he is not considered for GIP status, his medical records show no symptom management needs  However patient is unable to verbalize needs at this time, he has chronic back pain and was utilizing Oxycodone at home, will schedule medication for chronic pain, family apprecaitive   .Interval Events: Weaker more congested Length of Stay: 10 days  Current Medications: Scheduled Meds:  . enoxaparin (LOVENOX) injection  40 mg Subcutaneous Q24H  . insulin aspart  0-5 Units Subcutaneous QHS  . insulin aspart  0-9 Units Subcutaneous TID WC  . insulin glargine  10 Units Subcutaneous QHS  . latanoprost  1 drop Both Eyes QHS  . levothyroxine  50 mcg Per Tube QAC breakfast  . losartan  100 mg Per Tube Daily  . metoprolol tartrate  25 mg Oral BID  . multivitamin with minerals  1 tablet Per Tube Daily  . pantoprazole sodium  40 mg Per Tube Daily    Continuous Infusions: . feeding supplement (JEVITY 1.2 CAL) Stopped (02/28/15 0940)    PRN Meds: glycopyrrolate, LORazepam, morphine CONCENTRATE, RESOURCE THICKENUP CLEAR  Palliative Performance Scale: 20%      Vital Signs: BP 123/72 mmHg  Pulse 77  Temp(Src) 98.2 F (36.8 C) (Oral)  Resp 18  Ht 5\' 5"  (1.651 m)  Wt 60.1 kg (132 lb 7.9 oz)  BMI 22.05 kg/m2  SpO2 93% SpO2: SpO2: 93 % O2 Device: O2  Device: Not Delivered O2 Flow Rate:    Intake/output summary:   Intake/Output Summary (Last 24 hours) at 03/03/15 1108 Last data filed at 03/02/15 2348  Gross per 24 hour  Intake      0 ml  Output    650 ml  Net   -650 ml   LBM:   Baseline Weight: Weight: 65.4 kg (144 lb 2.9 oz) Most recent weight: Weight: 60.1 kg (132 lb 7.9 oz)  Physical Exam:  Gen.: Frail elderly man, weak, unable to reposition himself in bed Respiratory: decreased in bases, audible throat secretions Cardiac: RRR  Abd: soft NT +BS            Additional Data Reviewed: No results for input(s): WBC, HGB, PLT, NA, BUN, CREATININE, ALB in the last 72 hours.   Problem List:  Patient Active Problem List   Diagnosis Date Noted  . Dysphagia   . Palliative care encounter   . Increased tracheal secretions   . Dysphagia, pharyngoesophageal phase   . FTT (failure to thrive) in adult   . Altered mental status 02/21/2015  . Pressure ulcer 02/21/2015  . Hyperlipidemia 02/21/2015  . Essential hypertension 02/21/2015  . BPH (benign prostatic hypertrophy) 02/21/2015  . Diabetes mellitus, type 2 02/21/2015     Palliative Care Assessment & Plan    Code Status:  DNR  Goals of Care:  Comfort and dignity at end-of-life, family is hopeful for hospice facility, will request hospice  to re-evaluate in the morning.  Clearly his prognosis is less than two week.  Symptom Management:  Secretions/unable to clear- IV Robinul 0.1 every 8 hours as needed  Pain/chronic back/too weak to verbalize needs: Roxanol 5 mg po/sl  Every 6 hrs scheduled/ and every 4 hrs prn  Anxiety: Ativan 0.5 mg po/sl every 4 hrs prn   Psycho-social/Spiritual:  Desire for further Chaplaincy support:no   Prognosis: < 2 weeks Discharge Planning: Hospice facility   Care plan was discussed with  Debbe Bales RN from hosp[facility] of [place] and Dr Roda Shutters  Thank you for allowing the Palliative Medicine Team to assist in the care of this  patient.   Time In: 1345 Time Out: 1430 Total Time 45 min Prolonged Time Billed  no     Greater than 50%  of this time was spent counseling and coordinating care related to the above assessment and plan.   Canary Brim, NP  03/03/2015, 11:08 AM  Please contact Palliative Medicine Team phone at [phone] for questions and concerns.

## 2015-03-03 NOTE — Progress Notes (Signed)
Comfort care measures only. All meds dc'd. Morphine q6hr given at 13:00 and 18:30. Wife very upset. Pt will not eat. Other family members due to return shortly.   Midge Aver, RN

## 2015-03-03 NOTE — Care Management Note (Signed)
Case Management Note  Patient Details  Name: Eliel Dudding MRN: [id number] Date of Birth: [birth year]  Subjective/Objective:                 Patient admitted with AMS, palliative care consulted, dispo to inpatient hospice today.   Action/Plan:   Expected Discharge Date:                  Expected Discharge Plan:  Hospice Medical Facility  In-House Referral:  Clinical Social Work  Discharge planning Services  CM Consult  Post Acute Care Choice:    Choice offered to:     DME Arranged:    DME Agency:     HH Arranged:    HH Agency:     Status of Service:  Completed, signed off  Medicare Important Message Given:  Yes-fourth notification given Date Medicare IM Given:    Medicare IM give by:    Date Additional Medicare IM Given:    Additional Medicare Important Message give by:     If discussed at Long Length of Stay Meetings, dates discussed:    Additional Comments:  Lawerance Sabal, RN 03/03/2015, 9:51 AM

## 2015-03-03 NOTE — Clinical Social Work Note (Addendum)
CSW spoke with Arco from [facility] of [place], [address] According to [place] per guidelines the pt is not eligible for in house hospice care, but is eligible for in home care with hospice services or SNF with hospice services. Pt does not have Medicaid, so if the pt goes to SNF the family will be responible for paying the room and broad. CSW will discuss this matter with MD and the pt's family.   Addendum: CSW joined the family and Corrie Dandy the Palliative Care RN to dicussed symptom management and discharge plan. CSW informed that [facility]le will reevaluate the pt again tomorrow. CSW explained SNF with hospice services and the financial cost.  Gwynne Edinger, MSW, Theresia Majors [phone]

## 2015-03-04 MED ORDER — SCOPOLAMINE 1 MG/3DAYS TD PT72
1.0000 | MEDICATED_PATCH | TRANSDERMAL | Status: DC
  Administered 2015-03-04: 1.5 mg via TRANSDERMAL
  Filled 2015-03-04: qty 1

## 2015-03-04 MED ORDER — SCOPOLAMINE 1 MG/3DAYS TD PT72: 1.0000 | MEDICATED_PATCH | TRANSDERMAL | Status: AC

## 2015-03-04 NOTE — Progress Notes (Signed)
Nutrition Brief Note  Chart reviewed. Pt now transitioning to comfort care.  No further nutrition interventions warranted at this time.  Please re-consult as needed.   Alireza Pollack A. Brendan Gadson, RD, LDN, CDE Pager: [phone] After hours Pager: [phone]  

## 2015-03-04 NOTE — Progress Notes (Signed)
Daily Progress Note   Patient Name: Kurt Navarro       Date: 03/04/2015 DOB: April 13, 1927  Age: 81 y.o. MRN#: [id number] Attending Physician: Albertine Grates, MD Primary Care Physician: Thayer Headings, MD Admit Date: 02/21/2015  Reason for Consultation/Follow-up: Inpatient hospice referral, Non pain symptom management and Psychosocial/spiritual support  Subjective:  Patient is weaker today, nonverbal.   I was called to bedside to talk with family about disposition.  He was not offered a bed today at [facility], he is not considered for GIP status, his medical records show no symptom management needs  However patient is unable to verbalize needs at this time, he has chronic back pain and was utilizing Oxycodone at home, will schedule medication for chronic pain, family apprecaitive   .Interval Events: Weaker more congested Length of Stay: 11 days  Current Medications: Scheduled Meds:  . latanoprost  1 drop Both Eyes QHS  . morphine CONCENTRATE  5 mg Oral Q6H    Continuous Infusions:    PRN Meds: glycopyrrolate, LORazepam, morphine CONCENTRATE, morphine CONCENTRATE, RESOURCE THICKENUP CLEAR  Palliative Performance Scale: 20%      Vital Signs: BP 108/48 mmHg  Pulse 101  Temp(Src) 99.9 F (37.7 C) (Oral)  Resp 20  Ht  (1.651 m)  Wt 60.1 kg (132 lb 7.9 oz)  BMI 22.05 kg/m2  SpO2 96% SpO2: SpO2: 96 % O2 Device: O2 Device: Nasal Cannula O2 Flow Rate: O2 Flow Rate (L/min): 4 L/min  Intake/output summary:   Intake/Output Summary (Last 24 hours) at 03/04/15 0749 Last data filed at 03/04/15 0700  Gross per 24 hour  Intake      0 ml  Output    400 ml  Net   -400 ml   LBM:   Baseline Weight: Weight: 65.4 kg (144 lb 2.9 oz) Most recent weight: Weight: 60.1 kg (132 lb 7.9 oz)  Physical Exam:  Gen.: Frail elderly man, weak, unable to reposition himself in bed Respiratory: decreased in bases, audible throat secretions Cardiac: tachycardic  Abd: soft NT +BS              Additional Data Reviewed: No results for input(s): WBC, HGB, PLT, NA, BUN, CREATININE, ALB in the last 72 hours.   Problem List:  Patient Active Problem List   Diagnosis Date Noted  . Back pain 03/03/2015  . Dysphagia   . Palliative care encounter   . Increased tracheal secretions   . Dysphagia, pharyngoesophageal phase   . FTT (failure to thrive) in adult   . Altered mental status 02/21/2015  . Pressure ulcer 02/21/2015  . Hyperlipidemia 02/21/2015  . Essential hypertension 02/21/2015  . BPH (benign prostatic hypertrophy) 02/21/2015  . Diabetes mellitus, type 2 02/21/2015     Palliative Care Assessment & Plan    Code Status:  DNR  Goals of Care:  Comfort and dignity at end-of-life, family is hopeful for hospice facility,  hospice to re-evaluate this morning.  Clearly his prognosis is less than two week.  Symptom Management:  Secretions/unable to clear- IV Robinul 0.1 every 8 hours as needed  Pain/chronic back/too weak to verbalize needs: Roxanol 5 mg po/sl  Every 6 hrs scheduled/ and every 4 hrs prn  Anxiety: Ativan 0.5 mg po/sl every 4 hrs prn   Psycho-social/Spiritual:  Desire for further Chaplaincy support:no   Prognosis: < 2 weeks Discharge Planning: Hospice facility   Care plan was discussed with  Dr Roda Shutters  Thank you for allowing the Palliative Medicine Team to assist  in the care of this patient.   Time In: 0715 Time Out: 0750 Total Time 35 min Prolonged Time Billed  no     Greater than 50%  of this time was spent counseling and coordinating care related to the above assessment and plan.   Canary Brim, NP  03/04/2015, 7:49 AM  Please contact Palliative Medicine Team phone at [phone] for questions and concerns.

## 2015-03-04 NOTE — Progress Notes (Signed)
At 2030 pt was given robinul prn for increased secretions. 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 He received morphine  for discomfort and labored breathing. 2L [place] was applied d/t O2 level being 87% on RA.

## 2015-03-04 NOTE — Progress Notes (Signed)
Speech Language Pathology Discharge Patient Details Name: Akaash Vandewater MRN: [id number] DOB: 10-21-1926 Today's Date: 03/04/2015 Time:  -     Patient discharged from SLP services secondary to SLP checked on pt (spoke with family) who is basically not responsive and transferring to SNF with hospice today. .  Please see latest therapy progress note for current level of functioning and progress toward goals.    Progress and discharge plan discussed with patient and/or caregiver:Discussed with family  GO     Royce Macadamia 03/04/2015, 1:47 PM   Breck Coons Lonell Face.Ed [employer] [phone]

## 2015-03-04 NOTE — Clinical Social Work Note (Signed)
Per MD patient ready for DC to [facility]. RN, patient, patient's family Kathlene November and wife), and facility notified of DC. RN given number for report. DC packet on chart. Ambulance transport requested for patient for 1:30PM. CSW signing off.    Roddie Mc MSW, [place], May, [phone]

## 2015-03-04 NOTE — Progress Notes (Signed)
Patient discharging to Hospice care in [place], Report called to recieveing Rn Jackquline. IV dc'd. Foley remains, draining to gravity.  03/04/2015 1:13 PM Jaking Thayer

## 2015-03-29 DEATH — deceased

## 2015-04-10 ENCOUNTER — Other Ambulatory Visit: Payer: Self-pay | Admitting: Internal Medicine

## 2015-04-10 DIAGNOSIS — R911 Solitary pulmonary nodule: Secondary | ICD-10-CM

## 2015-04-14 ENCOUNTER — Other Ambulatory Visit: Payer: Self-pay | Admitting: Internal Medicine

## 2015-04-14 DIAGNOSIS — R911 Solitary pulmonary nodule: Secondary | ICD-10-CM

## 2016-12-10 IMAGING — CT CT CHEST W/ CM
1 of 4 series · 4 of 36 positions shown, 5 images · IV contrast (omnipaque)
Comparison: Chest x-ray 02/05/2015.  No prior chest CT for review

CLINICAL DATA: Fall.  Right rib pain

EXAM:
CT CHEST WITH CONTRAST
TECHNIQUE: Multidetector CT imaging of the chest was performed during
intravenous contrast administration.
CONTRAST:  100mL OMNIPAQUE IOHEXOL 300 MG/ML  SOLN

[Series 205: cor · coronal · 0.45mm/px · 4 of 78 slices shown, 5 images]
[im 16/78  mediastinal]
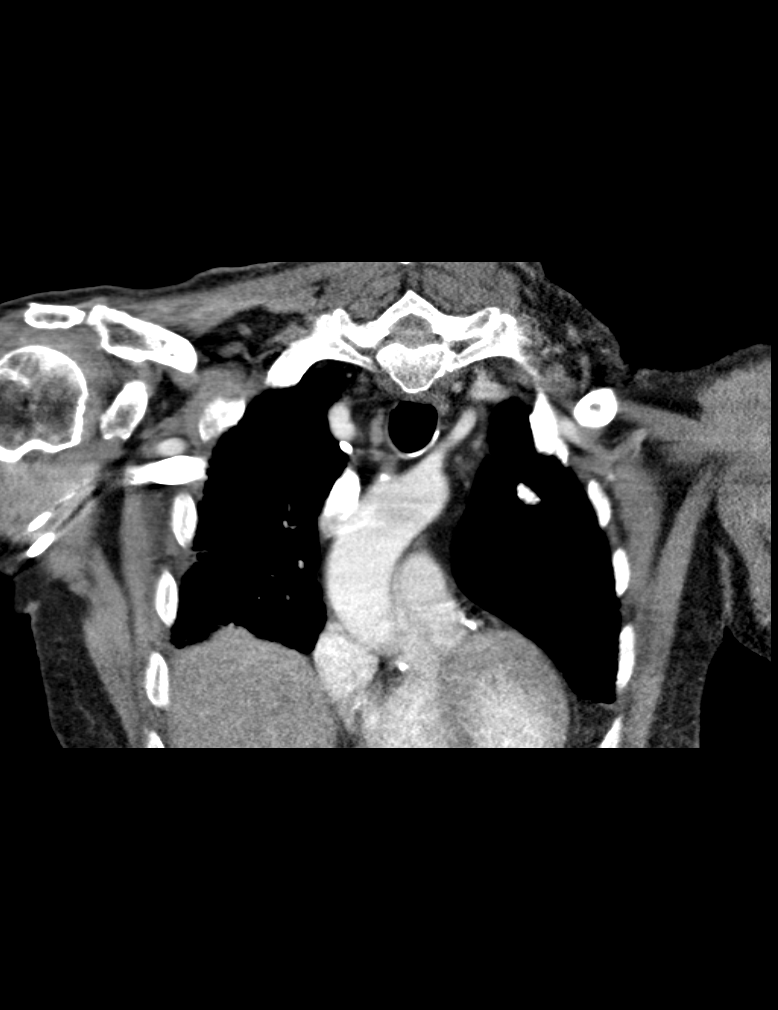
[im 16/78  lung]
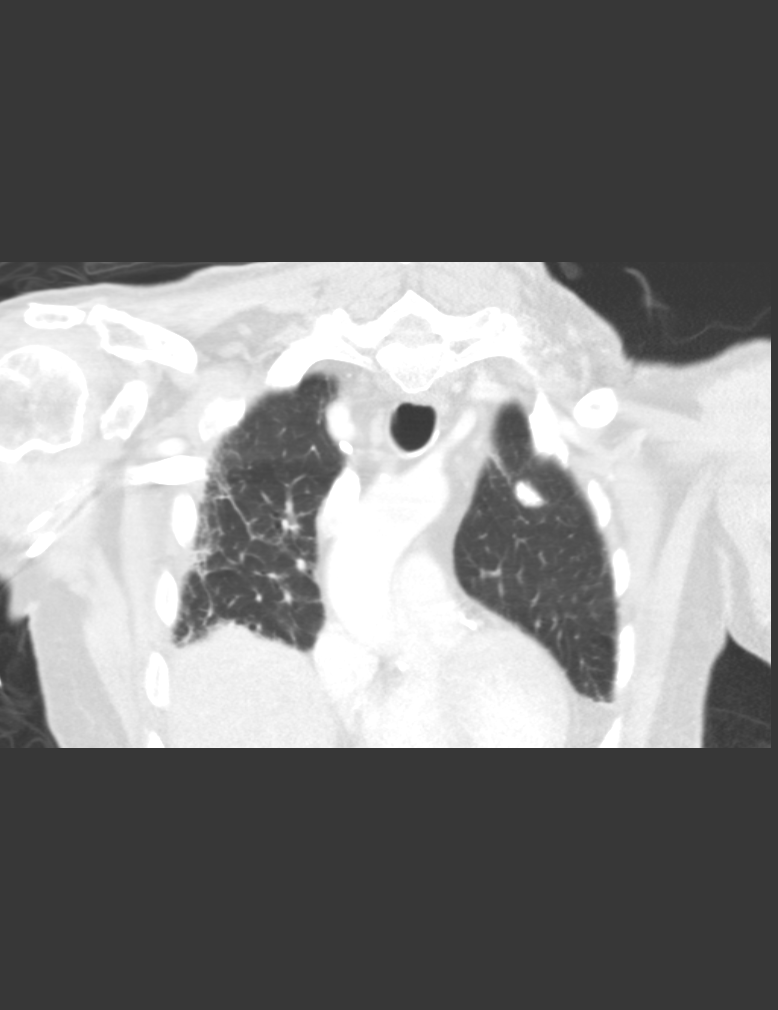
[im 31/78  lung]
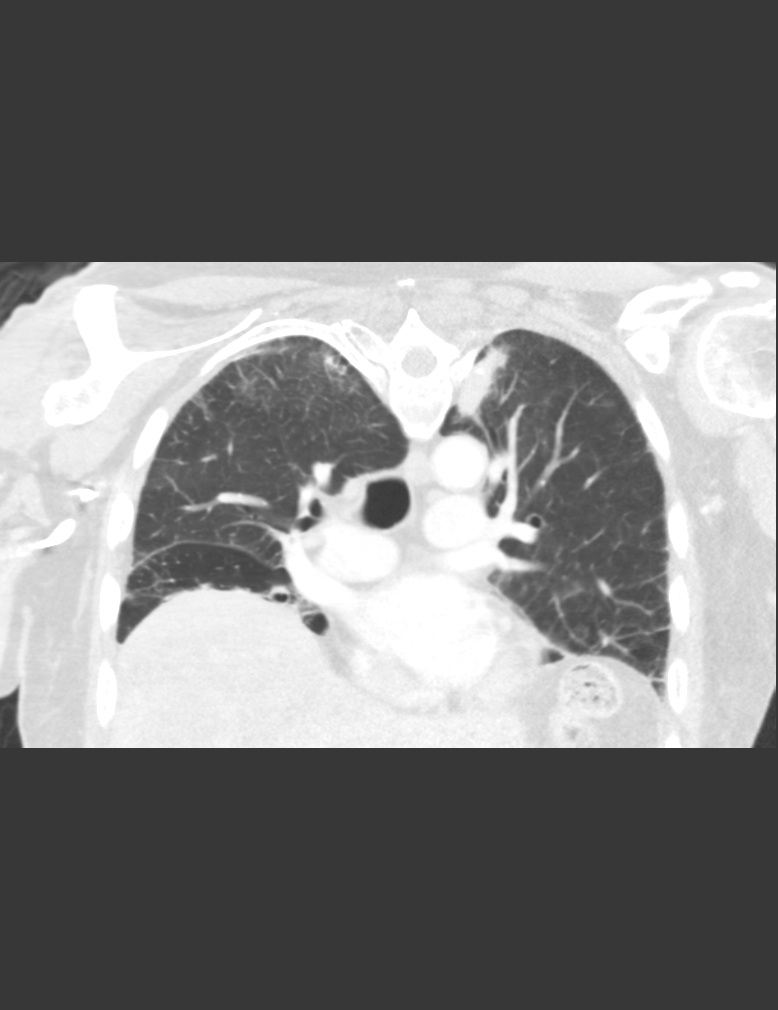
[im 47/78  lung]
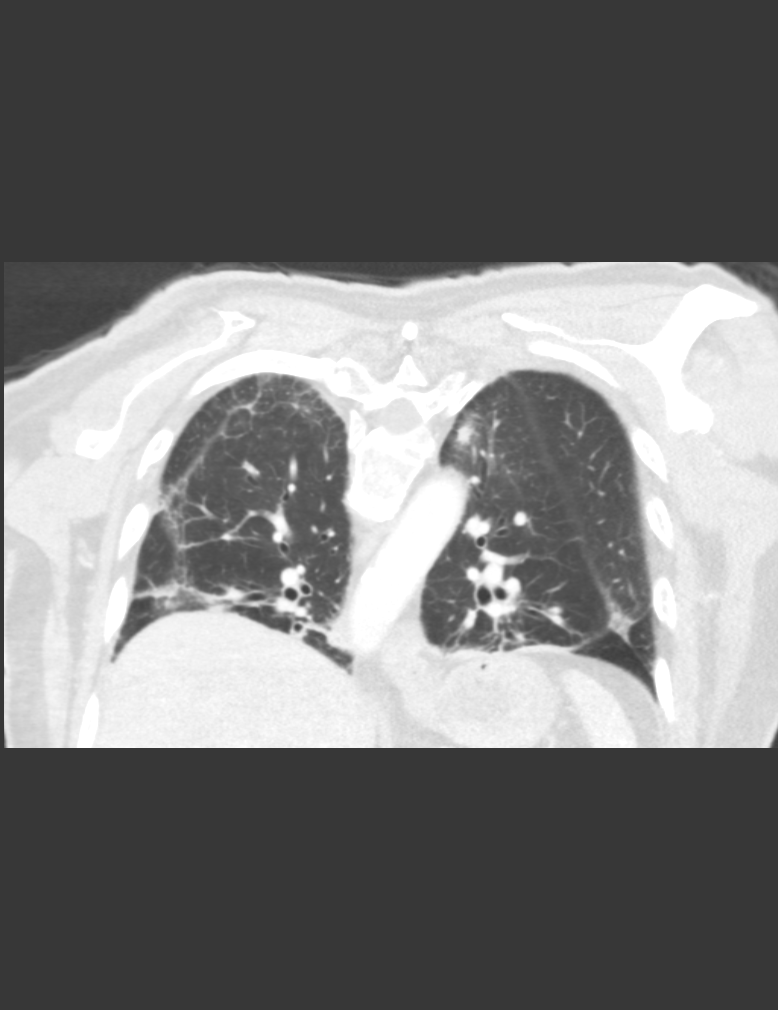
[im 62/78  lung]
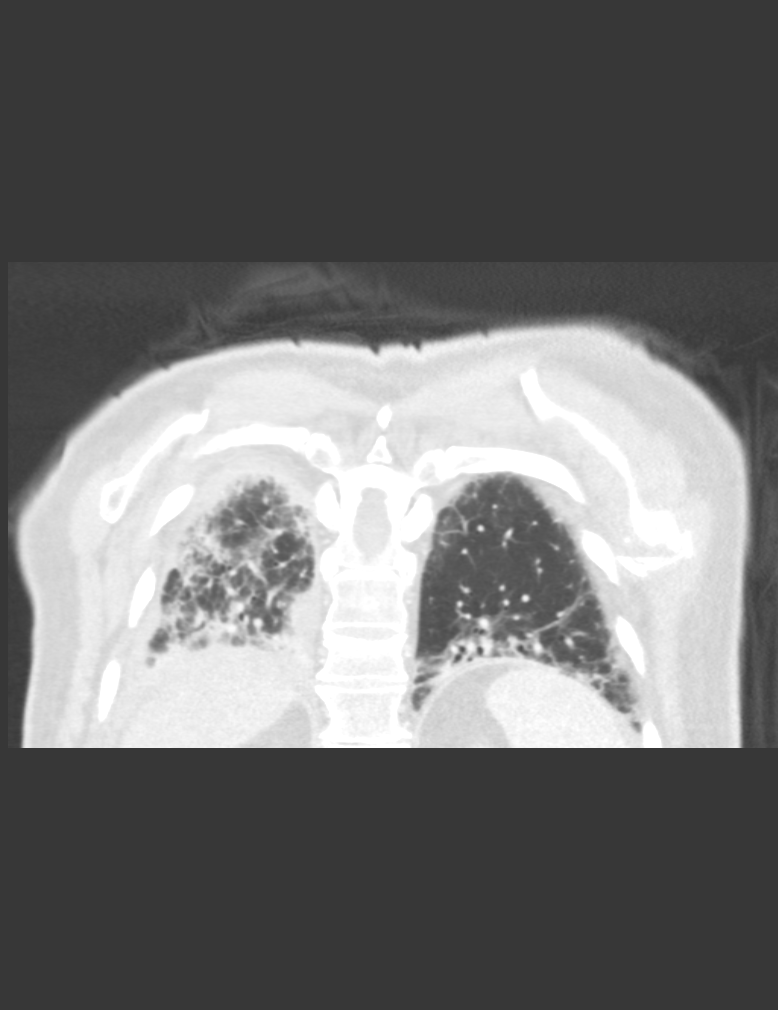

[4 of 36 positions shown; findings below may reference images not displayed]

FINDINGS: Left upper lobe mass lesion measures 3.5 x 11.6 cm. This has
spiculated margins and is worrisome for carcinoma. Additional
spiculated mass in the superior segment left lower lobe adjacent to
the major fissure measures approximately 23 x 13 mm. These lesions
are difficult to see on chest x-ray due to their location. Both
lesions are worrisome for carcinoma. No mediastinal adenopathy.

Chronic lung disease with probable interstitial scarring on the
right.

Right lower lobe airspace disease may represent atelectasis or
pneumonia. Small right effusion. Mild left lower lobe atelectasis.

Coronary artery calcification. Heart size normal. Atherosclerotic
calcification aortic arch without aneurysm or dissection. No central
pulmonary embolus.

Chronic right rib fractures with deformity. No acute right rib
fracture. Left rib fractures appear subacute with callus formation.
No acute left rib fracture.

Negative for mass in the upper abdomen.

Mild compression fracture at T11 of indeterminate age.
IMPRESSION: Spiculated mass left upper lobe and superior segment left lower
lobe. These findings are concerning for carcinoma of the lung.
Pneumonia and fibrosis considered less likely. PET-CT recommended
for further evaluation.

Probable chronic lung disease with interstitial scarring on the
right. Right lower lobe atelectasis/ infiltrate. Small right
effusion. Pneumonia not excluded on the right.

Bilateral rib fractures appear subacute to chronic. No acute rib
fracture.

Mild compression fracture T11, possibly acute.

These results were called by telephone at the time of interpretation
acknowledged these results.

## 2016-12-10 IMAGING — CT CT HEAD W/O CM
1 series · 16 of 30 positions shown, 20 images · non-contrast
Comparison: None.

CLINICAL DATA: Fall.  Hit head.

EXAM:
CT HEAD WITHOUT CONTRAST
TECHNIQUE: Contiguous axial images were obtained from the base of the skull
through the vertex without intravenous contrast.

[Series 3: head 5.0 h30s · axial · 0.46mm/px · z∈[-87,+53]mm · 16 of 32 slices shown, 20 images]
[im 2/32  brain]
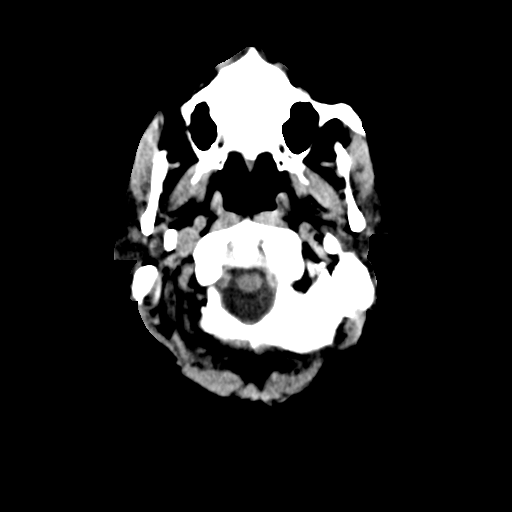
[im 2/32  bone]
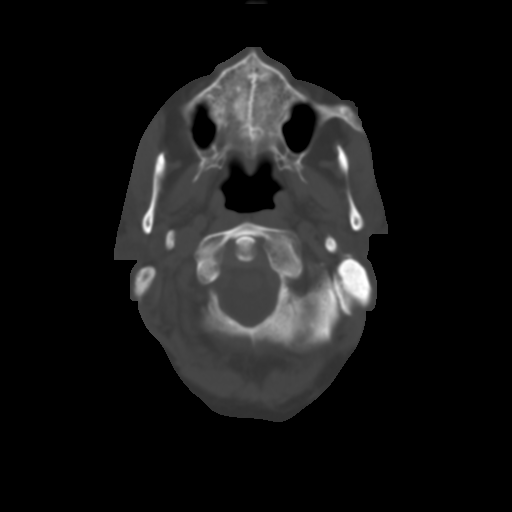
[im 4/32  brain]
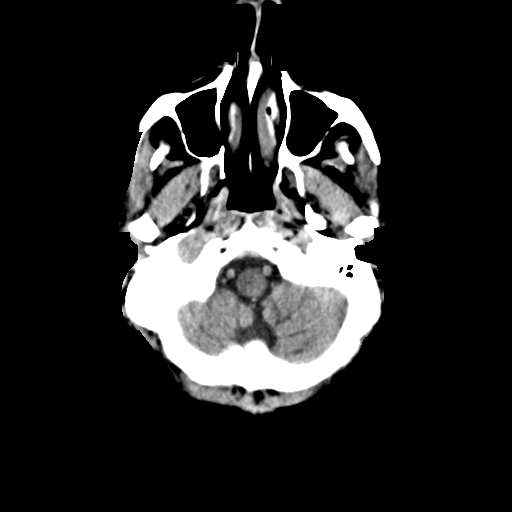
[im 6/32  brain]
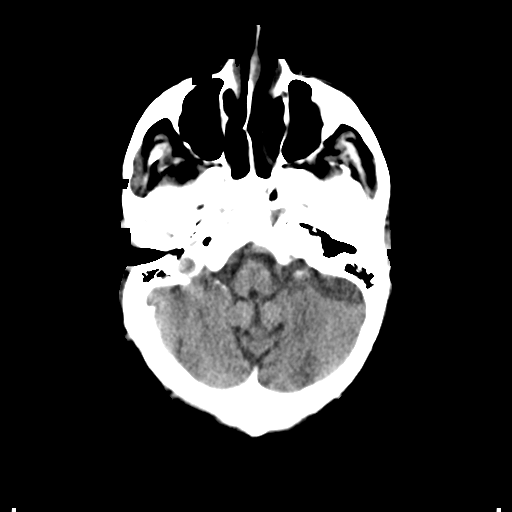
[im 8/32  brain]
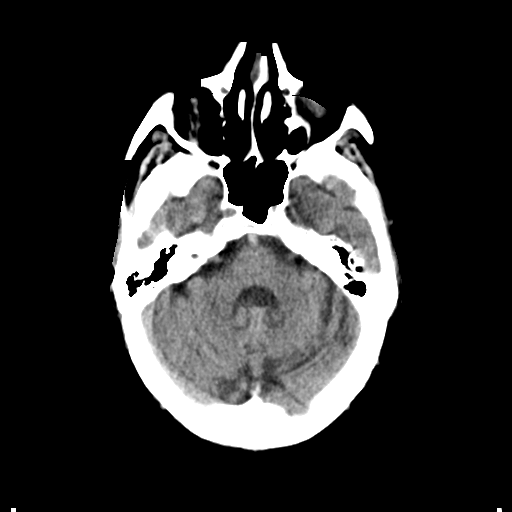
[im 9/32  brain]
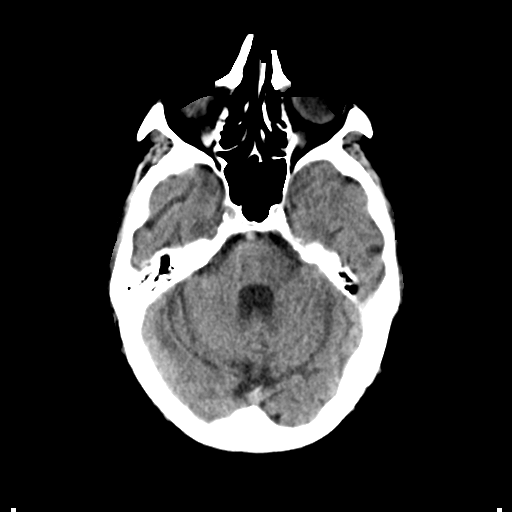
[im 9/32  bone]
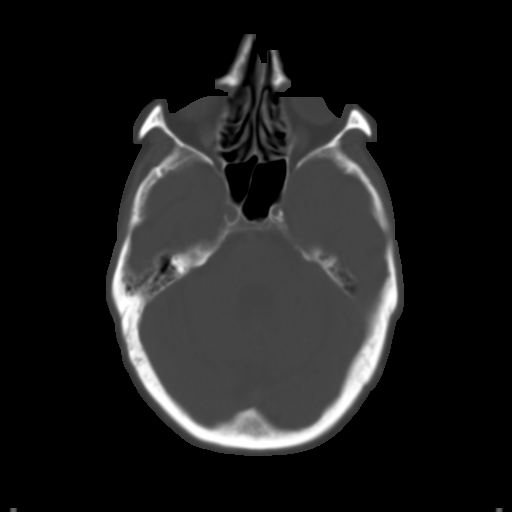
[im 11/32  brain]
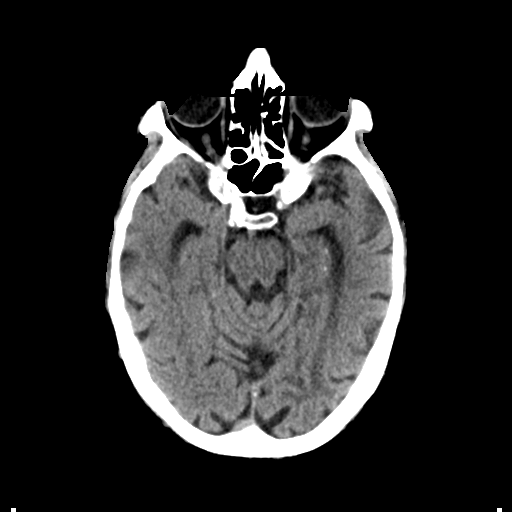
[im 13/32  brain]
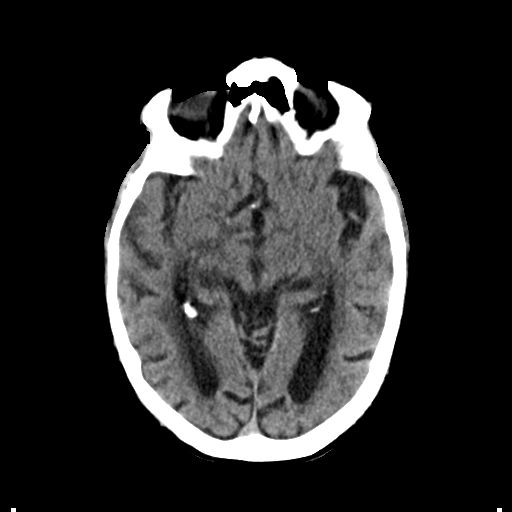
[im 15/32  brain]
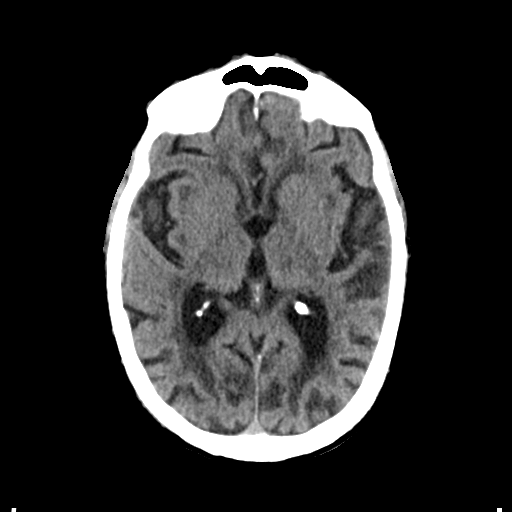
[im 17/32  brain]
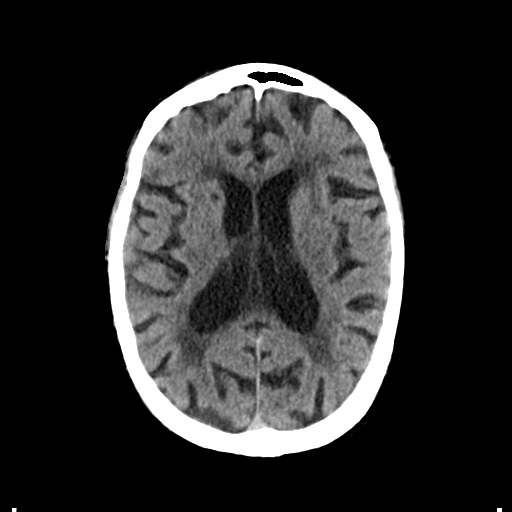
[im 17/32  bone]
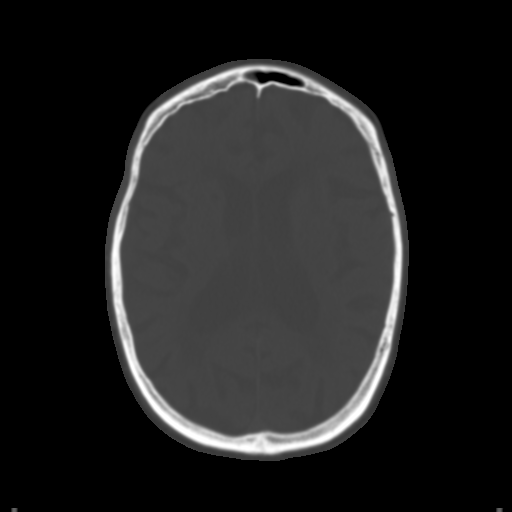
[im 19/32  brain]
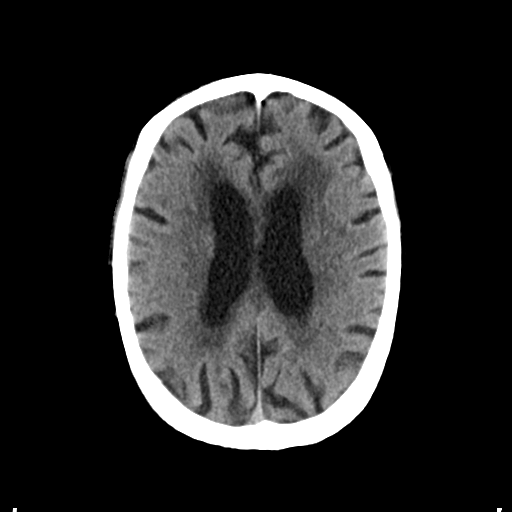
[im 21/32  brain]
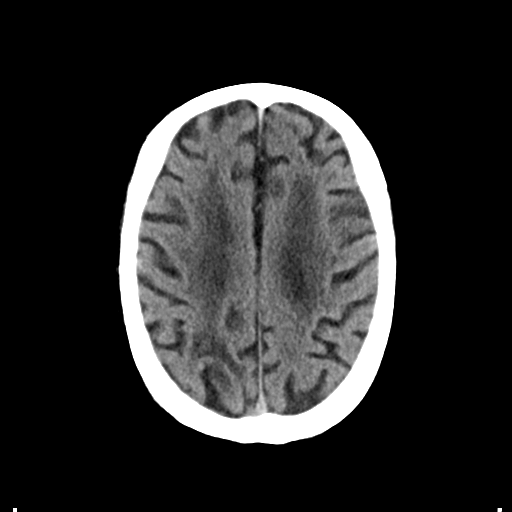
[im 23/32  brain]
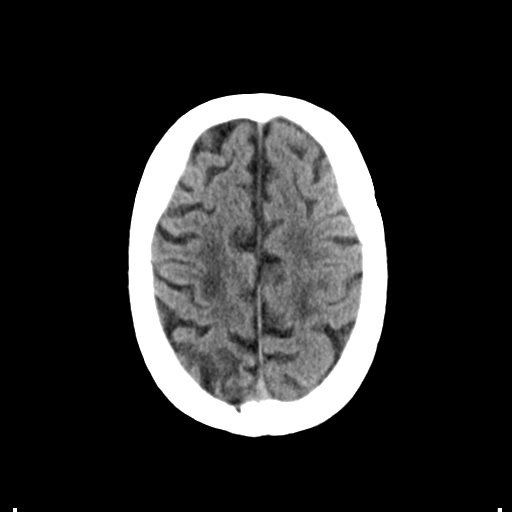
[im 24/32  brain]
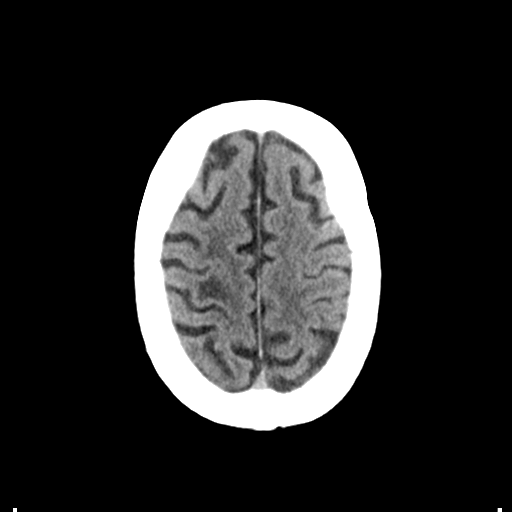
[im 24/32  bone]
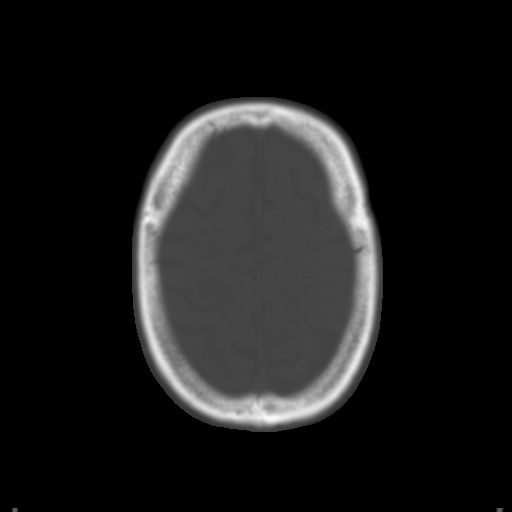
[im 26/32  brain]
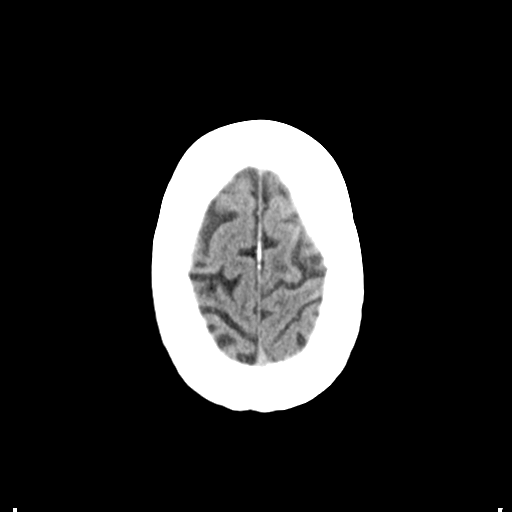
[im 28/32  brain]
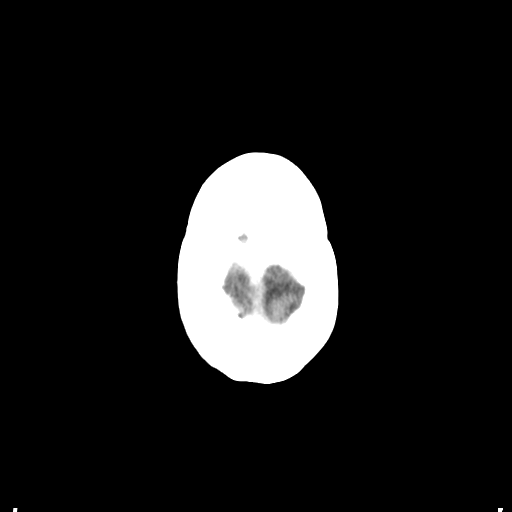
[im 30/32  brain]
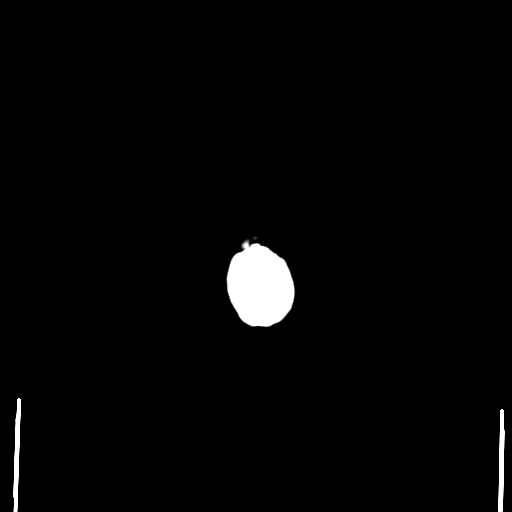

[16 of 30 positions shown; findings below may reference images not displayed]

FINDINGS: There is diffuse low attenuation throughout the subcortical and
periventricular white matter compatible with chronic microvascular
disease. Prominence of the sulci and the ventricles noted consistent
with brain atrophy. No abnormal extra-axial fluid collections,
intracranial hemorrhage or mass noted. There is mild mucosal
thickening involving the maxillary sinuses. The paranasal sinuses
are clear. The mastoid air cells appear clear. The calvarium is
intact.
IMPRESSION: 1. No acute intracranial abnormality.
2. Chronic microvascular disease and brain atrophy.
